# Patient Record
Sex: Male | Born: 1945 | Race: Black or African American | Hispanic: No | Marital: Married | Smoking: Former smoker
Health system: Southern US, Community
[De-identification: ages and names within clinical notes are randomized; demographics above are authoritative.]

## PROBLEM LIST (undated history)

## (undated) DIAGNOSIS — G20A1 Parkinson's disease without dyskinesia, without mention of fluctuations: Secondary | ICD-10-CM

## (undated) DIAGNOSIS — J3489 Other specified disorders of nose and nasal sinuses: Secondary | ICD-10-CM

## (undated) DIAGNOSIS — I10 Essential (primary) hypertension: Secondary | ICD-10-CM

## (undated) DIAGNOSIS — IMO0002 Reserved for concepts with insufficient information to code with codable children: Secondary | ICD-10-CM

## (undated) DIAGNOSIS — E119 Type 2 diabetes mellitus without complications: Secondary | ICD-10-CM

## (undated) DIAGNOSIS — M1990 Unspecified osteoarthritis, unspecified site: Secondary | ICD-10-CM

## (undated) DIAGNOSIS — B353 Tinea pedis: Secondary | ICD-10-CM

## (undated) DIAGNOSIS — C801 Malignant (primary) neoplasm, unspecified: Secondary | ICD-10-CM

## (undated) DIAGNOSIS — K5790 Diverticulosis of intestine, part unspecified, without perforation or abscess without bleeding: Secondary | ICD-10-CM

## (undated) DIAGNOSIS — E7800 Pure hypercholesterolemia, unspecified: Secondary | ICD-10-CM

## (undated) HISTORY — DX: Pure hypercholesterolemia, unspecified: E78.00

## (undated) HISTORY — PX: BACK SURGERY: SHX140

## (undated) HISTORY — DX: Diverticulosis of intestine, part unspecified, without perforation or abscess without bleeding: K57.90

## (undated) HISTORY — DX: Unspecified osteoarthritis, unspecified site: M19.90

## (undated) HISTORY — DX: Reserved for concepts with insufficient information to code with codable children: IMO0002

## (undated) HISTORY — DX: Tinea pedis: B35.3

---

## 1997-05-20 HISTORY — PX: EYE SURGERY: SHX253

## 2000-05-20 HISTORY — PX: PROSTATECTOMY: SHX69

## 2001-05-20 HISTORY — PX: DG DILATION URETERS: HXRAD351

## 2011-12-12 ENCOUNTER — Other Ambulatory Visit: Payer: Self-pay | Admitting: Neurosurgery

## 2011-12-12 DIAGNOSIS — M79606 Pain in leg, unspecified: Secondary | ICD-10-CM

## 2011-12-17 ENCOUNTER — Ambulatory Visit
Admission: RE | Admit: 2011-12-17 | Discharge: 2011-12-17 | Disposition: A | Discharge status: Home / Self Care | Payer: 59 | Source: Ambulatory Visit | Attending: Neurosurgery | Admitting: Neurosurgery

## 2011-12-17 VITALS — BP 101/49 | HR 67 | Ht 71.0 in | Wt 203.0 lb

## 2011-12-17 DIAGNOSIS — M79606 Pain in leg, unspecified: Secondary | ICD-10-CM

## 2011-12-17 MED ORDER — ONDANSETRON HCL 4 MG/2ML IJ SOLN: 4.0000 mg | Freq: Four times a day (QID) | INTRAMUSCULAR | Status: DC | PRN

## 2011-12-17 MED ORDER — IOHEXOL 180 MG/ML  SOLN
20.0000 mL | Freq: Once | INTRAMUSCULAR | Status: AC | PRN
  Administered 2011-12-17: 20 mL via INTRATHECAL

## 2011-12-17 MED ORDER — DIAZEPAM 5 MG PO TABS
10.0000 mg | ORAL_TABLET | Freq: Once | ORAL | Status: AC
  Administered 2011-12-17: 10 mg via ORAL

## 2011-12-17 NOTE — Progress Notes (Signed)
Pt states he has been off tramadol for the past 2 days. 

## 2012-10-20 DIAGNOSIS — H40019 Open angle with borderline findings, low risk, unspecified eye: Secondary | ICD-10-CM | POA: Diagnosis not present

## 2012-10-20 DIAGNOSIS — H52209 Unspecified astigmatism, unspecified eye: Secondary | ICD-10-CM | POA: Diagnosis not present

## 2012-10-20 DIAGNOSIS — E119 Type 2 diabetes mellitus without complications: Secondary | ICD-10-CM | POA: Diagnosis not present

## 2012-10-20 DIAGNOSIS — H2510 Age-related nuclear cataract, unspecified eye: Secondary | ICD-10-CM | POA: Diagnosis not present

## 2012-12-15 DIAGNOSIS — E119 Type 2 diabetes mellitus without complications: Secondary | ICD-10-CM | POA: Diagnosis not present

## 2012-12-15 DIAGNOSIS — Z125 Encounter for screening for malignant neoplasm of prostate: Secondary | ICD-10-CM | POA: Diagnosis not present

## 2012-12-15 DIAGNOSIS — E7800 Pure hypercholesterolemia, unspecified: Secondary | ICD-10-CM | POA: Diagnosis not present

## 2012-12-15 DIAGNOSIS — I10 Essential (primary) hypertension: Secondary | ICD-10-CM | POA: Diagnosis not present

## 2012-12-16 DIAGNOSIS — I10 Essential (primary) hypertension: Secondary | ICD-10-CM | POA: Diagnosis not present

## 2012-12-16 DIAGNOSIS — IMO0002 Reserved for concepts with insufficient information to code with codable children: Secondary | ICD-10-CM | POA: Diagnosis not present

## 2012-12-16 DIAGNOSIS — Z006 Encounter for examination for normal comparison and control in clinical research program: Secondary | ICD-10-CM | POA: Diagnosis not present

## 2012-12-16 DIAGNOSIS — Z0000 Encounter for general adult medical examination without abnormal findings: Secondary | ICD-10-CM | POA: Diagnosis not present

## 2012-12-16 DIAGNOSIS — M1710 Unilateral primary osteoarthritis, unspecified knee: Secondary | ICD-10-CM | POA: Diagnosis not present

## 2012-12-16 DIAGNOSIS — N529 Male erectile dysfunction, unspecified: Secondary | ICD-10-CM | POA: Diagnosis not present

## 2012-12-21 DIAGNOSIS — E119 Type 2 diabetes mellitus without complications: Secondary | ICD-10-CM | POA: Diagnosis not present

## 2012-12-21 DIAGNOSIS — Z8546 Personal history of malignant neoplasm of prostate: Secondary | ICD-10-CM | POA: Diagnosis not present

## 2012-12-21 DIAGNOSIS — M545 Low back pain, unspecified: Secondary | ICD-10-CM | POA: Diagnosis not present

## 2012-12-21 DIAGNOSIS — G250 Essential tremor: Secondary | ICD-10-CM | POA: Diagnosis not present

## 2013-03-09 DIAGNOSIS — I10 Essential (primary) hypertension: Secondary | ICD-10-CM | POA: Diagnosis not present

## 2013-04-26 DIAGNOSIS — Z0000 Encounter for general adult medical examination without abnormal findings: Secondary | ICD-10-CM | POA: Diagnosis not present

## 2013-04-26 DIAGNOSIS — E119 Type 2 diabetes mellitus without complications: Secondary | ICD-10-CM | POA: Diagnosis not present

## 2013-04-26 DIAGNOSIS — E7800 Pure hypercholesterolemia, unspecified: Secondary | ICD-10-CM | POA: Diagnosis not present

## 2013-04-27 DIAGNOSIS — E7800 Pure hypercholesterolemia, unspecified: Secondary | ICD-10-CM | POA: Diagnosis not present

## 2013-04-27 DIAGNOSIS — J310 Chronic rhinitis: Secondary | ICD-10-CM | POA: Diagnosis not present

## 2013-04-27 DIAGNOSIS — E119 Type 2 diabetes mellitus without complications: Secondary | ICD-10-CM | POA: Diagnosis not present

## 2013-04-27 DIAGNOSIS — I10 Essential (primary) hypertension: Secondary | ICD-10-CM | POA: Diagnosis not present

## 2013-04-27 DIAGNOSIS — R040 Epistaxis: Secondary | ICD-10-CM | POA: Diagnosis not present

## 2013-06-08 DIAGNOSIS — R05 Cough: Secondary | ICD-10-CM | POA: Diagnosis not present

## 2013-06-08 DIAGNOSIS — J329 Chronic sinusitis, unspecified: Secondary | ICD-10-CM | POA: Diagnosis not present

## 2013-06-08 DIAGNOSIS — R059 Cough, unspecified: Secondary | ICD-10-CM | POA: Diagnosis not present

## 2013-07-05 DIAGNOSIS — E663 Overweight: Secondary | ICD-10-CM | POA: Diagnosis not present

## 2013-07-05 DIAGNOSIS — I10 Essential (primary) hypertension: Secondary | ICD-10-CM | POA: Diagnosis not present

## 2013-07-05 DIAGNOSIS — M545 Low back pain, unspecified: Secondary | ICD-10-CM | POA: Diagnosis not present

## 2013-08-16 DIAGNOSIS — E119 Type 2 diabetes mellitus without complications: Secondary | ICD-10-CM | POA: Diagnosis not present

## 2013-08-19 DIAGNOSIS — E119 Type 2 diabetes mellitus without complications: Secondary | ICD-10-CM | POA: Diagnosis not present

## 2013-12-01 DIAGNOSIS — R059 Cough, unspecified: Secondary | ICD-10-CM | POA: Diagnosis not present

## 2013-12-01 DIAGNOSIS — J0110 Acute frontal sinusitis, unspecified: Secondary | ICD-10-CM | POA: Diagnosis not present

## 2013-12-01 DIAGNOSIS — Z6827 Body mass index (BMI) 27.0-27.9, adult: Secondary | ICD-10-CM | POA: Diagnosis not present

## 2013-12-01 DIAGNOSIS — R05 Cough: Secondary | ICD-10-CM | POA: Diagnosis not present

## 2013-12-31 DIAGNOSIS — E119 Type 2 diabetes mellitus without complications: Secondary | ICD-10-CM | POA: Diagnosis not present

## 2013-12-31 DIAGNOSIS — H2510 Age-related nuclear cataract, unspecified eye: Secondary | ICD-10-CM | POA: Diagnosis not present

## 2013-12-31 DIAGNOSIS — H5200 Hypermetropia, unspecified eye: Secondary | ICD-10-CM | POA: Diagnosis not present

## 2014-01-06 DIAGNOSIS — M545 Low back pain, unspecified: Secondary | ICD-10-CM | POA: Diagnosis not present

## 2014-01-06 DIAGNOSIS — I10 Essential (primary) hypertension: Secondary | ICD-10-CM | POA: Diagnosis not present

## 2014-01-06 DIAGNOSIS — E119 Type 2 diabetes mellitus without complications: Secondary | ICD-10-CM | POA: Diagnosis not present

## 2014-01-06 DIAGNOSIS — Z125 Encounter for screening for malignant neoplasm of prostate: Secondary | ICD-10-CM | POA: Diagnosis not present

## 2014-01-06 DIAGNOSIS — M5137 Other intervertebral disc degeneration, lumbosacral region: Secondary | ICD-10-CM | POA: Diagnosis not present

## 2014-01-06 DIAGNOSIS — Z0000 Encounter for general adult medical examination without abnormal findings: Secondary | ICD-10-CM | POA: Diagnosis not present

## 2014-01-06 DIAGNOSIS — E7800 Pure hypercholesterolemia, unspecified: Secondary | ICD-10-CM | POA: Diagnosis not present

## 2014-01-06 DIAGNOSIS — Z6826 Body mass index (BMI) 26.0-26.9, adult: Secondary | ICD-10-CM | POA: Diagnosis not present

## 2014-01-11 DIAGNOSIS — M545 Low back pain, unspecified: Secondary | ICD-10-CM | POA: Diagnosis not present

## 2014-01-11 DIAGNOSIS — E119 Type 2 diabetes mellitus without complications: Secondary | ICD-10-CM | POA: Diagnosis not present

## 2014-01-11 DIAGNOSIS — E7800 Pure hypercholesterolemia, unspecified: Secondary | ICD-10-CM | POA: Diagnosis not present

## 2014-01-11 DIAGNOSIS — Z8546 Personal history of malignant neoplasm of prostate: Secondary | ICD-10-CM | POA: Diagnosis not present

## 2014-01-12 ENCOUNTER — Other Ambulatory Visit: Payer: Self-pay | Admitting: Neurosurgery

## 2014-01-12 DIAGNOSIS — M5137 Other intervertebral disc degeneration, lumbosacral region: Secondary | ICD-10-CM

## 2014-01-31 ENCOUNTER — Ambulatory Visit
Admission: RE | Admit: 2014-01-31 | Discharge: 2014-01-31 | Disposition: A | Discharge status: Home / Self Care | Payer: Medicare Other | Source: Ambulatory Visit | Attending: Neurosurgery | Admitting: Neurosurgery

## 2014-01-31 VITALS — BP 137/100 | HR 67

## 2014-01-31 DIAGNOSIS — M47817 Spondylosis without myelopathy or radiculopathy, lumbosacral region: Secondary | ICD-10-CM | POA: Diagnosis not present

## 2014-01-31 DIAGNOSIS — M5137 Other intervertebral disc degeneration, lumbosacral region: Secondary | ICD-10-CM

## 2014-01-31 DIAGNOSIS — M4120 Other idiopathic scoliosis, site unspecified: Secondary | ICD-10-CM | POA: Diagnosis not present

## 2014-01-31 MED ORDER — DIAZEPAM 5 MG PO TABS
5.0000 mg | ORAL_TABLET | Freq: Once | ORAL | Status: AC
Start: 2014-01-31 — End: 2014-01-31
  Administered 2014-01-31: 5 mg via ORAL

## 2014-01-31 MED ORDER — IOHEXOL 180 MG/ML  SOLN
15.0000 mL | Freq: Once | INTRAMUSCULAR | Status: AC | PRN
  Administered 2014-01-31: 15 mL via INTRATHECAL

## 2014-01-31 NOTE — Discharge Instructions (Signed)

## 2014-02-03 DIAGNOSIS — M5137 Other intervertebral disc degeneration, lumbosacral region: Secondary | ICD-10-CM | POA: Diagnosis not present

## 2014-02-03 DIAGNOSIS — Z6826 Body mass index (BMI) 26.0-26.9, adult: Secondary | ICD-10-CM | POA: Diagnosis not present

## 2014-02-09 ENCOUNTER — Other Ambulatory Visit: Payer: Self-pay | Admitting: Neurosurgery

## 2014-02-09 DIAGNOSIS — M5136 Other intervertebral disc degeneration, lumbar region: Secondary | ICD-10-CM

## 2014-02-15 ENCOUNTER — Ambulatory Visit
Admission: RE | Admit: 2014-02-15 | Discharge: 2014-02-15 | Disposition: A | Discharge status: Home / Self Care | Payer: Medicare Other | Source: Ambulatory Visit | Attending: Neurosurgery | Admitting: Neurosurgery

## 2014-02-15 VITALS — BP 116/66 | HR 68

## 2014-02-15 DIAGNOSIS — M5126 Other intervertebral disc displacement, lumbar region: Secondary | ICD-10-CM

## 2014-02-15 DIAGNOSIS — M5136 Other intervertebral disc degeneration, lumbar region: Secondary | ICD-10-CM

## 2014-02-15 DIAGNOSIS — M545 Low back pain, unspecified: Secondary | ICD-10-CM | POA: Diagnosis not present

## 2014-02-15 DIAGNOSIS — IMO0002 Reserved for concepts with insufficient information to code with codable children: Secondary | ICD-10-CM | POA: Diagnosis not present

## 2014-02-15 MED ORDER — IOHEXOL 180 MG/ML  SOLN
1.0000 mL | Freq: Once | INTRAMUSCULAR | Status: AC | PRN
  Administered 2014-02-15: 1 mL via EPIDURAL

## 2014-02-15 MED ORDER — METHYLPREDNISOLONE ACETATE 40 MG/ML INJ SUSP (RADIOLOG
120.0000 mg | Freq: Once | INTRAMUSCULAR | Status: AC
  Administered 2014-02-15: 120 mg via EPIDURAL

## 2014-02-15 NOTE — Discharge Instructions (Signed)

## 2014-03-08 ENCOUNTER — Other Ambulatory Visit: Payer: Self-pay | Admitting: Neurosurgery

## 2014-03-08 DIAGNOSIS — M5136 Other intervertebral disc degeneration, lumbar region: Secondary | ICD-10-CM

## 2014-03-10 ENCOUNTER — Ambulatory Visit
Admission: RE | Admit: 2014-03-10 | Discharge: 2014-03-10 | Disposition: A | Discharge status: Home / Self Care | Payer: Medicare Other | Source: Ambulatory Visit | Attending: Neurosurgery | Admitting: Neurosurgery

## 2014-03-10 DIAGNOSIS — M545 Low back pain: Secondary | ICD-10-CM | POA: Diagnosis not present

## 2014-03-10 DIAGNOSIS — M5136 Other intervertebral disc degeneration, lumbar region: Secondary | ICD-10-CM

## 2014-03-10 MED ORDER — METHYLPREDNISOLONE ACETATE 40 MG/ML INJ SUSP (RADIOLOG
120.0000 mg | Freq: Once | INTRAMUSCULAR | Status: AC
  Administered 2014-03-10: 120 mg via EPIDURAL

## 2014-03-10 MED ORDER — IOHEXOL 180 MG/ML  SOLN
1.0000 mL | Freq: Once | INTRAMUSCULAR | Status: AC | PRN
  Administered 2014-03-10: 1 mL via EPIDURAL

## 2014-03-10 NOTE — Discharge Instructions (Signed)

## 2014-03-11 DIAGNOSIS — Z23 Encounter for immunization: Secondary | ICD-10-CM | POA: Diagnosis not present

## 2014-04-21 DIAGNOSIS — M5136 Other intervertebral disc degeneration, lumbar region: Secondary | ICD-10-CM | POA: Diagnosis not present

## 2014-04-21 DIAGNOSIS — Z6827 Body mass index (BMI) 27.0-27.9, adult: Secondary | ICD-10-CM | POA: Diagnosis not present

## 2014-04-25 ENCOUNTER — Other Ambulatory Visit: Payer: Self-pay | Admitting: Neurosurgery

## 2014-05-02 DIAGNOSIS — E119 Type 2 diabetes mellitus without complications: Secondary | ICD-10-CM | POA: Diagnosis not present

## 2014-05-02 DIAGNOSIS — I10 Essential (primary) hypertension: Secondary | ICD-10-CM | POA: Diagnosis not present

## 2014-05-02 DIAGNOSIS — M545 Low back pain: Secondary | ICD-10-CM | POA: Diagnosis not present

## 2014-05-02 DIAGNOSIS — E118 Type 2 diabetes mellitus with unspecified complications: Secondary | ICD-10-CM | POA: Diagnosis not present

## 2014-05-02 DIAGNOSIS — E780 Pure hypercholesterolemia: Secondary | ICD-10-CM | POA: Diagnosis not present

## 2014-05-24 ENCOUNTER — Encounter (HOSPITAL_COMMUNITY): Payer: Self-pay

## 2014-05-24 ENCOUNTER — Encounter (HOSPITAL_COMMUNITY)
Admission: RE | Admit: 2014-05-24 | Discharge: 2014-05-24 | Disposition: A | Discharge status: Home / Self Care | Payer: Medicare Other | Source: Ambulatory Visit | Attending: Neurosurgery | Admitting: Neurosurgery

## 2014-05-24 DIAGNOSIS — Z01818 Encounter for other preprocedural examination: Secondary | ICD-10-CM | POA: Diagnosis not present

## 2014-05-24 HISTORY — DX: Type 2 diabetes mellitus without complications: E11.9

## 2014-05-24 HISTORY — DX: Other specified disorders of nose and nasal sinuses: J34.89

## 2014-05-24 HISTORY — DX: Essential (primary) hypertension: I10

## 2014-05-24 HISTORY — DX: Malignant (primary) neoplasm, unspecified: C80.1

## 2014-05-24 HISTORY — DX: Unspecified osteoarthritis, unspecified site: M19.90

## 2014-05-24 LAB — TYPE AND SCREEN
ABO/RH(D): B POS
ANTIBODY SCREEN: NEGATIVE

## 2014-05-24 LAB — BASIC METABOLIC PANEL
ANION GAP: 10 (ref 5–15)
BUN: 13 mg/dL (ref 6–23)
CO2: 28 mmol/L (ref 19–32)
Calcium: 10.1 mg/dL (ref 8.4–10.5)
Chloride: 101 mEq/L (ref 96–112)
Creatinine, Ser: 1.19 mg/dL (ref 0.50–1.35)
GFR calc non Af Amer: 62 mL/min — ABNORMAL LOW (ref >90)
GFR, EST AFRICAN AMERICAN: 72 mL/min — AB (ref >90)
Glucose, Bld: 103 mg/dL — ABNORMAL HIGH (ref 70–99)
Potassium: 4 mmol/L (ref 3.5–5.1)
SODIUM: 139 mmol/L (ref 135–145)

## 2014-05-24 LAB — CBC
HEMATOCRIT: 45.5 % (ref 39.0–52.0)
HEMOGLOBIN: 14.7 g/dL (ref 13.0–17.0)
MCH: 29.5 pg (ref 26.0–34.0)
MCHC: 32.3 g/dL (ref 30.0–36.0)
MCV: 91.2 fL (ref 78.0–100.0)
Platelets: 207 10*3/uL (ref 150–400)
RBC: 4.99 MIL/uL (ref 4.22–5.81)
RDW: 12.1 % (ref 11.5–15.5)
WBC: 6.9 10*3/uL (ref 4.0–10.5)

## 2014-05-24 LAB — SURGICAL PCR SCREEN
MRSA, PCR: NEGATIVE
Staphylococcus aureus: NEGATIVE

## 2014-05-24 LAB — ABO/RH: ABO/RH(D): B POS

## 2014-05-24 NOTE — Pre-Procedure Instructions (Signed)
Aleczander Fandino  05/24/2014   Your procedure is scheduled on:  Wednesday, June 01, 2014  Report to [facility] Admitting at 6:30 AM.  Call this number if you have problems the morning of surgery: [phone]   Remember:   Do not eat food or drink liquids after midnight Tuesday, May 31, 2014   Take these medicines the morning of surgery with A SIP OF WATER: if needed: traMADol (ULTRAM)   for pain, fluticasone (FLONASE)  nasal spray for allergies   DO NOT TAKE ANY DIABETIC MEDICATION THE MORNING OF PROCEDURE such as  (Canagliflozin-Metformin HCl (INVOKAMET)  Stop taking Aspirin, vitamins, and herbal medications. Do not take any NSAIDs ie: Ibuprofen, Advil, Naproxen or any medication containing Aspirin such as Meloxicam (MOBIC); stop 1 week prior to procedure ( Wednesday, May 25, 2014).   Do not wear jewelry, make-up or nail polish.  Do not wear lotions, powders, or perfumes. You may not wear deodorant.  Do not shave 48 hours prior to surgery. Men may shave face and neck.  Do not bring valuables to the hospital.  [facility] is not responsible for any belongings or valuables.               Contacts, dentures or bridgework may not be worn into surgery.  Leave suitcase in the car. After surgery it may be brought to your room.  For patients admitted to the hospital, discharge time is determined by your treatment team.               Patients discharged the day of surgery will not be allowed to drive home.  Name and phone number of your driver:   Special Instructions:  Special Instructions:Special Instructions: [facility] - Preparing for Surgery  Before surgery, you can play an important role.  Because skin is not sterile, your skin needs to be as free of germs as possible.  You can reduce the number of germs on you skin by washing with CHG (chlorahexidine gluconate) soap before surgery.  CHG is an antiseptic cleaner which kills germs and bonds with the skin to continue  killing germs even after washing.  Please DO NOT use if you have an allergy to CHG or antibacterial soaps.  If your skin becomes reddened/irritated stop using the CHG and inform your nurse when you arrive at Short Stay.  Do not shave (including legs and underarms) for at least 48 hours prior to the first CHG shower.  You may shave your face.  Please follow these instructions carefully:   1.  Shower with CHG Soap the night before surgery and the morning of Surgery.  2.  If you choose to wash your hair, wash your hair first as usual with your normal shampoo.  3.  After you shampoo, rinse your hair and body thoroughly to remove the Shampoo.  4.  Use CHG as you would any other liquid soap.  You can apply chg directly  to the skin and wash gently with scrungie or a clean washcloth.  5.  Apply the CHG Soap to your body ONLY FROM THE NECK DOWN.  Do not use on open wounds or open sores.  Avoid contact with your eyes, ears, mouth and genitals (private parts).  Wash genitals (private parts) with your normal soap.  6.  Wash thoroughly, paying special attention to the area where your surgery will be performed.  7.  Thoroughly rinse your body with warm water from the neck down.  8.  DO NOT shower/wash with your normal soap after using and rinsing off the CHG Soap.  9.  Pat yourself dry with a clean towel.            10.  Wear clean pajamas.            11.  Place clean sheets on your bed the night of your first shower and do not sleep with pets.  Day of Surgery  Do not apply any lotions/deodorants the morning of surgery.  Please wear clean clothes to the hospital/surgery center.   Please read over the following fact sheets that you were given: Pain Booklet, Coughing and Deep Breathing, Blood Transfusion Information, MRSA Information and Surgical Site Infection Prevention

## 2014-05-31 MED ORDER — CEFAZOLIN SODIUM-DEXTROSE 2-3 GM-% IV SOLR
2.0000 g | INTRAVENOUS | Status: AC
  Administered 2014-06-01: 2 g via INTRAVENOUS
  Filled 2014-05-31: qty 50

## 2014-05-31 NOTE — H&P (Signed)
Tyler Hayes is an 69 y.o. male.   Chief Complaint: chronic lumbar pain HPI: patient who in the past underwent lumbar laminectomy in [place]. Off and on he has been complaining of lumbar pain with radiation yo both legs.  Lately he is having burning sensation and now is up to the point than ambulating about 10 feet increases the  Pain . Epidural injections has been of no help Past Medical History  Diagnosis Date  . Hypertension   . Sinus drainage   . Diabetes mellitus without complication     on meds  . Arthritis     back  . Cancer     prostate    Past Surgical History  Procedure Laterality Date  . Prostatectomy  2002  . Eye surgery Bilateral 1999    Lasik  . Back surgery  1984, 2013    lumbar  . Dg dilation ureters  2003    No family history on file. Social History:  reports that he quit smoking about 23 years ago. His smoking use included Cigarettes. He has a 30 pack-year smoking history. He has never used smokeless tobacco. He reports that he drinks alcohol. He reports that he does not use illicit drugs.  Allergies: No Known Allergies  No prescriptions prior to admission    No results found for this or any previous visit (from the past 48 hour(s)). No results found.  Review of Systems  Constitutional: Negative.   HENT: Negative.   Eyes: Negative.   Respiratory: Negative.   Cardiovascular: Negative.   Gastrointestinal: Negative.   Genitourinary: Negative.   Musculoskeletal: Positive for back pain.  Skin: Negative.   Neurological: Positive for sensory change and focal weakness.  Endo/Heme/Allergies: Negative.   Psychiatric/Behavioral: Negative.     There were no vitals taken for this visit. Physical Exam  Hent, nl. Neck, nl. Cv, nl. Lungs, clear. Abdomen, soft. Extremities, nl. NEUR0 decrease of lumbar flexibility.femoral stretch maveuver is positive bilaterally.SLR positive at 60 degrees. 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 Multiple facet arthropathy Assessment/Plan Patient to go ahead with decompression tomorrow at l34,45 5s1. A decision is to be made about the need of fusion based on the surgical findings.althought he is schedule for fusion if i can avoid it it will be better for him  Adamariz Gillott M 05/31/2014, 5:53 PM

## 2014-06-01 ENCOUNTER — Encounter (HOSPITAL_COMMUNITY): Payer: Self-pay | Admitting: Certified Registered"

## 2014-06-01 ENCOUNTER — Inpatient Hospital Stay (HOSPITAL_COMMUNITY): Payer: Medicare Other | Admitting: Certified Registered"

## 2014-06-01 ENCOUNTER — Inpatient Hospital Stay (HOSPITAL_COMMUNITY)
Admission: RE | Admit: 2014-06-01 | Discharge: 2014-06-04 | DRG: 460 | Disposition: A | Discharge status: Home / Self Care | Payer: Medicare Other | Source: Ambulatory Visit | Attending: Neurosurgery | Admitting: Neurosurgery

## 2014-06-01 ENCOUNTER — Inpatient Hospital Stay (HOSPITAL_COMMUNITY): Payer: Medicare Other

## 2014-06-01 ENCOUNTER — Encounter (HOSPITAL_COMMUNITY)
Admission: RE | Disposition: A | Discharge status: Home / Self Care | Payer: Self-pay | Source: Ambulatory Visit | Attending: Neurosurgery

## 2014-06-01 DIAGNOSIS — M1990 Unspecified osteoarthritis, unspecified site: Secondary | ICD-10-CM | POA: Diagnosis present

## 2014-06-01 DIAGNOSIS — M4326 Fusion of spine, lumbar region: Secondary | ICD-10-CM | POA: Diagnosis not present

## 2014-06-01 DIAGNOSIS — I10 Essential (primary) hypertension: Secondary | ICD-10-CM | POA: Diagnosis not present

## 2014-06-01 DIAGNOSIS — M419 Scoliosis, unspecified: Secondary | ICD-10-CM | POA: Diagnosis present

## 2014-06-01 DIAGNOSIS — M5136 Other intervertebral disc degeneration, lumbar region: Secondary | ICD-10-CM | POA: Diagnosis present

## 2014-06-01 DIAGNOSIS — Z79899 Other long term (current) drug therapy: Secondary | ICD-10-CM | POA: Diagnosis not present

## 2014-06-01 DIAGNOSIS — M545 Low back pain: Secondary | ICD-10-CM | POA: Diagnosis not present

## 2014-06-01 DIAGNOSIS — Z8546 Personal history of malignant neoplasm of prostate: Secondary | ICD-10-CM | POA: Diagnosis not present

## 2014-06-01 DIAGNOSIS — M5126 Other intervertebral disc displacement, lumbar region: Secondary | ICD-10-CM | POA: Diagnosis not present

## 2014-06-01 DIAGNOSIS — Z87891 Personal history of nicotine dependence: Secondary | ICD-10-CM

## 2014-06-01 DIAGNOSIS — M5116 Intervertebral disc disorders with radiculopathy, lumbar region: Secondary | ICD-10-CM | POA: Diagnosis not present

## 2014-06-01 DIAGNOSIS — Z9889 Other specified postprocedural states: Secondary | ICD-10-CM | POA: Diagnosis not present

## 2014-06-01 DIAGNOSIS — M4156 Other secondary scoliosis, lumbar region: Secondary | ICD-10-CM | POA: Diagnosis not present

## 2014-06-01 DIAGNOSIS — M51369 Other intervertebral disc degeneration, lumbar region without mention of lumbar back pain or lower extremity pain: Secondary | ICD-10-CM | POA: Diagnosis present

## 2014-06-01 DIAGNOSIS — M4800 Spinal stenosis, site unspecified: Secondary | ICD-10-CM | POA: Diagnosis present

## 2014-06-01 DIAGNOSIS — E119 Type 2 diabetes mellitus without complications: Secondary | ICD-10-CM | POA: Diagnosis present

## 2014-06-01 DIAGNOSIS — M5117 Intervertebral disc disorders with radiculopathy, lumbosacral region: Secondary | ICD-10-CM | POA: Diagnosis not present

## 2014-06-01 HISTORY — PX: POSTERIOR LUMBAR FUSION 4 LEVEL: SHX6037

## 2014-06-01 LAB — GLUCOSE, CAPILLARY
GLUCOSE-CAPILLARY: 115 mg/dL — AB (ref 70–99)
GLUCOSE-CAPILLARY: 132 mg/dL — AB (ref 70–99)
GLUCOSE-CAPILLARY: 101 mg/dL — AB (ref 70–99)

## 2014-06-01 SURGERY — POSTERIOR LUMBAR FUSION 4 LEVEL
Anesthesia: General

## 2014-06-01 MED ORDER — PROPOFOL 10 MG/ML IV BOLUS
INTRAVENOUS | Status: DC | PRN
  Administered 2014-06-01: 160 mg via INTRAVENOUS

## 2014-06-01 MED ORDER — SUFENTANIL CITRATE 50 MCG/ML IV SOLN
INTRAVENOUS | Status: AC
  Filled 2014-06-01: qty 1

## 2014-06-01 MED ORDER — VITAMIN D 1000 UNITS PO TABS
1000.0000 [IU] | ORAL_TABLET | Freq: Every day | ORAL | Status: DC
  Administered 2014-06-02 – 2014-06-04 (×3): 1000 [IU] via ORAL
  Filled 2014-06-01 (×3): qty 1

## 2014-06-01 MED ORDER — ZOLPIDEM TARTRATE 5 MG PO TABS: 5.0000 mg | ORAL_TABLET | Freq: Every evening | ORAL | Status: DC | PRN

## 2014-06-01 MED ORDER — HYDROMORPHONE HCL 1 MG/ML IJ SOLN
0.2500 mg | INTRAMUSCULAR | Status: DC | PRN
  Administered 2014-06-01 (×4): 0.5 mg via INTRAVENOUS

## 2014-06-01 MED ORDER — LACTATED RINGERS IV SOLN
INTRAVENOUS | Status: DC | PRN
  Administered 2014-06-01 (×3): via INTRAVENOUS

## 2014-06-01 MED ORDER — ONDANSETRON HCL 4 MG/2ML IJ SOLN: 4.0000 mg | INTRAMUSCULAR | Status: DC | PRN

## 2014-06-01 MED ORDER — DEXAMETHASONE SODIUM PHOSPHATE 4 MG/ML IJ SOLN
INTRAMUSCULAR | Status: DC | PRN
  Administered 2014-06-01: 4 mg via INTRAVENOUS

## 2014-06-01 MED ORDER — SODIUM CHLORIDE 0.9 % IJ SOLN: 9.0000 mL | INTRAMUSCULAR | Status: DC | PRN

## 2014-06-01 MED ORDER — DEXTROSE 5 % IV SOLN
10.0000 mg | INTRAVENOUS | Status: DC | PRN
  Administered 2014-06-01: 20 ug/min via INTRAVENOUS

## 2014-06-01 MED ORDER — EPHEDRINE SULFATE 50 MG/ML IJ SOLN
INTRAMUSCULAR | Status: DC | PRN
  Administered 2014-06-01: 10 mg via INTRAVENOUS

## 2014-06-01 MED ORDER — ATORVASTATIN CALCIUM 10 MG PO TABS
10.0000 mg | ORAL_TABLET | Freq: Every day | ORAL | Status: DC
Start: 2014-06-01 — End: 2014-06-04
  Administered 2014-06-02 – 2014-06-04 (×3): 10 mg via ORAL
  Filled 2014-06-01 (×5): qty 1

## 2014-06-01 MED ORDER — DIAZEPAM 5 MG PO TABS
10.0000 mg | ORAL_TABLET | Freq: Four times a day (QID) | ORAL | Status: DC | PRN
  Administered 2014-06-01 – 2014-06-04 (×4): 10 mg via ORAL
  Filled 2014-06-01 (×6): qty 2

## 2014-06-01 MED ORDER — VECURONIUM BROMIDE 10 MG IV SOLR
INTRAVENOUS | Status: DC | PRN
  Administered 2014-06-01: 7 mg via INTRAVENOUS
  Administered 2014-06-01: 3 mg via INTRAVENOUS

## 2014-06-01 MED ORDER — VECURONIUM BROMIDE 10 MG IV SOLR
INTRAVENOUS | Status: AC
  Filled 2014-06-01: qty 10

## 2014-06-01 MED ORDER — VANCOMYCIN HCL 1000 MG IV SOLR
INTRAVENOUS | Status: AC
  Filled 2014-06-01: qty 2000

## 2014-06-01 MED ORDER — MIDAZOLAM HCL 5 MG/5ML IJ SOLN
INTRAMUSCULAR | Status: DC | PRN
  Administered 2014-06-01: 2 mg via INTRAVENOUS

## 2014-06-01 MED ORDER — LIDOCAINE HCL (CARDIAC) 20 MG/ML IV SOLN
INTRAVENOUS | Status: AC
  Filled 2014-06-01: qty 5

## 2014-06-01 MED ORDER — DIPHENHYDRAMINE HCL 12.5 MG/5ML PO ELIX: 12.5000 mg | ORAL_SOLUTION | Freq: Four times a day (QID) | ORAL | Status: DC | PRN

## 2014-06-01 MED ORDER — SODIUM CHLORIDE 0.9 % IJ SOLN
INTRAMUSCULAR | Status: AC
  Filled 2014-06-01: qty 10

## 2014-06-01 MED ORDER — LIDOCAINE HCL (CARDIAC) 20 MG/ML IV SOLN
INTRAVENOUS | Status: DC | PRN
  Administered 2014-06-01: 75 mg via INTRAVENOUS

## 2014-06-01 MED ORDER — ONDANSETRON HCL 4 MG/2ML IJ SOLN: 4.0000 mg | Freq: Four times a day (QID) | INTRAMUSCULAR | Status: DC | PRN

## 2014-06-01 MED ORDER — 0.9 % SODIUM CHLORIDE (POUR BTL) OPTIME
TOPICAL | Status: DC | PRN
  Administered 2014-06-01: 1000 mL

## 2014-06-01 MED ORDER — METFORMIN HCL 500 MG PO TABS
1000.0000 mg | ORAL_TABLET | Freq: Two times a day (BID) | ORAL | Status: DC
Start: 2014-06-01 — End: 2014-06-04
  Administered 2014-06-01 – 2014-06-04 (×6): 1000 mg via ORAL
  Filled 2014-06-01 (×6): qty 2

## 2014-06-01 MED ORDER — SODIUM CHLORIDE 0.9 % IV SOLN
INTRAVENOUS | Status: DC
  Administered 2014-06-01: 23:00:00 via INTRAVENOUS

## 2014-06-01 MED ORDER — SURGIFOAM 100 EX MISC
CUTANEOUS | Status: DC | PRN
  Administered 2014-06-01: 10:00:00 via TOPICAL

## 2014-06-01 MED ORDER — SUCCINYLCHOLINE CHLORIDE 20 MG/ML IJ SOLN
INTRAMUSCULAR | Status: AC
  Filled 2014-06-01: qty 1

## 2014-06-01 MED ORDER — ACETAMINOPHEN 650 MG RE SUPP: 650.0000 mg | RECTAL | Status: DC | PRN

## 2014-06-01 MED ORDER — HYDROCHLOROTHIAZIDE 25 MG PO TABS
25.0000 mg | ORAL_TABLET | Freq: Every day | ORAL | Status: DC
Start: 2014-06-01 — End: 2014-06-04
  Administered 2014-06-02 – 2014-06-04 (×3): 25 mg via ORAL
  Filled 2014-06-01 (×4): qty 1

## 2014-06-01 MED ORDER — ONDANSETRON HCL 4 MG/2ML IJ SOLN
INTRAMUSCULAR | Status: DC | PRN
  Administered 2014-06-01: 4 mg via INTRAVENOUS

## 2014-06-01 MED ORDER — ONDANSETRON HCL 4 MG/2ML IJ SOLN
INTRAMUSCULAR | Status: AC
  Filled 2014-06-01: qty 2

## 2014-06-01 MED ORDER — VITAMIN D-3 25 MCG (1000 UT) PO CAPS: 1000.0000 [IU] | ORAL_CAPSULE | Freq: Every day | ORAL | Status: DC

## 2014-06-01 MED ORDER — BUPIVACAINE LIPOSOME 1.3 % IJ SUSP
INTRAMUSCULAR | Status: DC | PRN
Start: 2014-06-01 — End: 2014-06-01
  Administered 2014-06-01: 20 mL

## 2014-06-01 MED ORDER — HYDROMORPHONE HCL 1 MG/ML IJ SOLN
INTRAMUSCULAR | Status: AC
  Filled 2014-06-01: qty 1

## 2014-06-01 MED ORDER — STERILE WATER FOR INJECTION IJ SOLN
INTRAMUSCULAR | Status: AC
  Filled 2014-06-01: qty 10

## 2014-06-01 MED ORDER — BUPIVACAINE LIPOSOME 1.3 % IJ SUSP
20.0000 mL | Freq: Once | INTRAMUSCULAR | Status: DC
  Filled 2014-06-01: qty 20

## 2014-06-01 MED ORDER — SODIUM CHLORIDE 0.9 % IJ SOLN: 3.0000 mL | INTRAMUSCULAR | Status: DC | PRN

## 2014-06-01 MED ORDER — THROMBIN 5000 UNITS EX SOLR
OROMUCOSAL | Status: DC | PRN
  Administered 2014-06-01: 09:00:00 via TOPICAL

## 2014-06-01 MED ORDER — OXYCODONE-ACETAMINOPHEN 5-325 MG PO TABS
2.0000 | ORAL_TABLET | ORAL | Status: DC | PRN
  Administered 2014-06-02 – 2014-06-04 (×8): 2 via ORAL
  Filled 2014-06-01 (×8): qty 2

## 2014-06-01 MED ORDER — DIPHENHYDRAMINE HCL 50 MG/ML IJ SOLN: 12.5000 mg | Freq: Four times a day (QID) | INTRAMUSCULAR | Status: DC | PRN

## 2014-06-01 MED ORDER — LOSARTAN POTASSIUM 50 MG PO TABS
100.0000 mg | ORAL_TABLET | Freq: Every day | ORAL | Status: DC
Start: 2014-06-01 — End: 2014-06-04
  Administered 2014-06-02 – 2014-06-04 (×3): 100 mg via ORAL
  Filled 2014-06-01 (×3): qty 2

## 2014-06-01 MED ORDER — CANAGLIFLOZIN 100 MG PO TABS
50.0000 mg | ORAL_TABLET | Freq: Two times a day (BID) | ORAL | Status: DC
Start: 2014-06-01 — End: 2014-06-04
  Administered 2014-06-01 – 2014-06-04 (×6): 50 mg via ORAL
  Filled 2014-06-01 (×8): qty 0.5

## 2014-06-01 MED ORDER — PHENYLEPHRINE HCL 10 MG/ML IJ SOLN
INTRAMUSCULAR | Status: AC
  Filled 2014-06-01: qty 1

## 2014-06-01 MED ORDER — ACETAMINOPHEN 325 MG PO TABS: 650.0000 mg | ORAL_TABLET | ORAL | Status: DC | PRN

## 2014-06-01 MED ORDER — LOSARTAN POTASSIUM-HCTZ 100-25 MG PO TABS: 1.0000 | ORAL_TABLET | Freq: Every day | ORAL | Status: DC

## 2014-06-01 MED ORDER — PHENYLEPHRINE HCL 10 MG/ML IJ SOLN
INTRAMUSCULAR | Status: DC | PRN
  Administered 2014-06-01: 80 ug via INTRAVENOUS
  Administered 2014-06-01 (×2): 40 ug via INTRAVENOUS
  Administered 2014-06-01: 80 ug via INTRAVENOUS

## 2014-06-01 MED ORDER — FENTANYL 10 MCG/ML IV SOLN
INTRAVENOUS | Status: DC
  Administered 2014-06-01 (×2): 90 ug via INTRAVENOUS
  Administered 2014-06-01: 14:00:00 via INTRAVENOUS
  Administered 2014-06-02: 90 ug via INTRAVENOUS
  Administered 2014-06-02: 135 ug via INTRAVENOUS
  Administered 2014-06-02: 180 ug via INTRAVENOUS
  Administered 2014-06-02: 150 ug via INTRAVENOUS
  Administered 2014-06-02: 0 ug via INTRAVENOUS
  Filled 2014-06-01 (×3): qty 50

## 2014-06-01 MED ORDER — SODIUM CHLORIDE 0.9 % IJ SOLN
3.0000 mL | Freq: Two times a day (BID) | INTRAMUSCULAR | Status: DC
  Administered 2014-06-03 – 2014-06-04 (×3): 3 mL via INTRAVENOUS

## 2014-06-01 MED ORDER — MENTHOL 3 MG MT LOZG
1.0000 | LOZENGE | OROMUCOSAL | Status: DC | PRN
  Filled 2014-06-01: qty 9

## 2014-06-01 MED ORDER — VANCOMYCIN HCL 1000 MG IV SOLR
INTRAVENOUS | Status: DC | PRN
  Administered 2014-06-01 (×2): 1000 mg

## 2014-06-01 MED ORDER — CEFAZOLIN SODIUM 1-5 GM-% IV SOLN
1.0000 g | Freq: Three times a day (TID) | INTRAVENOUS | Status: AC
  Administered 2014-06-01 (×2): 1 g via INTRAVENOUS
  Filled 2014-06-01 (×2): qty 50

## 2014-06-01 MED ORDER — PROPOFOL 10 MG/ML IV BOLUS
INTRAVENOUS | Status: AC
  Filled 2014-06-01: qty 20

## 2014-06-01 MED ORDER — CANAGLIFLOZIN-METFORMIN HCL 50-1000 MG PO TABS: 1.0000 | ORAL_TABLET | Freq: Two times a day (BID) | ORAL | Status: DC

## 2014-06-01 MED ORDER — MIDAZOLAM HCL 2 MG/2ML IJ SOLN
INTRAMUSCULAR | Status: AC
  Filled 2014-06-01: qty 2

## 2014-06-01 MED ORDER — PHENOL 1.4 % MT LIQD
1.0000 | OROMUCOSAL | Status: DC | PRN
  Filled 2014-06-01: qty 177

## 2014-06-01 MED ORDER — EPHEDRINE SULFATE 50 MG/ML IJ SOLN
INTRAMUSCULAR | Status: AC
  Filled 2014-06-01: qty 1

## 2014-06-01 MED ORDER — SUFENTANIL CITRATE 50 MCG/ML IV SOLN
INTRAVENOUS | Status: DC | PRN
  Administered 2014-06-01 (×2): 10 ug via INTRAVENOUS
  Administered 2014-06-01: 20 ug via INTRAVENOUS
  Administered 2014-06-01: 10 ug via INTRAVENOUS

## 2014-06-01 MED ORDER — NALOXONE HCL 0.4 MG/ML IJ SOLN: 0.4000 mg | INTRAMUSCULAR | Status: DC | PRN

## 2014-06-01 MED ORDER — FLUTICASONE PROPIONATE 50 MCG/ACT NA SUSP
1.0000 | Freq: Every day | NASAL | Status: DC | PRN
  Filled 2014-06-01: qty 16

## 2014-06-01 SURGICAL SUPPLY — 68 items
BENZOIN TINCTURE PRP APPL 2/3 (GAUZE/BANDAGES/DRESSINGS) ×2 IMPLANT
BLADE CLIPPER SURG (BLADE) ×2 IMPLANT
BUR ACORN 6.0 (BURR) ×2 IMPLANT
BUR MATCHSTICK NEURO 3.0 LAGG (BURR) ×2 IMPLANT
CANISTER SUCT 3000ML (MISCELLANEOUS) ×2 IMPLANT
CAP LOCKING THREADED (Cap) ×4 IMPLANT
CONT SPEC 4OZ CLIKSEAL STRL BL (MISCELLANEOUS) ×2 IMPLANT
COVER BACK TABLE 60X90IN (DRAPES) ×2 IMPLANT
DRAPE C-ARM 42X72 X-RAY (DRAPES) ×4 IMPLANT
DRAPE LAPAROTOMY 100X72X124 (DRAPES) ×2 IMPLANT
DRAPE MICROSCOPE LEICA (MISCELLANEOUS) ×2 IMPLANT
DRAPE POUCH INSTRU U-SHP 10X18 (DRAPES) ×2 IMPLANT
DRSG OPSITE POSTOP 4X8 (GAUZE/BANDAGES/DRESSINGS) ×2 IMPLANT
DRSG PAD ABDOMINAL 8X10 ST (GAUZE/BANDAGES/DRESSINGS) IMPLANT
DURAPREP 26ML APPLICATOR (WOUND CARE) ×2 IMPLANT
ELECT REM PT RETURN 9FT ADLT (ELECTROSURGICAL) ×2
ELECTRODE REM PT RTRN 9FT ADLT (ELECTROSURGICAL) ×1 IMPLANT
EVACUATOR 1/8 PVC DRAIN (DRAIN) IMPLANT
GAUZE SPONGE 4X4 12PLY STRL (GAUZE/BANDAGES/DRESSINGS) ×2 IMPLANT
GAUZE SPONGE 4X4 16PLY XRAY LF (GAUZE/BANDAGES/DRESSINGS) ×2 IMPLANT
GLOVE BIOGEL M 8.0 STRL (GLOVE) ×4 IMPLANT
GLOVE BIOGEL PI IND STRL 7.0 (GLOVE) ×2 IMPLANT
GLOVE BIOGEL PI IND STRL 8 (GLOVE) ×2 IMPLANT
GLOVE BIOGEL PI INDICATOR 7.0 (GLOVE) ×2
GLOVE BIOGEL PI INDICATOR 8 (GLOVE) ×2
GLOVE ECLIPSE 7.5 STRL STRAW (GLOVE) ×6 IMPLANT
GLOVE EXAM NITRILE LRG STRL (GLOVE) IMPLANT
GLOVE EXAM NITRILE MD LF STRL (GLOVE) IMPLANT
GLOVE EXAM NITRILE XL STR (GLOVE) IMPLANT
GLOVE EXAM NITRILE XS STR PU (GLOVE) IMPLANT
GOWN STRL REUS W/ TWL LRG LVL3 (GOWN DISPOSABLE) IMPLANT
GOWN STRL REUS W/ TWL XL LVL3 (GOWN DISPOSABLE) ×1 IMPLANT
GOWN STRL REUS W/TWL 2XL LVL3 (GOWN DISPOSABLE) ×2 IMPLANT
GOWN STRL REUS W/TWL LRG LVL3 (GOWN DISPOSABLE)
GOWN STRL REUS W/TWL XL LVL3 (GOWN DISPOSABLE) ×1
HEMOSTAT POWDER KIT SURGIFOAM (HEMOSTASIS) ×2 IMPLANT
KIT BASIN OR (CUSTOM PROCEDURE TRAY) ×2 IMPLANT
KIT INFUSE SMALL (Orthopedic Implant) ×2 IMPLANT
KIT ROOM TURNOVER OR (KITS) ×2 IMPLANT
MILL MEDIUM DISP (BLADE) ×2 IMPLANT
NEEDLE HYPO 18GX1.5 BLUNT FILL (NEEDLE) IMPLANT
NEEDLE HYPO 21X1.5 SAFETY (NEEDLE) ×2 IMPLANT
NEEDLE HYPO 25X1 1.5 SAFETY (NEEDLE) IMPLANT
NS IRRIG 1000ML POUR BTL (IV SOLUTION) ×2 IMPLANT
PACK LAMINECTOMY NEURO (CUSTOM PROCEDURE TRAY) ×2 IMPLANT
PAD ARMBOARD 7.5X6 YLW CONV (MISCELLANEOUS) ×6 IMPLANT
PATTIES SURGICAL .5 X1 (DISPOSABLE) ×2 IMPLANT
PATTIES SURGICAL .5 X3 (DISPOSABLE) IMPLANT
ROD SPINAL 35MM (Rod) ×2 IMPLANT
RUBBERBAND STERILE (MISCELLANEOUS) ×4 IMPLANT
SCREW CREO THREADED 5.5X45MM (Screw) ×4 IMPLANT
SPONGE LAP 4X18 X RAY DECT (DISPOSABLE) IMPLANT
SPONGE NEURO XRAY DETECT 1X3 (DISPOSABLE) IMPLANT
SPONGE SURGIFOAM ABS GEL 100 (HEMOSTASIS) ×2 IMPLANT
STRIP BIOACTIVE VITOSS 25X52X4 (Orthopedic Implant) ×2 IMPLANT
STRIP CLOSURE SKIN 1/2X4 (GAUZE/BANDAGES/DRESSINGS) ×2 IMPLANT
SUT VIC AB 1 CT1 18XBRD ANBCTR (SUTURE) ×2 IMPLANT
SUT VIC AB 1 CT1 8-18 (SUTURE) ×2
SUT VIC AB 2-0 CP2 18 (SUTURE) ×4 IMPLANT
SUT VIC AB 3-0 SH 8-18 (SUTURE) ×4 IMPLANT
SYR 20CC LL (SYRINGE) ×2 IMPLANT
SYR 20ML ECCENTRIC (SYRINGE) ×2 IMPLANT
SYR 5ML LL (SYRINGE) IMPLANT
TOWEL OR 17X24 6PK STRL BLUE (TOWEL DISPOSABLE) ×2 IMPLANT
TOWEL OR 17X26 10 PK STRL BLUE (TOWEL DISPOSABLE) ×2 IMPLANT
TRAY FOLEY CATH 14FRSI W/METER (CATHETERS) ×2 IMPLANT
TRAY FOLEY CATH 16FRSI W/METER (SET/KITS/TRAYS/PACK) IMPLANT
WATER STERILE IRR 1000ML POUR (IV SOLUTION) ×2 IMPLANT

## 2014-06-01 NOTE — Progress Notes (Signed)
Utilization review completed.  

## 2014-06-01 NOTE — Progress Notes (Signed)
Patient arrived to unit via stretcher from PACU.   Pt is alert and oriented with no distress noted. Patient oriented to room, call bell within reach, alarm set on bed pre protocol.Family is at bedside.  Vital signs are stable. Will continue to monitor. Tomma Rakers RN

## 2014-06-01 NOTE — Anesthesia Procedure Notes (Signed)
Procedure Name: Intubation Date/Time: 06/01/2014 8:40 AM Performed by: Melina Copa, Griselda Bramblett R Pre-anesthesia Checklist: Patient identified, Emergency Drugs available, Suction available, Patient being monitored and Timeout performed Patient Re-evaluated:Patient Re-evaluated prior to inductionOxygen Delivery Method: Circle system utilized Preoxygenation: Pre-oxygenation with 100% oxygen Intubation Type: IV induction Ventilation: Mask ventilation without difficulty Laryngoscope Size: Mac and 4 Grade View: Grade I Tube type: Oral Tube size: 8.0 mm Number of attempts: 1 Airway Equipment and Method: Stylet Placement Confirmation: ETT inserted through vocal cords under direct vision,  positive ETCO2 and breath sounds checked- equal and bilateral Secured at: 22 cm Tube secured with: Tape Dental Injury: Teeth and Oropharynx as per pre-operative assessment

## 2014-06-01 NOTE — Anesthesia Preprocedure Evaluation (Addendum)
Anesthesia Evaluation  Patient identified by MRN, date of birth, ID band Patient awake    Reviewed: Allergy & Precautions, NPO status , Patient's Chart, lab work & pertinent test results  Airway Mallampati: I  TM Distance: >3 FB Neck ROM: full    Dental  (+) Dental Advisory Given, Edentulous Lower, Edentulous Upper   Pulmonary former smoker,          Cardiovascular hypertension, Pt. on medications     Neuro/Psych    GI/Hepatic   Endo/Other  diabetes, Type 2  Renal/GU    H/o prostate CA    Musculoskeletal  (+) Arthritis -,   Abdominal   Peds  Hematology   Anesthesia Other Findings   Reproductive/Obstetrics                            Anesthesia Physical Anesthesia Plan  ASA: II  Anesthesia Plan: General   Post-op Pain Management:    Induction: Intravenous  Airway Management Planned: Oral ETT  Additional Equipment: Arterial line  Intra-op Plan:   Post-operative Plan: Extubation in OR  Informed Consent: I have reviewed the patients History and Physical, chart, labs and discussed the procedure including the risks, benefits and alternatives for the proposed anesthesia with the patient or authorized representative who has indicated his/her understanding and acceptance.   Dental advisory given  Plan Discussed with: CRNA, Anesthesiologist and Surgeon  Anesthesia Plan Comments:        Anesthesia Quick Evaluation

## 2014-06-01 NOTE — Transfer of Care (Signed)
Immediate Anesthesia Transfer of Care Note  Patient: Tyler Hayes  Procedure(s) Performed: Procedure(s) with comments: LUMBAR THREE TO FOUR, LUMBAR FOUR TO FIVE LAMINECTOMY,  RIGHT LUMBAR FUSION AT LUMBAR FOUR TO FIVE (N/A) - POSSIBLE L2-3 L3-4 L4-5 L5-S1 POSTERIOR LUMBAR INTERBODY FUSION  Patient Location: PACU  Anesthesia Type:General  Level of Consciousness: awake  Airway & Oxygen Therapy: Patient Spontanous Breathing and Patient connected to nasal cannula oxygen  Post-op Assessment: Report given to PACU RN, Post -op Vital signs reviewed and stable and Patient moving all extremities  Post vital signs: Reviewed and stable  Complications: No apparent anesthesia complications

## 2014-06-01 NOTE — Clinical Social Work Psychosocial (Signed)
CSW Consult Acknowledged:   CSW received a consult for SNF placement. CSW awaiting PT/OT evaluation to determine the appropriate level of care.      Tyler Hayes, MSW, LCSWA [phone]  

## 2014-06-01 NOTE — Anesthesia Postprocedure Evaluation (Signed)
Anesthesia Post Note  Patient: Tyler Hayes  Procedure(s) Performed: Procedure(s) (LRB): LUMBAR THREE TO FOUR, LUMBAR FOUR TO FIVE LAMINECTOMY,  RIGHT LUMBAR FUSION AT LUMBAR FOUR TO FIVE (N/A)  Anesthesia type: General  Patient location: PACU  Post pain: Pain level controlled and Adequate analgesia  Post assessment: Post-op Vital signs reviewed, Patient's Cardiovascular Status Stable, Respiratory Function Stable, Patent Airway and Pain level controlled  Last Vitals:  Filed Vitals:   06/01/14 1413  BP:   Pulse: 85  Temp:   Resp: 20    Post vital signs: Reviewed and stable  Level of consciousness: awake, alert  and oriented  Complications: No apparent anesthesia complications

## 2014-06-02 LAB — GLUCOSE, CAPILLARY
GLUCOSE-CAPILLARY: 100 mg/dL — AB (ref 70–99)
GLUCOSE-CAPILLARY: 118 mg/dL — AB (ref 70–99)
Glucose-Capillary: 144 mg/dL — ABNORMAL HIGH (ref 70–99)
Glucose-Capillary: 123 mg/dL — ABNORMAL HIGH (ref 70–99)
Glucose-Capillary: 119 mg/dL — ABNORMAL HIGH (ref 70–99)

## 2014-06-02 NOTE — Evaluation (Signed)
Physical Therapy Evaluation Patient Details Name: Tyler Hayes MRN: [id number] DOB: January 04, 1946 Today's Date: 06/02/2014   History of Present Illness  patient who in the past underwent lumbar laminectomy in [place]. Off and on he has been complaining of lumbar pain with radiation to both legs.  Now with decompression at L3/4,4/5 L5/S1.    Clinical Impression  Pt presents with above.  Demonstrates decreased mobility, generalized BLE weakness, and increased pain.  Tolerated ambulation in room and hallway with RW at S level.  Pt will continue to benefit from acute services to address deficits.  PT recommends HHPT for follow up at D/C to maximize pts safety and functional independence.     Follow Up Recommendations Home health PT    Equipment Recommendations  Rolling walker with 5" wheels;3in1 (PT)    Recommendations for Other Services OT consult     Precautions / Restrictions Precautions Precautions: Fall;Back Precaution Comments: educated on back precautions, provided handout Required Braces or Orthoses: Spinal Brace Spinal Brace: Lumbar corset;Applied in sitting position Restrictions Weight Bearing Restrictions: No      Mobility  Bed Mobility Overal bed mobility: Needs Assistance Bed Mobility: Sidelying to Sit;Sit to Sidelying   Sidelying to sit: Supervision     Sit to sidelying: Min assist General bed mobility comments: Pt able to get into sitting position with HOB flat and without handrail, however min cues for maintaining back precautions.  Requires min A for guiding of LEs into bed back to SL position.  Pt requesting to maintain SL position, therefore placed pillow between LEs.   Transfers Overall transfer level: Needs assistance Equipment used: Rolling walker (2 wheeled) Transfers: Sit to/from Stand Sit to Stand: Min guard;Supervision         General transfer comment: Provided min/guard initially for safety, however pt able to perform at S level from bedside  commode over toilet and again from bed.  Min cues for hand placement and maintaining back precautions throughout.   Ambulation/Gait Ambulation/Gait assistance: Supervision Ambulation Distance (Feet): 100 Feet (then another 20' in room to/from restroom.) Assistive device: Rolling walker (2 wheeled) Gait Pattern/deviations: Step-through pattern;Decreased stride length;Antalgic;Trunk flexed Gait velocity: decreased   General Gait Details: Provided pt with cues for more upright posture, maintaining position inside of RW, keeping RW and LEs moving together instead of in block motions.    Stairs            Wheelchair Mobility    Modified Rankin (Stroke Patients Only)       Balance Overall balance assessment: Needs assistance Sitting-balance support: Feet supported Sitting balance-Leahy Scale: Good     Standing balance support: No upper extremity supported;During functional activity Standing balance-Leahy Scale: Fair Standing balance comment: Pt able to stand at sink and wash/dry hands without UE support at close S level.                              Pertinent Vitals/Pain Pain Assessment: 0-10 Pain Score: 4  Pain Location: low back Pain Descriptors / Indicators: Aching;Sore Pain Intervention(s): Monitored during session;Repositioned;PCA encouraged    Home Living Family/patient expects to be discharged to:: Private residence Living Arrangements: Spouse/significant other (and sister) Available Help at Discharge: Family;Available 24 hours/day Type of Home: House Home Access: Stairs to enter Entrance Stairs-Rails: Left Entrance Stairs-Number of Steps: 4 Home Layout: Two level;Able to live on main level with bedroom/bathroom   Additional Comments: has grab bar in shower and removable shower head, handicapped  height toilet seat, has walking stick that he would use occassionally    Prior Function Level of Independence: Independent;Independent with assistive  device(s)         Comments: Pt only needing walking stick intermittently     Hand Dominance        Extremity/Trunk Assessment               Lower Extremity Assessment: Generalized weakness;RLE deficits/detail;LLE deficits/detail RLE Deficits / Details: Pt with pain and weakness in B quad muscles during sitting and standing, relying heavily on UEs during mobility.   LLE Deficits / Details: Pt with pain and weakness in B quad muscles during sitting and standing, relying heavily on UEs during mobility.    Cervical / Trunk Assessment: Normal  Communication   Communication: No difficulties  Cognition Arousal/Alertness: Awake/alert Behavior During Therapy: WFL for tasks assessed/performed Overall Cognitive Status: Within Functional Limits for tasks assessed                      General Comments General comments (skin integrity, edema, etc.): Provided education on functional tasks and how to modify based on back precautions such as sleeping positions and how to avoid twisting and arching.  Educated that OT would also assist in AE to avoid those motions.     Exercises        Assessment/Plan    PT Assessment Patient needs continued PT services  PT Diagnosis Difficulty walking;Generalized weakness;Acute pain   PT Problem List Decreased strength;Decreased activity tolerance;Decreased balance;Decreased mobility;Decreased knowledge of use of DME;Decreased knowledge of precautions;Pain  PT Treatment Interventions DME instruction;Gait training;Stair training;Functional mobility training;Therapeutic activities;Therapeutic exercise;Balance training;Patient/family education   PT Goals (Current goals can be found in the Care Plan section) Acute Rehab PT Goals Patient Stated Goal: to return home PT Goal Formulation: With patient Time For Goal Achievement: 06/09/14 Potential to Achieve Goals: Good    Frequency Min 5X/week   Barriers to discharge        Co-evaluation                End of Session Equipment Utilized During Treatment: Back brace Activity Tolerance: Patient tolerated treatment well Patient left: in bed;with call bell/phone within reach Nurse Communication: Mobility status         Time: 1308-6578 PT Time Calculation (min) (ACUTE ONLY): 30 min   Charges:   PT Evaluation $Initial PT Evaluation Tier I: 1 Procedure PT Treatments $Gait Training: 8-22 mins $Therapeutic Activity: 8-22 mins   PT G Codes:        Denice Bors 06/02/2014, 9:24 AM

## 2014-06-02 NOTE — Progress Notes (Signed)
Patient ID: Tyler Hayes, male   DOB: Oct 28, 1945, 69 y.o.   MRN: [id number] Feels better, ambulating with pt. No weakness.

## 2014-06-02 NOTE — Evaluation (Addendum)
Occupational Therapy Evaluation Patient Details Name: Tyler Hayes MRN: [id number] DOB: 1945-11-08 Today's Date: 06/02/2014    History of Present Illness patient who in the past underwent lumbar laminectomy in [place]. Off and on he has been complaining of lumbar pain with radiation to both legs.  Now with decompression at L3/4,4/5 L5/S1.     Clinical Impression   Patient admitted with above. Patient independent to occasional mod I PTA. Patient currently requiring up to max assist for LB ADLs and supervising for rest of ADLs and functional mobility using RW and lumbar corset. Please see OT problem list below. Feel patient will benefit from acute OT to increase overall independence in the areas of ADLs & functional mobility and in order to safely discharge to venue listed below.      Follow Up Recommendations  No OT follow up;Supervision/Assistance - 24 hour    Equipment Recommendations  3 in 1 bedside comode  Addendum - Reacher, sock aid, LH shoe horn, LH sponge   Recommendations for Other Services  None at this time     Precautions / Restrictions Precautions Precautions: Fall;Back Precaution Comments: Patient able to verbalize 3/3 back precautions with verbal cues  Required Braces or Orthoses: Spinal Brace Spinal Brace: Lumbar corset;Applied in sitting position Restrictions Weight Bearing Restrictions: No      Mobility - Per PT evaluation Bed Mobility Overal bed mobility: Needs Assistance Bed Mobility: Sidelying to Sit;Sit to Sidelying   Sidelying to sit: Supervision     Sit to sidelying: Min assist General bed mobility comments: Pt able to get into sitting position with HOB flat and without handrail, however min cues for maintaining back precautions.  Requires min A for guiding of LEs into bed back to SL position.  Pt requesting to maintain SL position, therefore placed pillow between LEs.   Transfers Overall transfer level: Needs assistance Equipment used: Rolling  walker (2 wheeled) Transfers: Sit to/from Stand Sit to Stand: Min guard;Supervision         General transfer comment: Provided min/guard initially for safety, however pt able to perform at S level from bedside commode over toilet and again from bed.  Min cues for hand placement and maintaining back precautions throughout.     Balance - Per PT evaluation Overall balance assessment: Needs assistance Sitting-balance support: Feet supported Sitting balance-Leahy Scale: Good     Standing balance support: No upper extremity supported;During functional activity Standing balance-Leahy Scale: Fair Standing balance comment: Pt able to stand at sink and wash/dry hands without UE support at close S level.      ADL Overall ADL's : Needs assistance/impaired Eating/Feeding: Independent   Grooming: Supervision/safety;Standing;Cueing for safety   Upper Body Bathing: Supervision/ safety;Cueing for safety;Standing   Lower Body Bathing: Moderate assistance;Cueing for safety;Sit to/from stand   Upper Body Dressing : Supervision/safety;Sitting;Cueing for safety   Lower Body Dressing: Maximal assistance;Cueing for safety;Sit to/from stand   Toilet Transfer: Supervision/safety;Cueing for safety;BSC;Grab bars;RW           Functional mobility during ADLs: Supervision/safety;Rolling walker General ADL Comments: Patient requires up to max assist for LB ADLs, all other components patient is at a supervision level. Patient will benefit from use of AE. Plan to educate patient on their uses during next OT session.      Vision  Patient wears glasses for reading, no apparent deficts   Perception Perception Perception Tested?: No   Praxis Praxis Praxis tested?: Within functional limits    Pertinent Vitals/Pain Pain Assessment: No/denies pain Pain  Score: 4  Pain Location: low back Pain Descriptors / Indicators: Aching;Sore Pain Intervention(s): Monitored during session;Repositioned;PCA  encouraged     Hand Dominance Right   Extremity/Trunk Assessment Upper Extremity Assessment Upper Extremity Assessment: Overall WFL for tasks assessed   Lower Extremity Assessment Lower Extremity Assessment: Defer to PT evaluation RLE Deficits / Details: Pt with pain and weakness in B quad muscles during sitting and standing, relying heavily on UEs during mobility.   LLE Deficits / Details: Pt with pain and weakness in B quad muscles during sitting and standing, relying heavily on UEs during mobility.     Cervical / Trunk Assessment Cervical / Trunk Assessment: Normal   Communication Communication Communication: No difficulties   Cognition Arousal/Alertness: Awake/alert Behavior During Therapy: WFL for tasks assessed/performed Overall Cognitive Status: Within Functional Limits for tasks assessed              Home Living Family/patient expects to be discharged to:: Private residence Living Arrangements: Spouse/significant other (lives with wife) Available Help at Discharge: Family;Available 24 hours/day Type of Home: House Home Access: Stairs to enter [employer] of Steps: 4 Entrance Stairs-Rails: Left Home Layout: Two level;Able to live on main level with bedroom/bathroom     Bathroom Shower/Tub: Walk-in shower;Door   Bathroom Toilet: Handicapped height         Additional Comments: has grab bar in shower and removable shower head, handicapped height toilet seat, has walking stick that he would use occassionally  Lives With: Spouse;Family    Prior Functioning/Environment Level of Independence: Independent;Independent with assistive device(s)        Comments: Pt only needing walking stick intermittently    OT Diagnosis: Generalized weakness;Acute pain   OT Problem List: Decreased strength;Decreased activity tolerance;Impaired balance (sitting and/or standing);Decreased safety awareness;Decreased knowledge of use of DME or AE;Pain   OT  Treatment/Interventions: Self-care/ADL training;Therapeutic exercise;Energy conservation;DME and/or AE instruction;Therapeutic activities;Patient/family education;Balance training    OT Goals(Current goals can be found in the care plan section) Acute Rehab OT Goals Patient Stated Goal: to return home OT Goal Formulation: With patient Time For Goal Achievement: 06/09/14 Potential to Achieve Goals: Good  OT Frequency: Min 2X/week   Barriers to D/C: None known at this time          End of Session Equipment Utilized During Treatment: Rolling walker;Back brace  Activity Tolerance: Patient tolerated treatment well Patient left: in bed;with call bell/phone within reach   Time: 0950-1015 OT Time Calculation (min): 25 min Charges:  OT General Charges $OT Visit: 1 Procedure OT Evaluation $Initial OT Evaluation Tier I: 1 Procedure OT Treatments $Self Care/Home Management : 8-22 mins  Izaias Krupka , MS, OTR/L, CLT Pager: [phone]  06/02/2014, 11:39 AM

## 2014-06-02 NOTE — Op Note (Signed)
Tyler Hayes, Tyler Hayes                ACCOUNT NO.:  [id number]  MEDICAL RECORD NO.:  [id number]  LOCATION:  [id number]                        FACILITY:  [facility]  PHYSICIAN:  Leeroy Cha, M.D.   DATE OF BIRTH:  01/14/1946  DATE OF PROCEDURE:  06/01/2014 DATE OF DISCHARGE:                              OPERATIVE REPORT   PREOPERATIVE DIAGNOSES: 1. 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 2. Scoliosis. 3. Right lower radiculopathy, left upper radiculopathy. 4. Status post decompression on the right side, 4-5, 5-1.  POSTOPERATIVE DIAGNOSES: 1. 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 2. Scoliosis. 3. Right lower radiculopathy, left upper radiculopathy. 4. Status post decompression on the right side, 4-5, 5-1.  PROCEDURES: 1. Left 2-3, 3-4, 4-5, 5-1 foraminotomy, laminotomy, decompression of     the thecal sac and the nerve root. 2. Right L4 facetectomy. 3. Decompression of the L4 and L5 nerve root. 4. Pedicle screws at L4-L5, posterolateral arthrodesis with BMP     autograft Vitoss. 5. C-arm.  SURGEON:  Leeroy Cha, MD.  ASSISTANT:  Dr. Donnita Falls.  CLINICAL HISTORY:  The patient had been complaining of pain in the left leg proximal to the left thigh and in the right foot on the right side all the way down to the foot.  X-rays showed scoliosis, degenerative disk disease.  At the level of 2-3 to the left, he has a small herniated disk affecting the lateral aspect of the L2 nerve root.  At the level of 4-5 and 5-1, he has a spondylosis with foraminal narrowing.  I talked to the patient last night after I saw him in my office.  The plan was to fuse him, but upon talking to him, the pain was more specific to the left thigh and to the right foot.  Because of that, we at this morning, we changed the procedure.  The patient knew the risk with the surgery as well as the benefits.  DESCRIPTION OF PROCEDURE:  The  patient was taken to the OR.  After intubation, he was positioned in a prone manner.  The back was cleaned with Betadine and DuraPrep.  Then drapes were applied.  Midline incision following the previous one was made through the skin, subcutaneous tissue, muscle with retraction laterally and bilaterally.  With the x- ray, we were able to identify the area between 2-3 and 3-4.  From then on, at the level of 2-3, we did a laminotomy at 2-3 with removal of the yellow ligament and decompression of the L2-L3 nerve root.  The L2 was really tight laterally and with the Kerrison punch, we were able to open the foramen.  The same procedure was done at the level of 3-4 with laminotomy and foraminotomy at L4-5.  At L5-1, we found quite a bit of narrowing and decompression was achieved for the thecal sac and L5-S1 nerve root.  From then on, we knew that in the left side, we had decompression all the way from L2 down to S1.  In the right side, we investigated the area between 4-5, 5-1.  What we found that although the  facet was present, it was quite loose.  The x-rays showed that indeed there was no any evidence of spondylolisthesis, but the joint was loose. Because of that, I called Dr. Donnita Falls, and we agreed then that facet need to be replaced.  The facet was removed easily, and we were able to decompress the thecal sac as well as decompress the L4-L5 nerve root. Then, using the C-arm first in AP view, then in a lateral view, we made a hole in the pedicle of L4 and L5, and we were able to introduce 2 screws of 5.5 x 45.  Prior to introduction, we were sure that we were surrounded by bone.  The screws were kept in place with a rod and caps. We went laterally.  We removed the periosteum of the lateral aspect of the L4-5 and a mix of BMP autograft and Vitoss was used for arthrodesis. From then on, the area was irrigated.  Hemostasis was accomplished. Vancomycin powder was left in the operative site, and  the wound was closed with different layer of Vicryl and Steri-Strips.          ______________________________ Leeroy Cha, M.D.     EB/MEDQ  D:  06/01/2014  T:  06/01/2014  Job:  [id number]

## 2014-06-03 LAB — GLUCOSE, CAPILLARY
Glucose-Capillary: 100 mg/dL — ABNORMAL HIGH (ref 70–99)
Glucose-Capillary: 141 mg/dL — ABNORMAL HIGH (ref 70–99)
Glucose-Capillary: 115 mg/dL — ABNORMAL HIGH (ref 70–99)
Glucose-Capillary: 121 mg/dL — ABNORMAL HIGH (ref 70–99)

## 2014-06-03 MED ORDER — BISACODYL 10 MG RE SUPP
10.0000 mg | Freq: Once | RECTAL | Status: AC
  Administered 2014-06-03: 10 mg via RECTAL
  Filled 2014-06-03: qty 1

## 2014-06-03 NOTE — Progress Notes (Signed)
Occupational Therapy Treatment Patient Details Name: Tyler Hayes MRN: [id number] DOB: 10-18-1945 Today's Date: 06/03/2014    History of present illness patient who in the past underwent lumbar laminectomy in [place]. Off and on he has been complaining of lumbar pain with radiation to both legs.  Now with decompression at L3/4,4/5 L5/S1.     OT comments  Patient progressing towards goals, continue plan of care.   Follow Up Recommendations  No OT follow up;Supervision/Assistance - 24 hour    Equipment Recommendations  3 in 1 bedside comode;Other (comment) (reacher, sock aid, LH shoe horn, LH sponge)    Recommendations for Other Services  None at this time    Precautions / Restrictions Precautions Precautions: Fall;Back Precaution Comments: Patient able to verbalize and adhere to  3/3 back precautions with min verbal cues Required Braces or Orthoses: Spinal Brace Spinal Brace: Lumbar corset;Applied in sitting position Restrictions Weight Bearing Restrictions: No       Mobility Bed Mobility General bed mobility comments: Pt in recliner when OT arrived.   Transfers Overall transfer level: Needs assistance Equipment used: Rolling walker (2 wheeled) Transfers: Sit to/from Stand Sit to Stand: Supervision         General transfer comment: min cues for maintaining back precautions        ADL Overall ADL's : Needs assistance/impaired Eating/Feeding: Independent   Grooming: Supervision/safety;Standing   Upper Body Bathing: Cueing for safety;Supervision/ safety;Sitting   Lower Body Bathing: Minimal assistance;Sit to/from stand;Cueing for safety   Upper Body Dressing : Supervision/safety;Sitting;Cueing for safety   Lower Body Dressing: Minimal assistance;Sit to/from stand;Adhering to back precautions;Cueing for safety   Toilet Transfer: Supervision/safety;Cueing for safety;BSC;Grab bars;RW   Toileting- Clothing Manipulation and Hygiene: Supervision/safety;Sit  to/from stand;Cueing for safety       Functional mobility during ADLs: Supervision/safety;Rolling walker General ADL Comments: Patient with increased independence using AE. Patient receptive to [place] and eager to use at home. No family present, but patient stated his wife plans to purchase AE for use. Patient required min verbal cues to adhere to back precautions during session, but overall did better than yesterday.        Cognition   Behavior During Therapy: WFL for tasks assessed/performed Overall Cognitive Status: Within Functional Limits for tasks assessed                 Pertinent Vitals/ Pain       Pain Assessment: 0-10 Pain Score: 3  Pain Location: back Pain Descriptors / Indicators: Aching Pain Intervention(s): Monitored during session         Frequency Min 2X/week     Progress Toward Goals  OT Goals(current goals can now be found in the care plan section)  Progress towards OT goals: Progressing toward goals  Acute Rehab OT Goals Patient Stated Goal: to return home  Plan Discharge plan remains appropriate       End of Session Equipment Utilized During Treatment: Rolling walker;Back brace, reacher and sock aid   Activity Tolerance Patient tolerated treatment well   Patient Left in chair;with call bell/phone within reach     Time: 4193-7902 OT Time Calculation (min): 32 min  Charges: OT General Charges $OT Visit: 1 Procedure OT Treatments $Self Care/Home Management : 23-37 mins  Natayla Cadenhead , MS, OTR/L, CLT Pager: [phone]  06/03/2014, 11:34 AM

## 2014-06-03 NOTE — Progress Notes (Signed)
Attending MD does not want any [place] for his patient only DME - rolling walker and 3:1; [facility] called to have equipment delivered to the room prior to discharge home todayAneta Mins [phone]

## 2014-06-03 NOTE — Plan of Care (Signed)
Problem: Consults Goal: Diagnosis - Spinal Surgery Outcome: Completed/Met Date Met:  06/03/14 SPINAL SURGERY

## 2014-06-03 NOTE — Progress Notes (Signed)
Physical Therapy Treatment Patient Details Name: Tyler Hayes MRN: [id number] DOB: 03-May-1946 Today's Date: 06/03/2014    History of Present Illness patient who in the past underwent lumbar laminectomy in [place]. Off and on he has been complaining of lumbar pain with radiation to both legs.  Now with decompression at L3/4,4/5 L5/S1.      PT Comments    Pt making good progress with all mobility and performed stairs to simulate home entry today at min A level.  Provided handouts and education to wife on back precautions and how to assist with stairs, as well as where to find AE.    Follow Up Recommendations  Home health PT     Equipment Recommendations  Rolling walker with 5" wheels;3in1 (PT)    Recommendations for Other Services       Precautions / Restrictions Precautions Precautions: Fall;Back Precaution Comments: Patient able to verbalize 3/3 back precautions with verbal cues  Required Braces or Orthoses: Spinal Brace Spinal Brace: Lumbar corset;Applied in sitting position Restrictions Weight Bearing Restrictions: No    Mobility  Bed Mobility               General bed mobility comments: Pt in recliner when PT arrived.   Transfers Overall transfer level: Needs assistance Equipment used: Rolling walker (2 wheeled) Transfers: Sit to/from Stand Sit to Stand: Supervision         General transfer comment: min cues for maintaining back precautions  Ambulation/Gait Ambulation/Gait assistance: Supervision Ambulation Distance (Feet): 200 Feet Assistive device: Rolling walker (2 wheeled) Gait Pattern/deviations: Step-through pattern;Decreased stride length;Trunk flexed Gait velocity: decreased   General Gait Details: Min cues for upright and relaxed posture, as he tends to use accessory neck mm when ambulating.    Stairs Stairs: Yes Stairs assistance: Min assist Stair Management: One rail Left;Step to pattern;Forwards Number of Stairs: 4 General stair  comments: Provided cues for safe stepping technique and how to have family assist on R side with HHA.   Wheelchair Mobility    Modified Rankin (Stroke Patients Only)       Balance                                    Cognition Arousal/Alertness: Awake/alert Behavior During Therapy: WFL for tasks assessed/performed Overall Cognitive Status: Within Functional Limits for tasks assessed                      Exercises      General Comments General comments (skin integrity, edema, etc.): Provided new handout on back precautions per wife's request and also provided handout on AE and where to find in gift shop or medical supply store.       Pertinent Vitals/Pain Pain Assessment: 0-10 Pain Score: 7  Pain Location: low back Pain Descriptors / Indicators: Aching Pain Intervention(s): RN gave pain meds during session;Repositioned    Home Living                      Prior Function            PT Goals (current goals can now be found in the care plan section) Acute Rehab PT Goals Patient Stated Goal: to return home PT Goal Formulation: With patient Time For Goal Achievement: 06/09/14 Potential to Achieve Goals: Good Progress towards PT goals: Progressing toward goals    Frequency  Min 5X/week    PT  Plan Current plan remains appropriate    Co-evaluation             End of Session Equipment Utilized During Treatment: Back brace Activity Tolerance: Patient tolerated treatment well Patient left: in chair;with call bell/phone within reach;with family/visitor present     Time: 7793-9030 PT Time Calculation (min) (ACUTE ONLY): 25 min  Charges:  $Gait Training: 23-37 mins                    G Codes:      Denice Bors 06/03/2014, 9:31 AM

## 2014-06-03 NOTE — Discharge Summary (Signed)
Physician Discharge Summary  Patient ID: Tyler Hayes MRN: [id number] DOB/AGE: 1946-02-15 69 y.o.  Admit date: 06/01/2014 Discharge date: 06/03/2014  Admission Diagnoses:lumbar radiculopathy, foraminal stenosis  Discharge Diagnoses:  Active Problems:   Lumbar degenerative disc disease   Discharged Condition: no pain  Hospital Course: surgery  Consults: none  Significant Diagnostic Studies: myelogram  Treatments: decompression- fusion  Discharge Exam: Blood pressure 109/70, pulse 106, temperature 98.7 F (37.1 C), temperature source Oral, resp. rate 20, height 5' 10.5" (1.791 m), weight 87.136 kg (192 lb 1.6 oz), SpO2 96 %. ambulating  Disposition: home     Medication List    ASK your doctor about these medications        atorvastatin 10 MG tablet  Commonly known as:  LIPITOR  Take 10 mg by mouth daily.     fluticasone 50 MCG/ACT nasal spray  Commonly known as:  FLONASE  Place 1 spray into both nostrils daily as needed for allergies or rhinitis.     INVOKAMET 50-1000 MG Tabs  Generic drug:  Canagliflozin-Metformin HCl  Take 1 tablet by mouth 2 (two) times daily.     losartan-hydrochlorothiazide 100-25 MG per tablet  Commonly known as:  HYZAAR  Take 1 tablet by mouth daily.     meloxicam 15 MG tablet  Commonly known as:  MOBIC  Take 15 mg by mouth daily as needed for pain.     montelukast 10 MG tablet  Commonly known as:  SINGULAIR  Take 10 mg by mouth every evening.     traMADol 50 MG tablet  Commonly known as:  ULTRAM  Take 50 mg by mouth every 6 (six) hours as needed (for pain).     Vitamin D-3 1000 UNITS Caps  Take 1,000 Units by mouth daily.         Signed: Floyce Stakes 06/03/2014, 10:09 AM

## 2014-06-03 NOTE — Progress Notes (Signed)
Offered pt bisacodyl pr. Administered at bedside, no acute distress. Pt states that he wishes to dc home after treatment. Asked pt to remain in bed for result until after lunch.

## 2014-06-04 LAB — GLUCOSE, CAPILLARY: Glucose-Capillary: 105 mg/dL — ABNORMAL HIGH (ref 70–99)

## 2014-06-04 NOTE — Progress Notes (Signed)
Patient ID: Tyler Hayes, male   DOB: Feb 08, 1946, 69 y.o.   MRN: [id number] Doing well. No pain in legs. Home today

## 2014-06-04 NOTE — Progress Notes (Signed)
Patient [name] home via car with wife.  DC instructions and prescriptions were given to patient and wife.  Vital signs and assessments were stable.

## 2014-06-04 NOTE — Progress Notes (Signed)
Physical Therapy Treatment Patient Details Name: Tyler Hayes MRN: [id number] DOB: 09/23/1945 Today's Date: 06/04/2014    History of Present Illness patient who in the past underwent lumbar laminectomy in [place]. Off and on he has been complaining of lumbar pain with radiation to both legs.  Now with decompression at L3/4,4/5 L5/S1.      PT Comments    Patient progressing well. Reviewed precautions and handout. Patient safe to D/C from a mobility standpoint based on progression towards goals set on PT eval.    Follow Up Recommendations  Home health PT     Equipment Recommendations  Rolling walker with 5" wheels;3in1 (PT)    Recommendations for Other Services       Precautions / Restrictions Precautions Precautions: Fall;Back Precaution Comments: Patient able to verbalize and adhere to  3/3 back precautions and reviewed back handout Required Braces or Orthoses: Spinal Brace Spinal Brace: Lumbar corset;Applied in sitting position Restrictions Weight Bearing Restrictions: No    Mobility  Bed Mobility       Sidelying to sit: Supervision     Sit to sidelying: Supervision    Transfers Overall transfer level: Modified independent                  Ambulation/Gait Ambulation/Gait assistance: Modified independent (Device/Increase time) Ambulation Distance (Feet): 300 Feet Assistive device: Rolling walker (2 wheeled) Gait Pattern/deviations: Step-through pattern         Stairs Stairs: Yes Stairs assistance: Min guard Stair Management: Step to pattern;One rail Left Number of Stairs: 5 General stair comments: Patient with safe technique  Wheelchair Mobility    Modified Rankin (Stroke Patients Only)       Balance                                    Cognition Arousal/Alertness: Awake/alert Behavior During Therapy: WFL for tasks assessed/performed Overall Cognitive Status: Within Functional Limits for tasks assessed                      Exercises      General Comments        Pertinent Vitals/Pain Pain Assessment: No/denies pain    Home Living                      Prior Function            PT Goals (current goals can now be found in the care plan section) Progress towards PT goals: Progressing toward goals    Frequency  Min 5X/week    PT Plan Current plan remains appropriate    Co-evaluation             End of Session Equipment Utilized During Treatment: Back brace Activity Tolerance: Patient tolerated treatment well Patient left: in chair;with call bell/phone within reach     Time: 0820-0845 PT Time Calculation (min) (ACUTE ONLY): 25 min  Charges:  $Gait Training: 8-22 mins $Therapeutic Activity: 8-22 mins                    G Codes:      Jacqualyn Posey 06/04/2014, 8:48 AM  06/04/2014 Jacqualyn Posey PTA [phone] pager [phone] office

## 2014-06-06 ENCOUNTER — Encounter (HOSPITAL_COMMUNITY): Payer: Self-pay | Admitting: Neurosurgery

## 2014-06-27 DIAGNOSIS — M5136 Other intervertebral disc degeneration, lumbar region: Secondary | ICD-10-CM | POA: Diagnosis not present

## 2014-07-18 DIAGNOSIS — Z6826 Body mass index (BMI) 26.0-26.9, adult: Secondary | ICD-10-CM | POA: Diagnosis not present

## 2014-07-18 DIAGNOSIS — M5136 Other intervertebral disc degeneration, lumbar region: Secondary | ICD-10-CM | POA: Diagnosis not present

## 2014-08-11 DIAGNOSIS — E119 Type 2 diabetes mellitus without complications: Secondary | ICD-10-CM | POA: Diagnosis not present

## 2014-08-24 DIAGNOSIS — I10 Essential (primary) hypertension: Secondary | ICD-10-CM | POA: Diagnosis not present

## 2014-08-24 DIAGNOSIS — E119 Type 2 diabetes mellitus without complications: Secondary | ICD-10-CM | POA: Diagnosis not present

## 2014-09-01 DIAGNOSIS — J019 Acute sinusitis, unspecified: Secondary | ICD-10-CM | POA: Diagnosis not present

## 2014-11-29 DIAGNOSIS — R109 Unspecified abdominal pain: Secondary | ICD-10-CM | POA: Diagnosis not present

## 2014-11-29 DIAGNOSIS — I10 Essential (primary) hypertension: Secondary | ICD-10-CM | POA: Diagnosis not present

## 2014-11-29 DIAGNOSIS — J309 Allergic rhinitis, unspecified: Secondary | ICD-10-CM | POA: Diagnosis not present

## 2014-11-29 DIAGNOSIS — E119 Type 2 diabetes mellitus without complications: Secondary | ICD-10-CM | POA: Diagnosis not present

## 2015-01-06 DIAGNOSIS — E119 Type 2 diabetes mellitus without complications: Secondary | ICD-10-CM | POA: Diagnosis not present

## 2015-01-06 DIAGNOSIS — H5203 Hypermetropia, bilateral: Secondary | ICD-10-CM | POA: Diagnosis not present

## 2015-01-06 DIAGNOSIS — H25013 Cortical age-related cataract, bilateral: Secondary | ICD-10-CM | POA: Diagnosis not present

## 2015-02-20 DIAGNOSIS — I10 Essential (primary) hypertension: Secondary | ICD-10-CM | POA: Diagnosis not present

## 2015-02-20 DIAGNOSIS — E119 Type 2 diabetes mellitus without complications: Secondary | ICD-10-CM | POA: Diagnosis not present

## 2015-02-20 DIAGNOSIS — Z125 Encounter for screening for malignant neoplasm of prostate: Secondary | ICD-10-CM | POA: Diagnosis not present

## 2015-02-27 DIAGNOSIS — Z1212 Encounter for screening for malignant neoplasm of rectum: Secondary | ICD-10-CM | POA: Diagnosis not present

## 2015-02-27 DIAGNOSIS — E663 Overweight: Secondary | ICD-10-CM | POA: Diagnosis not present

## 2015-02-27 DIAGNOSIS — G250 Essential tremor: Secondary | ICD-10-CM | POA: Diagnosis not present

## 2015-02-27 DIAGNOSIS — Z7982 Long term (current) use of aspirin: Secondary | ICD-10-CM | POA: Diagnosis not present

## 2015-02-27 DIAGNOSIS — Z789 Other specified health status: Secondary | ICD-10-CM | POA: Diagnosis not present

## 2015-06-29 DIAGNOSIS — E118 Type 2 diabetes mellitus with unspecified complications: Secondary | ICD-10-CM | POA: Diagnosis not present

## 2015-07-03 DIAGNOSIS — I781 Nevus, non-neoplastic: Secondary | ICD-10-CM | POA: Diagnosis not present

## 2015-07-03 DIAGNOSIS — L821 Other seborrheic keratosis: Secondary | ICD-10-CM | POA: Diagnosis not present

## 2015-08-23 DIAGNOSIS — J069 Acute upper respiratory infection, unspecified: Secondary | ICD-10-CM | POA: Diagnosis not present

## 2015-08-23 DIAGNOSIS — J309 Allergic rhinitis, unspecified: Secondary | ICD-10-CM | POA: Diagnosis not present

## 2015-08-23 DIAGNOSIS — R05 Cough: Secondary | ICD-10-CM | POA: Diagnosis not present

## 2015-09-04 DIAGNOSIS — J309 Allergic rhinitis, unspecified: Secondary | ICD-10-CM | POA: Diagnosis not present

## 2015-09-04 DIAGNOSIS — J069 Acute upper respiratory infection, unspecified: Secondary | ICD-10-CM | POA: Diagnosis not present

## 2015-10-05 IMAGING — CR DG LUMBAR SPINE 2-3V
2 series · 2 of 2 positions shown · non-contrast
Comparison: CT lumbar spine 01/31/2014

CLINICAL DATA: L3 and L4 laminectomy.  L4-L5 fusion.

EXAM:
LUMBAR SPINE - 2-3 VIEW

[lat (1 of 2)]
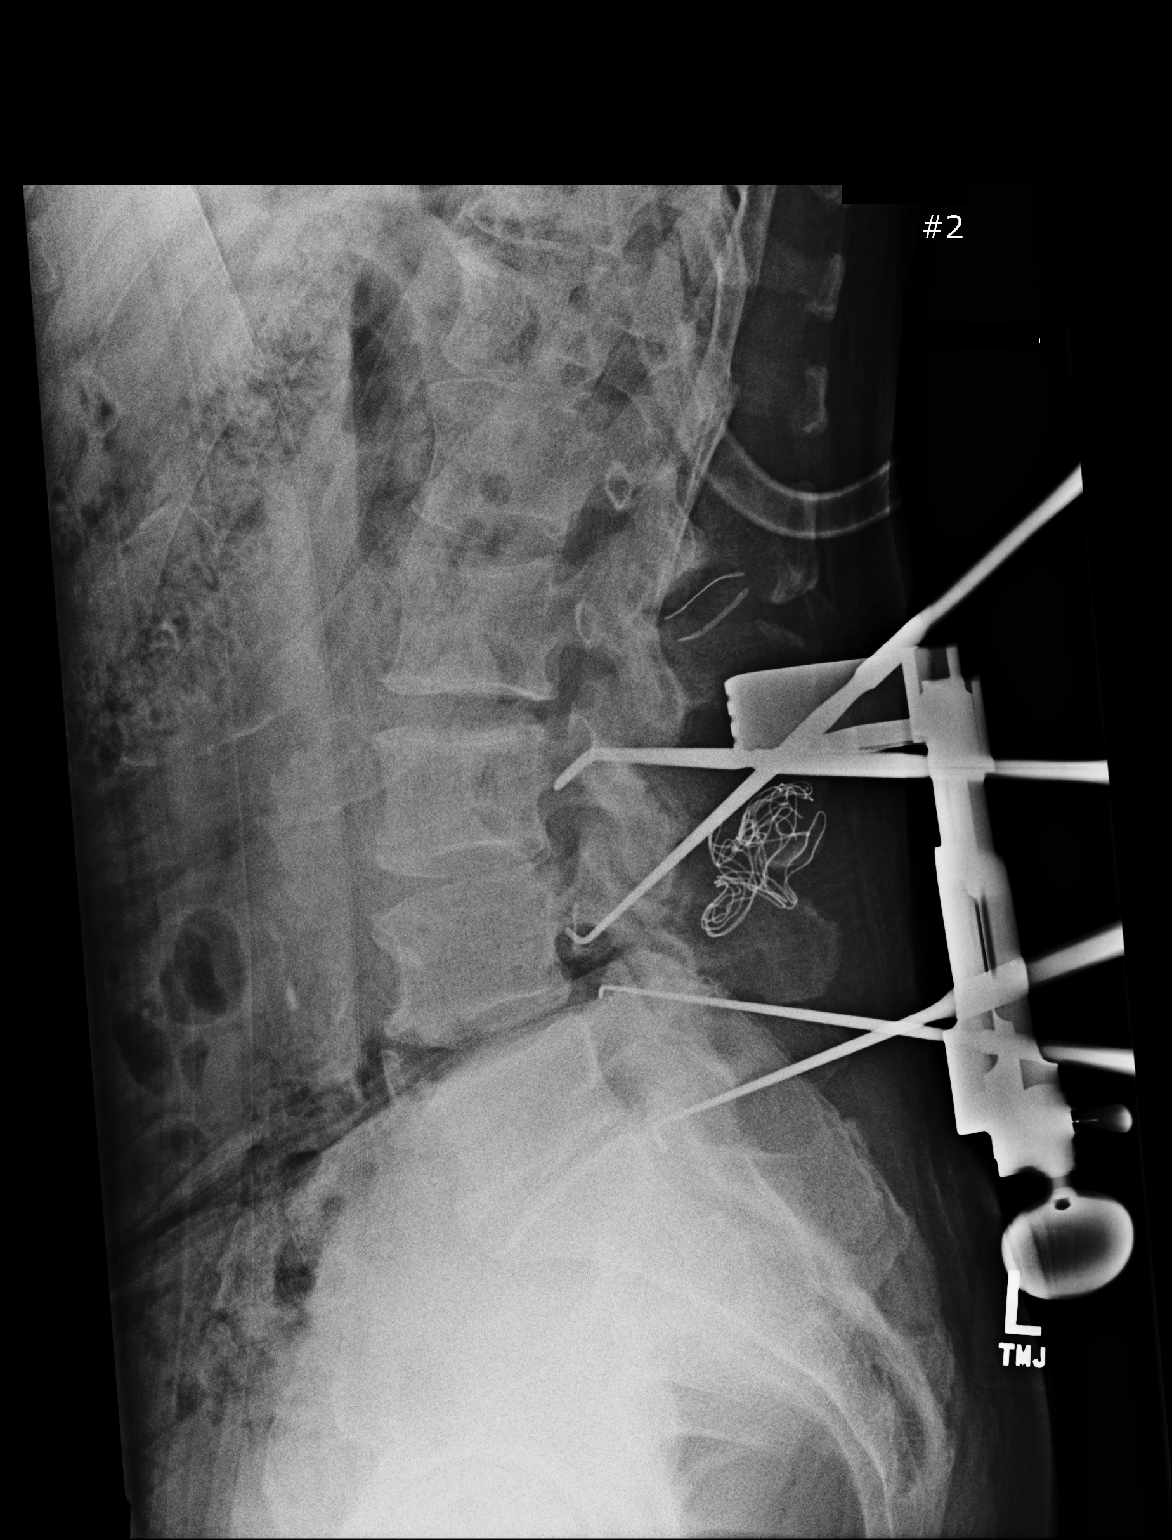

[lat (2 of 2)]
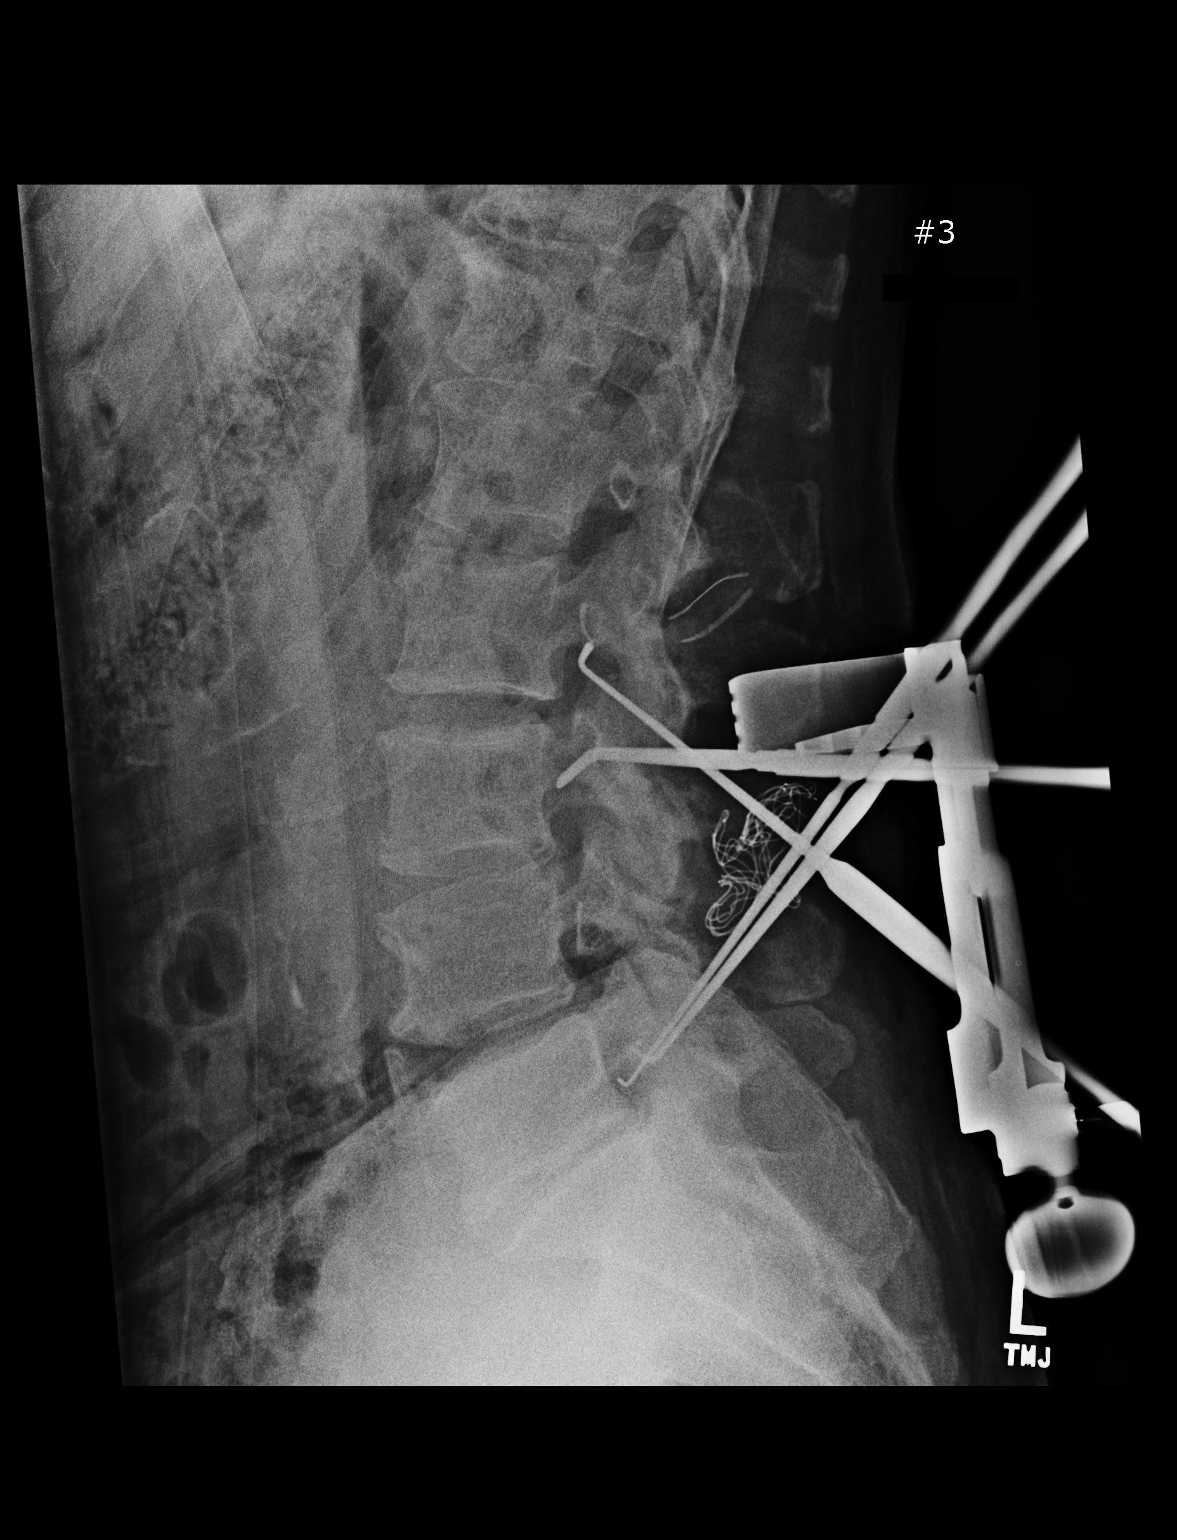

[2 of 2 positions shown; findings below may reference images not displayed]

FINDINGS: Three lateral views of the lumbar spine submitted. These are
intraoperative localization films. Please see labeling for last film
#3.

There is significant disc space flattening with mild anterior
spurring at L5-S1 level. Two metallic instruments just posterior to
L5 vertebral body at the level of neural foramina. A second
posterior metallic instrument noted just posterior to mid aspect of
L3 vertebral body at the level of takeoff of spinous process. A
third metallic instrument is noted just posterior to L2 vertebral
body upper level of neural foramina.
IMPRESSION: Posterior metallic localization instruments as described above.
Please see vertebral bodies labeling and comments for film#3

## 2016-01-16 DIAGNOSIS — H52203 Unspecified astigmatism, bilateral: Secondary | ICD-10-CM | POA: Diagnosis not present

## 2016-01-16 DIAGNOSIS — H2513 Age-related nuclear cataract, bilateral: Secondary | ICD-10-CM | POA: Diagnosis not present

## 2016-01-16 DIAGNOSIS — E119 Type 2 diabetes mellitus without complications: Secondary | ICD-10-CM | POA: Diagnosis not present

## 2016-02-08 DIAGNOSIS — H25811 Combined forms of age-related cataract, right eye: Secondary | ICD-10-CM | POA: Diagnosis not present

## 2016-02-08 DIAGNOSIS — H2511 Age-related nuclear cataract, right eye: Secondary | ICD-10-CM | POA: Diagnosis not present

## 2016-02-28 DIAGNOSIS — Z23 Encounter for immunization: Secondary | ICD-10-CM | POA: Diagnosis not present

## 2016-02-28 DIAGNOSIS — Z125 Encounter for screening for malignant neoplasm of prostate: Secondary | ICD-10-CM | POA: Diagnosis not present

## 2016-02-28 DIAGNOSIS — E119 Type 2 diabetes mellitus without complications: Secondary | ICD-10-CM | POA: Diagnosis not present

## 2016-02-28 DIAGNOSIS — E7800 Pure hypercholesterolemia, unspecified: Secondary | ICD-10-CM | POA: Diagnosis not present

## 2016-02-28 DIAGNOSIS — I10 Essential (primary) hypertension: Secondary | ICD-10-CM | POA: Diagnosis not present

## 2016-02-28 DIAGNOSIS — Z0000 Encounter for general adult medical examination without abnormal findings: Secondary | ICD-10-CM | POA: Diagnosis not present

## 2016-03-05 DIAGNOSIS — K5730 Diverticulosis of large intestine without perforation or abscess without bleeding: Secondary | ICD-10-CM | POA: Diagnosis not present

## 2016-03-05 DIAGNOSIS — M2550 Pain in unspecified joint: Secondary | ICD-10-CM | POA: Diagnosis not present

## 2016-03-05 DIAGNOSIS — Z8546 Personal history of malignant neoplasm of prostate: Secondary | ICD-10-CM | POA: Diagnosis not present

## 2016-03-05 DIAGNOSIS — E7800 Pure hypercholesterolemia, unspecified: Secondary | ICD-10-CM | POA: Diagnosis not present

## 2016-03-05 DIAGNOSIS — G4752 REM sleep behavior disorder: Secondary | ICD-10-CM | POA: Diagnosis not present

## 2016-03-05 DIAGNOSIS — Z23 Encounter for immunization: Secondary | ICD-10-CM | POA: Diagnosis not present

## 2016-03-05 DIAGNOSIS — H6120 Impacted cerumen, unspecified ear: Secondary | ICD-10-CM | POA: Diagnosis not present

## 2016-03-11 ENCOUNTER — Encounter: Payer: Self-pay | Admitting: Neurology

## 2016-03-11 ENCOUNTER — Ambulatory Visit (INDEPENDENT_AMBULATORY_CARE_PROVIDER_SITE_OTHER): Payer: Medicare Other | Admitting: Neurology

## 2016-03-11 VITALS — BP 110/78 | HR 72 | Resp 20 | Ht 70.0 in | Wt 190.0 lb

## 2016-03-11 DIAGNOSIS — F515 Nightmare disorder: Secondary | ICD-10-CM | POA: Diagnosis not present

## 2016-03-11 DIAGNOSIS — G4752 REM sleep behavior disorder: Secondary | ICD-10-CM

## 2016-03-11 NOTE — Addendum Note (Signed)
Addended by: Larey Seat on: 03/11/2016 01:45 PM   Modules accepted: Orders

## 2016-03-11 NOTE — Patient Instructions (Signed)

## 2016-03-11 NOTE — Progress Notes (Signed)
SLEEP MEDICINE CLINIC   Provider:  Larey Seat, M D  Referring Provider: Deland Pretty, MD Primary Care Physician:  Horatio Pel, MD  Chief Complaint  Patient presents with  . New Patient (Initial Visit)    acts out dreams, never had a sleep study    HPI:  Tyler Hayes is a 70 y.o. 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   Tyler Hayes reports that he acted out dreams sometimes with kicking more swaying even in his 36s. He is now in his second marriage but he remembers that his first wife was complaining about his abrupt movements and sometimes kicking her or bruising her. She died of leukemia in 09/07/00, 15 years ago. He also reports that he used to be physically so much more active before he retired that he felt he got deeper and more restorative sleep simply because he was physically in need of sleep. He believes that acting out dreams has increased since his retirement, which began in 08-Sep-2011. He used to be a Holiday representative.  Sleep habits are as follows: He likes to retreat to the bedroom around 10 PM, relaxes there , reads and plays solitaire. Around 11:30 PM is when he usually initiate sleep. He has discovered that he doesn't sleep well if he watches TV close to bedtime and therefore eliminated this. He shares a bedroom with his wife, the bedroom is cool ,quiet and dark, he sleeps on multiple pillows. He also reports that he most nights he sleeps well through the night but sometimes he has very vivid dreams and he tends to sleep on the right side, and his acting out of dreams is also correlated to sleeping on the right side. He avoids the supine sleep position as it causes his choking on sinus drainage. Acting out his dreams seems to start later than an hour into  sleep and may last well into the morning hours usually he has only one episode of dream enactment and not multiple. Usually his dreams include being threatened, followed, protecting himself or his wife. Sometimes he feels that sometimes he tries to bite his legs fand he will flex and inadvertently kicked her. His wife will try to wake him up. He has left the bed . He is trembling and she will call out and wake him. If he goes back to sleep right away sometimes the dream will continue with the same incident. He wakes up always with a dry mouth but doesn't deny any headaches, palpitations or diaphoresis. He has tickling in his throat.    Sleep related medical history and family history:  Father had a tremor, died of a brain tumor while patient was in high school.    Social history:  Retired  Re -married, adult 2 sons and one step son , adult children, he quit smoking in September 08, 1990 , after  7 yeas, and 20 pack years. ETOH;  Seldom,  caffeine :2-3 cups in AM, I one glass of iced tea in PM,and 3 cokes a day.    Review of Systems: Out of a complete 14 system review, the patient complains of only the following symptoms, and all other reviewed systems are negative. No bruxism, intermittent snoring reported, no witnessed apnea. One nocturia if any, dream acting  out.   Epworth score  9 , Fatigue severity score 15  , depression score 3/15    Social History   Social History  . Marital status: Married    Spouse name: N/A  . Number of children: N/A  . Years of education: N/A   Occupational History  . Not on file.   Social History Main Topics  . Smoking status: Former Smoker    Packs/day: 1.50    Years: 20.00    Types: Cigarettes    Quit date: 02/01/1991  . Smokeless tobacco: Never Used  . Alcohol use Yes     Comment: occasionally  . Drug use: No  . Sexual activity: Not on file   Other Topics Concern  . Not on file   Social History Narrative  . No narrative on file    No family history on  file.  Past Medical History:  Diagnosis Date  . Arthritis    back  . Cancer [facility])    prostate  . Diabetes mellitus without complication ([place])    on meds  . Diverticulosis   . Hypercholesteremia   . Hypertension   . Meatal stenosis   . Osteoarthritis   . Sinus drainage   . Tinea pedis     Past Surgical History:  Procedure Laterality Date  . BACK SURGERY  1984, 2013   lumbar  . DG DILATION URETERS  2003  . EYE SURGERY Bilateral 1999   Lasik  . POSTERIOR LUMBAR FUSION 4 LEVEL N/A 06/01/2014   Procedure: LUMBAR THREE TO FOUR, LUMBAR FOUR TO FIVE LAMINECTOMY,  RIGHT LUMBAR FUSION AT LUMBAR FOUR TO FIVE;  Surgeon: Floyce Stakes, MD;  Location: [facility];  Service: Neurosurgery;  Laterality: N/A;  POSSIBLE L2-3 L3-4 L4-5 L5-S1 POSTERIOR LUMBAR INTERBODY FUSION  . PROSTATECTOMY  2002    Current Outpatient Prescriptions  Medication Sig Dispense Refill  . aspirin EC 81 MG tablet Take 81 mg by mouth daily.    Marland Kitchen atorvastatin (LIPITOR) 10 MG tablet Take 10 mg by mouth daily.    . Canagliflozin-Metformin HCl (INVOKAMET) 50-1000 MG TABS Take 1 tablet by mouth 2 (two) times daily.    . Cholecalciferol (VITAMIN D-3) 1000 UNITS CAPS Take 1,000 Units by mouth daily.    . fluticasone (FLONASE) 50 MCG/ACT nasal spray Place 1 spray into both nostrils daily as needed for allergies or rhinitis.    Marland Kitchen losartan-hydrochlorothiazide (HYZAAR) 100-25 MG per tablet Take 1 tablet by mouth daily.    . meloxicam (MOBIC) 15 MG tablet Take 15 mg by mouth daily as needed for pain.    . montelukast (SINGULAIR) 10 MG tablet Take 10 mg by mouth every evening.    . traMADol (ULTRAM) 50 MG tablet Take 50 mg by mouth every 6 (six) hours as needed (for pain).     No current facility-administered medications for this visit.     Allergies as of 03/11/2016 - Review Complete 03/11/2016  Allergen Reaction Noted  . Ace inhibitors  03/07/2016    Vitals: BP 110/78   Pulse 72   Resp 20   Ht 5\' 10"  (1.778 m)    Wt 190 lb (86.2 kg)   BMI 27.26 kg/m  Last Weight:  Wt Readings from Last 1 Encounters:  03/11/16 190 lb (86.2 kg)   [id number] mass index is 27.26 kg/m.     Last Height:   Ht Readings from Last 1 Encounters:  03/11/16 5\' 10"  (1.778 m)    Physical exam:  General: The patient is awake, alert and appears not in acute distress. The patient is well groomed. Head: Normocephalic, atraumatic. Neck is supple. Mallampati 2 neck circumference:16.25, . Nasal airflow patent , TMJ not  evident . Retrognathia is not seen.  Full dentures.  Cardiovascular:  Regular rate and rhythm, without  murmurs or carotid bruit, and without distended neck veins. Respiratory: Lungs are clear to auscultation. Skin:  Without evidence of edema, or rash Trunk: BMI is low . The patient's posture is erect.   Neurologic exam : The patient is awake and alert, oriented to place and time.   Memory subjective described as intact.  Attention span & concentration ability appears normal.  Speech is fluent,  without dysarthria, dysphonia or aphasia.  Mood and affect are appropriate.  Cranial nerves: Pupils are equal and briskly reactive to light. Right eye status post cataract surgery.  Extraocular movements  in vertical and horizontal planes intact and without nystagmus. Visual fields by finger perimetry are intact.Hearing to finger rub intact.  Facial sensation intact to fine touch. Facial motor strength is symmetric and tongue and uvula move midline. Shoulder shrug was symmetrical.   Motor exam:  Tyler Hayes presents with cogwheeling rigidity over the right biceps only and a slight increase in tone at the right wrist. There is a resting tremor and  action tremor noted. None of these findings in the left hand. He noticed a change in his handwriting and that he has to concentrate on bringing food to his mouth.  Sensory:  Fine touch, pinprick and vibration were tested in all extremities. Proprioception tested in the upper  extremities was normal.  Coordination: Rapid alternating movements in the fingers/hands was normal. Finger-to-nose maneuver with right hand tremor.  Gait and station: Patient walks without assistive device and is able unassisted to climb up to the exam table. Strength within normal limits.  Stance is stable and normal.  Deep tendon reflexes: in the  upper and lower extremities are symmetric and intact. Babinski maneuver response is downgoing.  The patient was advised of the nature of the diagnosed sleep disorder , the treatment options and risks for general a health and wellness arising from not treating the condition.  I spent more than 45  minutes of face to face time with the patient. Greater than 50% of time was spent in counseling and coordination of care. We have discussed the diagnosis and differential and I answered the patient's questions.     Assessment:  After physical and neurologic examination, review of laboratory studies,  Personal review of imaging studies, reports of other /same  Imaging studies ,  Results of polysomnography/ neurophysiology testing and pre-existing records as far as provided in visit., my assessment is   1) there is actually very little resting tremor but more of an action tremor noted but it is associated with biceps rigidity. This is a mixed finding but in light of the described parasomnia activity I would keep in mind that he may develop Parkinson later on at this time I would not diagnose him. I like for the patient to undergo MRI brain with brainstem evaluation to look at the substantia nigra. I will order a sleep study, attended for this patient with REM behavior monitor his which means that there will be a video recording and audio recording. It would help me to see if his movements truly last into REM sleep or if there are slow-wave sleep related parasomnias.   Plan:  Treatment plan and additional workup :  I will invite Tyler Hayes for an attended sleep  study, I do not suspect that he has apnea based on what he and his wife have witnessed. We will do an expanded montage with video and audio. Medication to suppress REM sleep behavior are including SSRIs, melatonin and over-the-counter supplement can be used, and Klonopin can be used I would like for the patient to try 3-6 mg of melatonin to be taken half hour before intended sleep time this may help to control the dream activity and the enactment. OTC available.   Rv after sleep study    Larey Seat MD  03/11/2016   CC: Deland Pretty, [address] [place], [place] [address]

## 2016-03-19 DIAGNOSIS — M79641 Pain in right hand: Secondary | ICD-10-CM | POA: Diagnosis not present

## 2016-03-19 DIAGNOSIS — M25569 Pain in unspecified knee: Secondary | ICD-10-CM | POA: Diagnosis not present

## 2016-03-19 DIAGNOSIS — M79642 Pain in left hand: Secondary | ICD-10-CM | POA: Diagnosis not present

## 2016-03-19 DIAGNOSIS — M170 Bilateral primary osteoarthritis of knee: Secondary | ICD-10-CM | POA: Diagnosis not present

## 2016-03-19 DIAGNOSIS — M150 Primary generalized (osteo)arthritis: Secondary | ICD-10-CM | POA: Diagnosis not present

## 2016-03-19 DIAGNOSIS — M79643 Pain in unspecified hand: Secondary | ICD-10-CM | POA: Diagnosis not present

## 2016-04-21 ENCOUNTER — Ambulatory Visit (INDEPENDENT_AMBULATORY_CARE_PROVIDER_SITE_OTHER): Payer: Medicare Other | Admitting: Neurology

## 2016-04-21 DIAGNOSIS — G4752 REM sleep behavior disorder: Secondary | ICD-10-CM | POA: Diagnosis not present

## 2016-04-21 DIAGNOSIS — F515 Nightmare disorder: Secondary | ICD-10-CM

## 2016-04-22 DIAGNOSIS — M25569 Pain in unspecified knee: Secondary | ICD-10-CM | POA: Diagnosis not present

## 2016-04-22 DIAGNOSIS — M79643 Pain in unspecified hand: Secondary | ICD-10-CM | POA: Diagnosis not present

## 2016-04-22 DIAGNOSIS — M150 Primary generalized (osteo)arthritis: Secondary | ICD-10-CM | POA: Diagnosis not present

## 2016-04-22 DIAGNOSIS — M170 Bilateral primary osteoarthritis of knee: Secondary | ICD-10-CM | POA: Diagnosis not present

## 2016-04-22 NOTE — Procedures (Signed)
PATIENT'S NAME:  Tyler Hayes, Tyler Hayes DOB:      [birth year]      MR#:    [id number]     DATE OF RECORDING: 04/21/2016 REFERRING M.D.:  Deland Pretty MD Study Performed:   Baseline Polysomnogram HISTORY:  Tyler Hayes reports that Tyler Hayes acts out dreams, sometimes with kicking, present for all his life- even in his 61s. Tyler Hayes is now in his second marriage but Tyler Hayes remembers that his first wife was complaining about his abrupt movements and sometimes kicking her or bruising her. Since Tyler Hayes retired Tyler Hayes is physically less active. Tyler Hayes believes that acting out dreams has increased since his retirement, which began in 2013.   Arthritis, prostate cancer, diabetes, diverticulosis, hypocholesteremia, hypertension, meatal stenosis, osteoarthritis, sinus drainage and tinea pedis The patient endorsed the Epworth Sleepiness Scale at 9/24 points.  The patient's weight 190 pounds with a height of 70 (inches), resulting in a BMI of 27.1 kg/m2.The patient's neck circumference measured 16 inches.  CURRENT MEDICATIONS: Aspirin, Atorvastatin, Canagliflozin, Cholecalciferol, Fluticasone, Losartan-hydrochlorothiazide, Meloxicam, Montelukast and Tramadol   PROCEDURE:  This is a multichannel digital polysomnogram utilizing the Somnostar 11.2 system.  Electrodes and sensors were applied and monitored per AASM Specifications.   EEG, EOG, Chin and Limb EMG, were sampled at 200 Hz.  ECG, Snore and Nasal Pressure, Thermal Airflow, Respiratory Effort, CPAP Flow and Pressure, Oximetry was sampled at 50 Hz. Digital video and audio were recorded.      BASELINE STUDY  Lights Out was at 22:37 and Lights On at 05:11.  Total recording time (TRT) was 394.5 minutes, with a total sleep time (TST) of  315.5 minutes.   The patient's sleep latency was 52 minutes.  REM latency was 110 minutes.  The sleep efficiency was 80.0 %.     SLEEP ARCHITECTURE: WASO (Wake after sleep onset) was 43 minutes.  There were 17 minutes in Stage N1, 256.5 minutes Stage N2, 0 minutes  Stage N3 and 42 minutes in Stage REM.  The percentage of Stage N1 was 5.4%, Stage N2 was 81.3%, Stage N3 was 0% and Stage R (REM sleep) was 13.3%.   RESPIRATORY ANALYSIS:  There were a total of 12 respiratory events:  0 obstructive apneas, 0 central apneas and 0 mixed apneas with a total of 0 apneas and an apnea index (AI) of 0 /hour. There were 12 hypopneas with a hypopnea index of 2.3 /hour. The patient also had 0 respiratory event related arousals (RERAs).     The total APNEA/HYPOPNEA INDEX (AHI) was 2.3/hour and the total RESPIRATORY DISTURBANCE INDEX was 2.3 /hour.  4 events occurred in REM sleep and 16 events in NREM. The REM AHI was 5.7 /hour, versus a non-REM AHI of 1.8. The patient spent 192 minutes of total sleep time in the supine position and 124 minutes in non-supine.. The supine AHI was 3.4 versus a non-supine AHI of 0.5.  OXYGEN SATURATION & C02:  The Wake baseline 02 saturation was 94%, with the lowest being 87%. Time spent below 89% saturation equaled 0.7 minutes.   PERIODIC LIMB MOVEMENTS:   The patient had a total of 16 Periodic Limb Movements.  The Periodic Limb Movement (PLM) index was 3.0 and the PLM Arousal index was 0.2/hour.  Audio and video analysis did show movements and vocalizations in REM sleep.   Loud Snoring was noted in supine and in lateral sleep position.  EKG was in keeping with normal sinus rhythm (NSR).  IMPRESSION: REM sleep behavior confirmed. Vocalization ( yelling ) during  REM sleep recorded.  No significant apnea, PLM arousals or cardiac abnormalities, no hypoxemia.  The patient was recorded snoring loudly, without having sleep apnea.   RECOMMENDATIONS:  1. Loud snoring in all sleep positions can be treated with a dental device. CPAP would not be indicated. 2. REM BD is treated with melatonin and /or Klonopin. These medication suppress REM sleep and reduce the associated dream enactment actions.  3. Reduce any narcotic pain medication, avoid using any  opiates. 4. Avoid caffeine-containing beverages and chocolate. 5. A follow up appointment will be scheduled in the Sleep Clinic at [facility]. The referring provider will be notified of the results.      I certify that I have reviewed the entire raw data recording prior to the issuance of this report in accordance with the Standards of Accreditation of the American Academy of Sleep Medicine (AASM)      Larey Seat, MD   04-22-2016  Diplomat, American Board of Psychiatry and Neurology  Diplomat, American Board of [place] Director, [facility] [employer]

## 2016-04-24 ENCOUNTER — Telehealth: Payer: Self-pay

## 2016-04-24 NOTE — Telephone Encounter (Deleted)
-----   Message from Larey Seat, MD sent at 04/22/2016  4:59 PM EST ----- IMPRESSION:  HST revealed mild Obstructive Sleep Apnea , AHI was 8.5 /hr. and mild respiratory disturbance index at 11.8/hr. RDI indicates loud snoring. No central apneas.  Only 7 minutes of clinically significant oxygen desaturation at or below 88%.  Tachy-Brady heart rate variability between 41 and 112 BPM.   RECOMMENDATION: This patient could consider CPAP treatment , but may do equally well or better with a dental device.  The mild apnea form has many treatment options, and snoring treatment will be a side effect.  I will be making a referral to dentistry or a CPAP referral, depending on this patients preference. Please call and inquire, CD

## 2016-04-24 NOTE — Telephone Encounter (Signed)
-----   Message from Larey Seat, MD sent at 04/22/2016  4:47 PM EST ----- IMPRESSION: REM sleep behavior confirmed. Vocalization ( yelling ) during REM sleep recorded.  No significant apnea, PLM arousals or cardiac abnormalities, no hypoxemia.  The patient was recorded snoring loudly, without having sleep apnea.   RECOMMENDATIONS:  1. Loud snoring in all sleep positions can be treated with a dental device. CPAP would not be indicated. 2. REM BD is treated with melatonin and /or Klonopin. These medication suppress REM sleep and reduce the associated dream enactment actions.  3. Reduce any narcotic pain medication, avoid using any opiates. 4. Avoid caffeine-containing beverages and chocolate. 5. A follow up appointment will be scheduled in the Sleep Clinic at [facility]. The referring provider will be notified of the results.

## 2016-04-24 NOTE — Telephone Encounter (Signed)
I spoke to patient and he is aware of results and recommendations. He is willing to start treatment. I will send orders to [facility]. I have sent report to PCP. Patient will get a letter reminding him to make f/u appt and stress the importance of compliance.

## 2016-04-29 DIAGNOSIS — M150 Primary generalized (osteo)arthritis: Secondary | ICD-10-CM | POA: Diagnosis not present

## 2016-04-29 DIAGNOSIS — M170 Bilateral primary osteoarthritis of knee: Secondary | ICD-10-CM | POA: Diagnosis not present

## 2016-04-29 DIAGNOSIS — M25569 Pain in unspecified knee: Secondary | ICD-10-CM | POA: Diagnosis not present

## 2016-04-29 DIAGNOSIS — M79643 Pain in unspecified hand: Secondary | ICD-10-CM | POA: Diagnosis not present

## 2016-05-01 ENCOUNTER — Telehealth: Payer: Self-pay

## 2016-05-01 NOTE — Telephone Encounter (Signed)
I spoke to patient and he is aware of results and recommendations. We were able to make f/u appt to discuss results and treatment options further. I sent copy of report to PCP.

## 2016-05-01 NOTE — Telephone Encounter (Signed)
-----   Message from Larey Seat, MD sent at 04/22/2016  4:47 PM EST ----- IMPRESSION: REM sleep behavior confirmed. Vocalization ( yelling ) during REM sleep recorded.  No significant apnea, PLM arousals or cardiac abnormalities, no hypoxemia.  The patient was recorded snoring loudly, without having sleep apnea.   RECOMMENDATIONS:  1. Loud snoring in all sleep positions can be treated with a dental device. CPAP would not be indicated. 2. REM BD is treated with melatonin and /or Klonopin. These medication suppress REM sleep and reduce the associated dream enactment actions.  3. Reduce any narcotic pain medication, avoid using any opiates. 4. Avoid caffeine-containing beverages and chocolate. 5. A follow up appointment will be scheduled in the Sleep Clinic at [facility]. The referring provider will be notified of the results.

## 2016-05-29 DIAGNOSIS — M170 Bilateral primary osteoarthritis of knee: Secondary | ICD-10-CM | POA: Diagnosis not present

## 2016-05-29 DIAGNOSIS — M25569 Pain in unspecified knee: Secondary | ICD-10-CM | POA: Diagnosis not present

## 2016-05-29 DIAGNOSIS — M150 Primary generalized (osteo)arthritis: Secondary | ICD-10-CM | POA: Diagnosis not present

## 2016-05-29 DIAGNOSIS — M79642 Pain in left hand: Secondary | ICD-10-CM | POA: Diagnosis not present

## 2016-05-30 DIAGNOSIS — H25812 Combined forms of age-related cataract, left eye: Secondary | ICD-10-CM | POA: Diagnosis not present

## 2016-05-30 DIAGNOSIS — H2512 Age-related nuclear cataract, left eye: Secondary | ICD-10-CM | POA: Diagnosis not present

## 2016-06-04 ENCOUNTER — Telehealth: Payer: Self-pay

## 2016-06-04 NOTE — Telephone Encounter (Signed)
I called pt. Our office is closed until noon tomorrow due to inclement weather, and therefore, pt's appt will need to be rescheduled. Pt is agreeable to rescheduling until 06/26/16 at 11:30am. Pt verbalized understanding of new appt date and time.

## 2016-06-05 ENCOUNTER — Ambulatory Visit: Payer: Self-pay | Admitting: Neurology

## 2016-06-26 ENCOUNTER — Ambulatory Visit (INDEPENDENT_AMBULATORY_CARE_PROVIDER_SITE_OTHER): Payer: Medicare Other | Admitting: Neurology

## 2016-06-26 ENCOUNTER — Encounter: Payer: Self-pay | Admitting: Neurology

## 2016-06-26 VITALS — BP 124/56 | HR 102 | Resp 16 | Ht 70.0 in | Wt 194.0 lb

## 2016-06-26 DIAGNOSIS — G4752 REM sleep behavior disorder: Secondary | ICD-10-CM

## 2016-06-26 MED ORDER — MELATONIN 3 MG PO CAPS: 3.0000 mg | ORAL_CAPSULE | Freq: Every day | ORAL | 0 refills | Status: DC

## 2016-06-26 NOTE — Patient Instructions (Signed)
Melatonin oral solid dosage forms What is this medicine? MELATONIN (mel uh TOH nin) is a dietary supplement. It is mostly promoted to help maintain normal sleep patterns. The FDA has not approved this supplement for any medical use. This supplement may be used for other purposes; ask your health care provider or pharmacist if you have questions. This medicine may be used for other purposes; ask your health care provider or pharmacist if you have questions. COMMON BRAND NAME(S): Melatonex What should I tell my health care provider before I take this medicine? They need to know if you have any of these conditions: -cancer -depression or mental illness -diabetes -hormone problems -if you often drink alcohol -immune system problems -liver disease -lung or breathing disease, like asthma -organ transplant -seizure disorder -an unusual or allergic reaction to melatonin, other medicines, foods, dyes, or preservatives -pregnant or trying to get pregnant -breast-feeding How should I use this medicine? Take this supplement by mouth with a glass of water. Do not take with food. This supplement is usually taken 1 or 2 hours before bedtime. After taking this supplement, limit your activities to those needed to prepare for bed. Some products may be chewed or dissolved in the mouth before swallowing. Some tablets or capsules must be swallowed whole; do not cut, crush or chew. Follow the directions on the package labeling, or take as directed by your health care professional. Do not take this supplement more often than directed. Talk to your pediatrician regarding the use of this supplement in children. Special care may be needed. This supplement is not recommended for use in children without a prescription. Overdosage: If you think you have taken too much of this medicine contact a poison control center or emergency room at once. NOTE: This medicine is only for you. Do not share this medicine with  others. What if I miss a dose? If you miss taking your dose at the usual time, skip that dose. If it is almost time for your next dose, take only that dose. Do not take double or extra doses. What may interact with this medicine? Do not take this medicine with any of the following medications: -fluvoxamine -ramelteon -tasimelteon This medicine may also interact with the following medications: -alcohol -caffeine -carbamazepine -certain antibiotics like ciprofloxacin -certain medicines for depression, anxiety, or psychotic disturbances -cimetidine -male hormones, like estrogens and birth control pills, patches, rings, or injections -methoxsalen -nifedipine -other medications for sleep -other herbal or dietary supplements -phenobarbital -rifampin -smoking tobacco -tamoxifen -warfarin This list may not describe all possible interactions. Give your health care provider a list of all the medicines, herbs, non-prescription drugs, or dietary supplements you use. Also tell them if you smoke, drink alcohol, or use illegal drugs. Some items may interact with your medicine. What should I watch for while using this medicine? See your doctor if your symptoms do not get better or if they get worse. Do not take this supplement for more than 2 weeks unless your doctor tells you to. You may get drowsy or dizzy. Do not drive, use machinery, or do anything that needs mental alertness until you know how this medicine affects you. Do not stand or sit up quickly, especially if you are an older patient. This reduces the risk of dizzy or fainting spells. Alcohol may interfere with the effect of this medicine. Avoid alcoholic drinks. Talk to your doctor before you use this supplement if you are currently being treated for an emotional, mental, or sleep problem. This medicine  may interfere with your treatment. Herbal or dietary supplements are not regulated like medicines. Rigid quality control standards are  not required for dietary supplements. The purity and strength of these products can vary. The safety and effect of this dietary supplement for a certain disease or illness is not well known. This product is not intended to diagnose, treat, cure or prevent any disease. The Food and Drug Administration suggests the following to help consumers protect themselves: -Always read product labels and follow directions. -Natural does not mean a product is safe for humans to take. -Look for products that include USP after the ingredient name. This means that the manufacturer followed the standards of the [place] Pharmacopoeia. -Supplements made or sold by a nationally known food or drug company are more likely to be made under tight controls. You can write to the company for more information about how the product was made. What side effects may I notice from receiving this medicine? Side effects that you should report to your doctor or health care professional as soon as possible: -allergic reactions like skin rash, itching or hives, swelling of the face, lips, or tongue -breathing problems -confusion -depressed mood, irritable, or other changes in moods or behaviors -feeling faint or lightheaded, falls -increased blood pressure -irregular or missed menstrual periods -signs and symptoms of liver injury like dark yellow or brown urine; general ill feeling or flu-like symptoms; light-colored stools; loss of appetite; nausea; right upper belly pain; unusually weak or tired; yellowing of the eyes or skin -trouble staying awake or alert during the day -unusual activities while you are still asleep like driving, eating, making phone calls -unusual bleeding or bruising Side effects that usually do not require medical attention (report to your doctor or health care professional if they continue or are bothersome): -dizziness -drowsiness -headache -hot flashes -nausea -tiredness -unusual dreams or  nightmares -upset stomach This list may not describe all possible side effects. Call your doctor for medical advice about side effects. You may report side effects to FDA at 1-800-FDA-1088. Where should I keep my medicine? Keep out of the reach of children. Store at room temperature or as directed on the package label. Protect from moisture. Throw away any unused supplement after the expiration date. NOTE: This sheet is a summary. It may not cover all possible information. If you have questions about this medicine, talk to your doctor, pharmacist, or health care provider.  2017 Elsevier/Gold Standard (2016-01-29 14:38:22)

## 2016-06-26 NOTE — Progress Notes (Signed)
[facility]   Provider:  Larey Seat, M D  Referring Provider: Deland Pretty, MD Primary Care Physician:  Horatio Pel, MD  Chief Complaint  Patient presents with  . Follow-up    Rm 11. Patient is here with his wife. He is here to discuss results and recommendations from sleep study. No new concerns.     HPI:  Tyler Hayes is a 71 y.o. 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 stated " swinging, kicking and jumping in his sleep", also he mentioned that he developed a " right hand tremor".   [name] reports that he acted out dreams sometimes with kicking more swaying even in his 90s. He is now in his second marriage but he remembers that his first wife was complaining about his abrupt movements and sometimes kicking her or bruising her. She died of leukemia in 2000/08/18, 15 years ago. He also reports that he used to be physically so much more active before he retired that he felt he got deeper and more restorative sleep simply because he was physically in need of sleep. He believes that acting out dreams has increased since his retirement, which began in 08/19/11. He used to be a Holiday representative.  Sleep habits are as follows: He likes to retreat to the bedroom around 10 PM, relaxes there , reads and plays solitaire. Around 11:30 PM is when he usually initiate sleep. He has discovered that he doesn't sleep well if he watches TV close to bedtime and therefore eliminated this. He shares a bedroom with his wife, the bedroom is cool ,quiet and dark, he sleeps on multiple pillows. He also reports that he most nights he sleeps well through the night but sometimes he has very vivid dreams and he tends to sleep on the right side, and his acting out of dreams is also correlated to sleeping on the right side. He avoids the supine sleep position as it causes his choking on sinus  drainage. Acting out his dreams seems to start later than an hour into sleep and may last well into the morning hours usually he has only one episode of dream enactment and not multiple. Usually his dreams include being threatened, followed, protecting himself or his wife. Sometimes he feels that sometimes he tries to bite his legs fand he will flex and inadvertently kicked her. His wife will try to wake him up. He has left the bed . He is trembling and she will call out and wake him. If he goes back to sleep right away sometimes the dream will continue with the same incident. He wakes up always with a dry mouth but doesn't deny any headaches, palpitations or diaphoresis. He has tickling in his throat.  Sleep related medical history and family history:  Father had a tremor, died of a brain tumor while patient was in high school.  Social history:  Retired  Re -married, adult 2 sons and one step son , adult children, he quit smoking in August 19, 1990 , after  67 yeas, and 20 pack years. ETOH;  Seldom,  caffeine :2-3 cups in AM, I one glass of iced tea in PM,and 3 cokes a day.    Interval history from 06/26/2016, I have pleasure of seeing [name] today following a sleep study from 04/21/2016 the patient had no significant apnea, his AHI was 2.3 his  REM AHI was 5.7 which is still considered low. He did not have any measurable apnea if he didn't sleep on his back. 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    Review of Systems: Out of a complete 14 system review, the patient complains of only the following symptoms, and all other reviewed systems are negative.   Social History   Social History  . Marital status: Married    Spouse name: N/A  . Number of children: N/A  . Years of education: N/A   Occupational History  .  Not on file.   Social History Main Topics  . Smoking status: Former Smoker    Packs/day: 1.50    Years: 20.00    Types: Cigarettes    Quit date: 02/01/1991  . Smokeless tobacco: Never Used  . Alcohol use Yes     Comment: occasionally  . Drug use: No  . Sexual activity: Not on file   Other Topics Concern  . Not on file   Social History Narrative  . No narrative on file    No family history on file.  Past Medical History:  Diagnosis Date  . Arthritis    back  . Cancer [facility])    prostate  . Diabetes mellitus without complication ([place])    on meds  . Diverticulosis   . Hypercholesteremia   . Hypertension   . Meatal stenosis   . Osteoarthritis   . Sinus drainage   . Tinea pedis     Past Surgical History:  Procedure Laterality Date  . BACK SURGERY  1984, 2013   lumbar  . DG DILATION URETERS  2003  . EYE SURGERY Bilateral 1999   Lasik  . POSTERIOR LUMBAR FUSION 4 LEVEL N/A 06/01/2014   Procedure: LUMBAR THREE TO FOUR, LUMBAR FOUR TO FIVE LAMINECTOMY,  RIGHT LUMBAR FUSION AT LUMBAR FOUR TO FIVE;  Surgeon: Floyce Stakes, MD;  Location: [facility];  Service: Neurosurgery;  Laterality: N/A;  POSSIBLE L2-3 L3-4 L4-5 L5-S1 POSTERIOR LUMBAR INTERBODY FUSION  . PROSTATECTOMY  2002    Current Outpatient Prescriptions  Medication Sig Dispense Refill  . aspirin EC 81 MG tablet Take 81 mg by mouth daily.    Marland Kitchen atorvastatin (LIPITOR) 10 MG tablet Take 10 mg by mouth daily.    . Canagliflozin-Metformin HCl (INVOKAMET) 50-1000 MG TABS Take 1 tablet by mouth 2 (two) times daily.    . Cholecalciferol (VITAMIN D-3) 1000 UNITS CAPS Take 1,000 Units by mouth daily.    . fluticasone (FLONASE) 50 MCG/ACT nasal spray Place 1 spray into both nostrils daily as needed for allergies or rhinitis.    Marland Kitchen losartan-hydrochlorothiazide (HYZAAR) 100-25 MG per tablet Take 1 tablet by mouth daily.    . meloxicam (MOBIC) 15 MG tablet Take 15 mg by mouth daily as needed for pain.    . montelukast  (SINGULAIR) 10 MG tablet Take 10 mg by mouth every evening.    . traMADol (ULTRAM) 50 MG tablet Take 50 mg by mouth every 6 (six) hours as needed (for pain).     No current facility-administered medications for this visit.     Allergies as of 06/26/2016 - Review Complete 06/26/2016  Allergen Reaction Noted  . Ace inhibitors  03/07/2016    Vitals: BP (!) 124/56   Pulse (!) 102  Resp 16   Ht 5\' 10"  (1.778 m)   Wt 194 lb (88 kg)   BMI 27.84 kg/m  Last Weight:  Wt Readings from Last 1 Encounters:  06/26/16 194 lb (88 kg)   [id number] mass index is 27.84 kg/m.     Last Height:   Ht Readings from Last 1 Encounters:  06/26/16 5\' 10"  (1.778 m)    Physical exam:  General: The patient is awake, alert and  well groomed. Head: Normocephalic, atraumatic. Neck is supple. Mallampati 2 neck circumference:16.25, . Nasal airflow patent , TMJ not  evident . Retrognathia is not seen.  Full dentures.  Cardiovascular:  Regular rate and rhythm, without  murmurs or carotid bruit, and without distended neck veins. Respiratory: Lungs are clear to auscultation. Skin:  Without evidence of edema, or rash Trunk: BMI is low. The patient's posture is erect.   Neurologic exam : The patient is awake and alert, oriented to place and time.   Memory subjective described as intact.  Attention span & concentration ability appears normal.  His wife also does not feel that he has memory loss. Speech is fluent,  without dysarthria, dysphonia or aphasia.  Mood and affect are appropriate.  Cranial nerves: Preserved taste and smell. Pupils are equal and briskly reactive to light. Right eye status post cataract surgery.  Extraocular movements in vertical and horizontal planes intact and without nystagmus. Visual fields by finger perimetry are intact. Hearing to finger rub intact. Facial sensation intact to fine touch. Facial motor strength is symmetric and tongue and uvula move midline. Shoulder shrug was  symmetrical.   Motor exam:  [name] presents with cogwheeling rigidity over the right biceps only and a slight increase in tone at the right wrist.  There is a resting tremor and  action tremor noted. None of these findings in the left hand. He noticed a change in his handwriting and that he has to concentrate on bringing food to his mouth.  Sensory:  Fine touch, pinprick and vibration were normal. Coordination: Rapid alternating movements in the fingers/hands was normal. Finger-to-nose maneuver with right hand tremor. He feels clumsy- with handwriting changes.   Gait and station: Patient walks without assistive device and is able unassisted to climb up to the exam table. Strength within normal limits. Stance is stable and normal.  Deep tendon reflexes: in the  upper and lower extremities are symmetric and intact. Babinski maneuver response is downgoing. The patient was advised of the nature of the diagnosed sleep disorder , the treatment options and risks for general a health and wellness arising from not treating the condition.  I spent more than 25  minutes of face to face time with the patient.  I truly appreciated that Mrs. Purdue came to this visit with her husband and her input has helped greatly. 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 I would like for [name] to follow [place] every 6 months to see if there is a development in terms of a less essential and more resting tremor, rigidity or memory loss.  Greater than 50% of time was spent in counseling and coordination of care. We have discussed the diagnosis and differential and I answered the patient's questions.     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 there is actually very little resting tremor but more of an action tremor noted but it is associated with biceps rigidity. This is a mixed finding but in light of the described parasomnia activity I would keep in mind that he may develop Parkinson later on  - at this time I would not diagnose him.  His sleep study has confirmed that he has REM behavior related enactment of visit dreams.  He cannot recall these spells, but his wife has grown more concerned about him suffering injuries and her being hit. Sometimes this can happen more than once at night as he saw during the sleep study that may be nights when he has none. REM behavior disorder can be an early sign of loose body disease or of Parkinson's disease His wife has reported that he falls out of bed during  vivid dream enactment. He is also especially concerned because he underwent back surgery 20 months ago, he underwent lumbar fusion surgery.  PLAN:    Snoring , not apnea, sinusitis. Mouth breathing. Avoid sleeping on the back, Tennisball.    REM BD -I have suggested that [name] starts melatonin supplements the should be taken 30 minutes prior to intended bedtime. The bedroom should be quiet cool and dark. It is especially unhelpful to have a TV running in the background been suffering from vivid dream enactment, these noises can be incorporated in dreams. I do not have a TV in the bedroom, it should be switched off 40-60 minutes before going to bed. There should be no other screen light  (no blue light,  no cell phone, nor laptop).  Eliminate music from the bedroom. Melatonin at 6 mg or less, taken 30 minutes before intended bedtime.  If melatonin doesn't work , will use Klonopin. Will follow up with NP or me in 4-6 month;     Larey Seat MD  06/26/2016   CC: Deland Pretty, [address], [place]  [id number]

## 2016-08-06 DIAGNOSIS — R05 Cough: Secondary | ICD-10-CM | POA: Diagnosis not present

## 2016-08-06 DIAGNOSIS — I10 Essential (primary) hypertension: Secondary | ICD-10-CM | POA: Diagnosis not present

## 2016-08-06 DIAGNOSIS — Z789 Other specified health status: Secondary | ICD-10-CM | POA: Diagnosis not present

## 2016-08-30 DIAGNOSIS — E118 Type 2 diabetes mellitus with unspecified complications: Secondary | ICD-10-CM | POA: Diagnosis not present

## 2016-08-30 DIAGNOSIS — E7800 Pure hypercholesterolemia, unspecified: Secondary | ICD-10-CM | POA: Diagnosis not present

## 2016-09-03 DIAGNOSIS — E7800 Pure hypercholesterolemia, unspecified: Secondary | ICD-10-CM | POA: Diagnosis not present

## 2016-09-03 DIAGNOSIS — G4752 REM sleep behavior disorder: Secondary | ICD-10-CM | POA: Diagnosis not present

## 2016-09-03 DIAGNOSIS — E118 Type 2 diabetes mellitus with unspecified complications: Secondary | ICD-10-CM | POA: Diagnosis not present

## 2016-09-26 DIAGNOSIS — M79645 Pain in left finger(s): Secondary | ICD-10-CM | POA: Diagnosis not present

## 2016-09-26 DIAGNOSIS — M79642 Pain in left hand: Secondary | ICD-10-CM | POA: Diagnosis not present

## 2016-09-26 DIAGNOSIS — M150 Primary generalized (osteo)arthritis: Secondary | ICD-10-CM | POA: Diagnosis not present

## 2016-09-26 DIAGNOSIS — M170 Bilateral primary osteoarthritis of knee: Secondary | ICD-10-CM | POA: Diagnosis not present

## 2016-09-26 DIAGNOSIS — M25569 Pain in unspecified knee: Secondary | ICD-10-CM | POA: Diagnosis not present

## 2016-11-05 ENCOUNTER — Ambulatory Visit: Payer: Medicare Other | Admitting: Adult Health

## 2016-12-23 ENCOUNTER — Ambulatory Visit (INDEPENDENT_AMBULATORY_CARE_PROVIDER_SITE_OTHER): Payer: Medicare Other | Admitting: Adult Health

## 2016-12-23 ENCOUNTER — Encounter: Payer: Self-pay | Admitting: Adult Health

## 2016-12-23 VITALS — BP 119/75 | HR 81 | Wt 191.6 lb

## 2016-12-23 DIAGNOSIS — R251 Tremor, unspecified: Secondary | ICD-10-CM | POA: Diagnosis not present

## 2016-12-23 DIAGNOSIS — G4752 REM sleep behavior disorder: Secondary | ICD-10-CM

## 2016-12-23 NOTE — Patient Instructions (Signed)
Try taking melatonin 3 mg 30 minutes- 1 hour before bedtime If your symptoms worsen or you develop new symptoms please let us know.

## 2016-12-23 NOTE — Progress Notes (Signed)
PATIENT: Tyler Hayes DOB: 23-Aug-1945  REASON FOR VISIT: follow up HISTORY FROM: patient  HISTORY OF PRESENT ILLNESS: Today 12/23/16 Tyler Hayes is a 71 year old male with a history of REM sleep behavior disorder. He returns today for follow-up. At the last visit he was instructed to try melatonin 30 minutes before bedtime. He was also given tips for improving his sleep hygiene. He states he has not been consistent with taking melatonin. His wife states that he continues to kick and thrash about in bed. The patient states that he was advised that he should not uses tablet when trying to initiate sleep. He reports that he is not stopped doing this. He reports that he continues to have tremor in both hands. He states it typically occurs when he is trying to eat or drink. He states his handwriting is terrible. He denies any significant changes with gait or balance. He returns today for an evaluation.  Interval history from 06/26/2016: Copied from Tyler Hayes notes: I have pleasure of seeing Mr. and Tyler Hayes today following a sleep study from 04/21/2016 the patient had no significant apnea, his AHI was 2.3 his REM AHI was 5.7 which is still considered low. He did not have any measurable apnea if he didn't sleep on his back. 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  HISTORY Initial visit: Copied from Tyler Hayes notes: Tyler Hayes is a 71 y.o. male , seen here as a referral/ revisit  from Tyler Hayes for  REM Behavior disorder,   Chief complaint according to patient : Tyler Hayes stated " swinging, kicking and jumping in his sleep", also he mentioned that he developed a " right hand tremor".   Tyler Hayes reports that he acted out dreams sometimes with kicking more swaying even in his 17s. He is now in his second  marriage but he remembers that his first wife was complaining about his abrupt movements and sometimes kicking her or bruising her. She died of leukemia in Sep 12, 2000, 15 years ago. He also reports that he used to be physically so much more active before he retired that he felt he got deeper and more restorative sleep simply because he was physically in need of sleep. He believes that acting out dreams has increased since his retirement, which began in September 13, 2011. He used to be a Holiday representative.  Sleep habits are as follows: He likes to retreat to the bedroom around 10 PM, relaxes there , reads and plays solitaire. Around 11:30 PM is when he usually initiate sleep. He has discovered that he doesn't sleep well if he watches TV close to bedtime and therefore eliminated this. He shares a bedroom with his wife, the bedroom is cool ,quiet and dark, he sleeps on multiple pillows. He also reports that he most nights he sleeps well through the night but sometimes he has very vivid dreams and he tends to sleep on the right side, and his acting out of dreams is also correlated to sleeping on the right side. He avoids the supine sleep position as it causes his choking on sinus drainage. Acting out his dreams seems to start later than an hour into sleep and may last well into the morning hours usually he has only one episode of dream enactment and not multiple. Usually his dreams include  being threatened, followed, protecting himself or his wife. Sometimes he feels that sometimes he tries to bite his legs fand he will flex and inadvertently kicked her. His wife will try to wake him up. He has left the bed . He is trembling and she will call out and wake him. If he goes back to sleep right away sometimes the dream will continue with the same incident. He wakes up always with a dry mouth but doesn't deny any headaches, palpitations or diaphoresis. He has tickling in his throat.  Sleep related medical history and family  history:  Father had a tremor, died of a brain tumor while patient was in high school.  Social history:  Retired  Re -married, adult 2 sons and one step son , adult children, he quit smoking in 1992 , after  26 yeas, and 20 pack years. ETOH;  Seldom,  caffeine :2-3 cups in AM, I one glass of iced tea in PM,and 3 cokes a day.        REVIEW OF SYSTEMS: Out of a complete 14 system review of symptoms, the patient complains only of the following symptoms, and all other reviewed systems are negative.  See history of present illness  ALLERGIES: Allergies  Allergen Reactions  . Ace Inhibitors     HOME MEDICATIONS: Outpatient Medications Prior to Visit  Medication Sig Dispense Refill  . aspirin EC 81 MG tablet Take 81 mg by mouth daily.    Marland Kitchen atorvastatin (LIPITOR) 10 MG tablet Take 10 mg by mouth daily.    . Canagliflozin-Metformin HCl (INVOKAMET) 50-1000 MG TABS Take 1 tablet by mouth 2 (two) times daily.    . Cholecalciferol (VITAMIN D-3) 1000 UNITS CAPS Take 1,000 Units by mouth daily.    . fluticasone (FLONASE) 50 MCG/ACT nasal spray Place 1 spray into both nostrils daily as needed for allergies or rhinitis.    . Melatonin 3 MG CAPS Take 1 capsule (3 mg total) by mouth at bedtime. 30 capsule 0  . montelukast (SINGULAIR) 10 MG tablet Take 10 mg by mouth every evening.    Marland Kitchen losartan-hydrochlorothiazide (HYZAAR) 100-25 MG per tablet Take 1 tablet by mouth daily.     No facility-administered medications prior to visit.     PAST MEDICAL HISTORY: Past Medical History:  Diagnosis Date  . Arthritis    back  . Cancer [facility])    prostate  . Diabetes mellitus without complication ([place])    on meds  . Diverticulosis   . Hypercholesteremia   . Hypertension   . Meatal stenosis   . Osteoarthritis   . Sinus drainage   . Tinea pedis     PAST SURGICAL HISTORY: Past Surgical History:  Procedure Laterality Date  . BACK SURGERY  1984, 2013   lumbar  . DG DILATION URETERS  2003  . EYE  SURGERY Bilateral 1999   Lasik  . POSTERIOR LUMBAR FUSION 4 LEVEL N/A 06/01/2014   Procedure: LUMBAR THREE TO FOUR, LUMBAR FOUR TO FIVE LAMINECTOMY,  RIGHT LUMBAR FUSION AT LUMBAR FOUR TO FIVE;  Surgeon: Floyce Stakes, MD;  Location: [facility];  Service: Neurosurgery;  Laterality: N/A;  POSSIBLE L2-3 L3-4 L4-5 L5-S1 POSTERIOR LUMBAR INTERBODY FUSION  . PROSTATECTOMY  2002    FAMILY HISTORY: History reviewed. No pertinent family history.  SOCIAL HISTORY: Social History   Social History  . Marital status: Married    Spouse name: N/A  . Number of children: N/A  . Years of education: N/A  Occupational History  . Not on file.   Social History Main Topics  . Smoking status: Former Smoker    Packs/day: 1.50    Years: 20.00    Types: Cigarettes    Quit date: 02/01/1991  . Smokeless tobacco: Never Used  . Alcohol use Yes     Comment: occasionally  . Drug use: No  . Sexual activity: Not on file   Other Topics Concern  . Not on file   Social History Narrative  . No narrative on file      PHYSICAL EXAM  Vitals:   12/23/16 0917  BP: 119/75  Pulse: 81  Weight: 191 lb 9.6 oz (86.9 kg)   Body mass index is 27.49 kg/m.  Generalized: Well developed, in no acute distress   Neurological examination  Mentation: Alert oriented to time, place, history taking. Follows all commands speech and language fluent Cranial nerve II-XII: Pupils were equal round reactive to light. Extraocular movements were full, visual field were full on confrontational test. Facial sensation and strength were normal. Uvula tongue midline. Head turning and shoulder shrug  were normal and symmetric. Motor: The motor testing reveals 5 over 5 strength of all 4 extremities. Very mild rigidity noted in right upper extremity. Sensory: Sensory testing is intact to soft touch on all 4 extremities. No evidence of extinction is noted.  Coordination: Cerebellar testing reveals good finger-nose-finger and  heel-to-shin bilaterally. Intention tremor noted in both hands.  Gait and station: Gait is normal. Tandem gait is unsteady. Romberg is negative. No drift is seen.  Reflexes: Deep tendon reflexes are symmetric and normal bilaterally.   DIAGNOSTIC DATA (LABS, IMAGING, TESTING) - I reviewed patient records, labs, notes, testing and imaging myself where available.  Lab Results  Component Value Date   WBC 6.9 05/24/2014   HGB 14.7 05/24/2014   HCT 45.5 05/24/2014   MCV 91.2 05/24/2014   PLT 207 05/24/2014      Component Value Date/Time   NA 139 05/24/2014 1221   K 4.0 05/24/2014 1221   CL 101 05/24/2014 1221   CO2 28 05/24/2014 1221   GLUCOSE 103 (H) 05/24/2014 1221   BUN 13 05/24/2014 1221   CREATININE 1.19 05/24/2014 1221   CALCIUM 10.1 05/24/2014 1221   GFRNONAA 62 (L) 05/24/2014 1221   GFRAA 72 (L) 05/24/2014 1221      ASSESSMENT AND PLAN 71 y.o. year old male  has a past medical history of Arthritis; Cancer ([place]); Diabetes mellitus without complication ([place]); Diverticulosis; Hypercholesteremia; Hypertension; Meatal stenosis; Osteoarthritis; Sinus drainage; and Tinea pedis. here with :  1. REM sleep behavior disorder 2. Tremor  The patient is encouraged to try melatonin 3-6 mg 30 minutes to 1 hour before bedtime. On exam it appears that the patient may have an essential tremor however we will continue to monitor for symptoms related to Parkinson's. Patient is amenable to this plan. He is symptoms worsen or he develops new symptoms he should let us know. He'll follow-up in 6 months or sooner if needed.    Ward Givens, MSN, NP-C 12/23/2016, 9:35 AM [facility] [address] [place], [place] [address] [phone]

## 2016-12-24 NOTE — Progress Notes (Signed)
I agree with the assessment and plan as directed by NP .The patient is known to me .   Ruqaya Strauss, MD  

## 2017-01-30 DIAGNOSIS — M25569 Pain in unspecified knee: Secondary | ICD-10-CM | POA: Diagnosis not present

## 2017-01-30 DIAGNOSIS — M79642 Pain in left hand: Secondary | ICD-10-CM | POA: Diagnosis not present

## 2017-01-30 DIAGNOSIS — M170 Bilateral primary osteoarthritis of knee: Secondary | ICD-10-CM | POA: Diagnosis not present

## 2017-01-30 DIAGNOSIS — M150 Primary generalized (osteo)arthritis: Secondary | ICD-10-CM | POA: Diagnosis not present

## 2017-02-10 DIAGNOSIS — H43813 Vitreous degeneration, bilateral: Secondary | ICD-10-CM | POA: Diagnosis not present

## 2017-03-05 DIAGNOSIS — Z0000 Encounter for general adult medical examination without abnormal findings: Secondary | ICD-10-CM | POA: Diagnosis not present

## 2017-03-05 DIAGNOSIS — E7800 Pure hypercholesterolemia, unspecified: Secondary | ICD-10-CM | POA: Diagnosis not present

## 2017-03-05 DIAGNOSIS — I10 Essential (primary) hypertension: Secondary | ICD-10-CM | POA: Diagnosis not present

## 2017-03-05 DIAGNOSIS — Z23 Encounter for immunization: Secondary | ICD-10-CM | POA: Diagnosis not present

## 2017-03-05 DIAGNOSIS — E118 Type 2 diabetes mellitus with unspecified complications: Secondary | ICD-10-CM | POA: Diagnosis not present

## 2017-03-05 DIAGNOSIS — Z125 Encounter for screening for malignant neoplasm of prostate: Secondary | ICD-10-CM | POA: Diagnosis not present

## 2017-03-10 DIAGNOSIS — E7800 Pure hypercholesterolemia, unspecified: Secondary | ICD-10-CM | POA: Diagnosis not present

## 2017-03-10 DIAGNOSIS — R05 Cough: Secondary | ICD-10-CM | POA: Diagnosis not present

## 2017-03-10 DIAGNOSIS — N183 Chronic kidney disease, stage 3 (moderate): Secondary | ICD-10-CM | POA: Diagnosis not present

## 2017-03-10 DIAGNOSIS — E118 Type 2 diabetes mellitus with unspecified complications: Secondary | ICD-10-CM | POA: Diagnosis not present

## 2017-03-10 DIAGNOSIS — Z789 Other specified health status: Secondary | ICD-10-CM | POA: Diagnosis not present

## 2017-03-10 DIAGNOSIS — R292 Abnormal reflex: Secondary | ICD-10-CM | POA: Diagnosis not present

## 2017-03-10 DIAGNOSIS — Z6827 Body mass index (BMI) 27.0-27.9, adult: Secondary | ICD-10-CM | POA: Diagnosis not present

## 2017-03-10 DIAGNOSIS — G4752 REM sleep behavior disorder: Secondary | ICD-10-CM | POA: Diagnosis not present

## 2017-03-10 DIAGNOSIS — I10 Essential (primary) hypertension: Secondary | ICD-10-CM | POA: Diagnosis not present

## 2017-03-10 DIAGNOSIS — G250 Essential tremor: Secondary | ICD-10-CM | POA: Diagnosis not present

## 2017-03-10 DIAGNOSIS — K5730 Diverticulosis of large intestine without perforation or abscess without bleeding: Secondary | ICD-10-CM | POA: Diagnosis not present

## 2017-03-17 DIAGNOSIS — H43813 Vitreous degeneration, bilateral: Secondary | ICD-10-CM | POA: Diagnosis not present

## 2017-05-19 DIAGNOSIS — E118 Type 2 diabetes mellitus with unspecified complications: Secondary | ICD-10-CM | POA: Diagnosis not present

## 2017-05-19 DIAGNOSIS — I10 Essential (primary) hypertension: Secondary | ICD-10-CM | POA: Diagnosis not present

## 2017-06-06 DIAGNOSIS — C61 Malignant neoplasm of prostate: Secondary | ICD-10-CM | POA: Diagnosis not present

## 2017-06-25 ENCOUNTER — Ambulatory Visit (INDEPENDENT_AMBULATORY_CARE_PROVIDER_SITE_OTHER): Payer: Medicare Other | Admitting: Neurology

## 2017-06-25 ENCOUNTER — Encounter: Payer: Self-pay | Admitting: Neurology

## 2017-06-25 VITALS — BP 119/75 | HR 71 | Ht 70.0 in | Wt 191.0 lb

## 2017-06-25 DIAGNOSIS — R251 Tremor, unspecified: Secondary | ICD-10-CM | POA: Diagnosis not present

## 2017-06-25 DIAGNOSIS — G4752 REM sleep behavior disorder: Secondary | ICD-10-CM

## 2017-06-25 MED ORDER — CLONAZEPAM 0.5 MG PO TABS: ORAL_TABLET | ORAL | 1 refills | Status: DC

## 2017-06-25 NOTE — Progress Notes (Signed)
SLEEP MEDICINE CLINIC   Provider:  Larey Seat, M D  Referring Provider: Deland Pretty, MD Primary Care Physician:  Deland Pretty, MD  Chief Complaint  Patient presents with  . Follow-up    pt with wife, rm 56, pt states still struggles with sleep. taking the melatonin but not every night.     HPI:  Tyler Hayes is a 72 y.o. male , seen here as a  revisit  from Dr. Shelia Media for  REM Behavior disorder,   Chief complaint according to patient : Tyler Hayes stated " swinging, kicking and jumping in his sleep", also he mentioned that he developed a " right hand tremor". Tyler Hayes reports that he acted out dreams sometimes with kicking more swaying even in his 81s. He is now in his second marriage but he remembers that his first wife was complaining about his abrupt movements and sometimes kicking her or bruising her. She died of leukemia in 11-Sep-2000, 15 years ago. He also reports that he used to be physically so much more active before he retired that he felt he got deeper and more restorative sleep simply because he was physically in need of sleep. He believes that acting out dreams has increased since his retirement, which began in 2011-09-12. He used to be a Holiday representative.  Sleep habits are as follows:He likes to retreat to the bedroom around 10 PM, relaxes there , reads and plays solitaire. Around 11:30 PM is when he usually initiate sleep. He has discovered that he doesn't sleep well if he watches TV close to bedtime and therefore eliminated this. He shares a bedroom with his wife, the bedroom is cool ,quiet and dark, he sleeps on multiple pillows.He also reports that he most nights he sleeps well through the night but sometimes he has very vivid dreams and he tends to sleep on the right side, and his acting out of dreams is also correlated to sleeping on the right side. He avoids the supine sleep position as it causes his choking on sinus drainage. Acting out his dreams seems to start later  than an hour into sleep and may last well into the morning hours usually he has only one episode of dream enactment and not multiple. Usually his dreams include being threatened, followed, protecting himself or his wife. Sometimes he feels that sometimes he tries to bite his legs fand he will flex and inadvertently kicked her. His wife will try to wake him up. He has left the bed . He is trembling and she will call out and wake him. If he goes back to sleep right away sometimes the dream will continue with the same incident. He wakes up always with a dry mouth but doesn't deny any headaches, palpitations or diaphoresis. He has tickling in his throat.  Sleep related medical history and family history:  Father had a tremor, died of a brain tumor while patient was in high school.  Social history:  Retired  Re -married, adult 2 sons and one step son , adult children, he quit smoking in 09/12/1990 , after  35 yeas, and 20 pack years. ETOH;  Seldom,  caffeine :2-3 cups in AM, I one glass of iced tea in PM,and 3 cokes a day.    Interval history from 06/26/2016, I have pleasure of seeing Mr. and Mrs. Hayes today following a sleep study from 04/21/2016 the patient had no significant apnea, his AHI was 2.3 his REM AHI was 5.7 which is still considered  low. He did not have any measurable apnea if he didn't sleep on his back. 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  Tyler Hayes was last seen on 23 December 2016 by our nurse practitioner Tyler Hayes, and he is here today for a 6 months interval revisit on 25 June 2017.  Tyler Hayes reports that he still takes daytime naps but his residual sleepiness during the day has been manageable.  He endorsed the Epworth Sleepiness Scale at 11 points which is just a little above average.  Fatigue was only  endorsed at 16 points.  In his sleep study but took place exactly a year ago there was no significant sleep apnea noted.  He did have REM sleep behavior during the study- and his wife is reporting still active sleep behavior. He feels is less severe, but it is still a treat to his wife.  She has noticed that the dream behavior can occur several times in the same night, every night of the week. He sits up, screams and kicks. He seems to fight.  Lasts 2 minutes or less. Last night he was crying, and moving. He has fallen out of bed. He uses a bed rail, and still fell out. Melatonin has helped but not eliminated the REM BD.    Review of Systems: Out of a complete 14 system review, the patient complains of only the following symptoms, and all other reviewed systems are negative.  REM BD. Some kicking, thrashing, yelling.  EDS with 11 points Epworth-  but  with naps in daytime.  FSS at 16 points.   Social History   Socioeconomic History  . Marital status: Married    Spouse name: Not on file  . Number of children: Not on file  . Years of education: Not on file  . Highest education level: Not on file  Social Needs  . Financial resource strain: Not on file  . Food insecurity - worry: Not on file  . Food insecurity - inability: Not on file  . Transportation needs - medical: Not on file  . Transportation needs - non-medical: Not on file  Occupational History  . Not on file  Tobacco Use  . Smoking status: Former Smoker    Packs/day: 1.50    Years: 20.00    Pack years: 30.00    Types: Cigarettes    Last attempt to quit: 02/01/1991    Years since quitting: 26.4  . Smokeless tobacco: Never Used  Substance and Sexual Activity  . Alcohol use: Yes    Comment: occasionally  . Drug use: No  . Sexual activity: Not on file  Other Topics Concern  . Not on file  Social History Narrative  . Not on file    No family history on file.  Past Medical History:  Diagnosis Date  . Arthritis     back  . Cancer [facility])    prostate  . Diabetes mellitus without complication ([place])    on meds  . Diverticulosis   . Hypercholesteremia   . Hypertension   . Meatal stenosis   . Osteoarthritis   . Sinus drainage   . Tinea pedis     Past Surgical History:  Procedure Laterality Date  . BACK SURGERY  1984, 2013   lumbar  . DG DILATION URETERS  2003  .  EYE SURGERY Bilateral 1999   Lasik  . POSTERIOR LUMBAR FUSION 4 LEVEL N/A 06/01/2014   Procedure: LUMBAR THREE TO FOUR, LUMBAR FOUR TO FIVE LAMINECTOMY,  RIGHT LUMBAR FUSION AT LUMBAR FOUR TO FIVE;  Surgeon: Tyler Stakes, MD;  Location: [facility];  Service: Neurosurgery;  Laterality: N/A;  POSSIBLE L2-3 L3-4 L4-5 L5-S1 POSTERIOR LUMBAR INTERBODY FUSION  . PROSTATECTOMY  2002    Current Outpatient Medications  Medication Sig Dispense Refill  . aspirin EC 81 MG tablet Take 81 mg by mouth daily.    Marland Kitchen atorvastatin (LIPITOR) 10 MG tablet Take 10 mg by mouth daily.    . Canagliflozin-Metformin HCl (INVOKAMET) 50-1000 MG TABS Take 1 tablet by mouth 2 (two) times daily.    . Cholecalciferol (VITAMIN D-3) 1000 UNITS CAPS Take 1,000 Units by mouth daily.    . fluticasone (FLONASE) 50 MCG/ACT nasal spray Place 1 spray into both nostrils daily as needed for allergies or rhinitis.    . Melatonin 3 MG CAPS Take 1 capsule (3 mg total) by mouth at bedtime. 30 capsule 0  . montelukast (SINGULAIR) 10 MG tablet Take 10 mg by mouth every evening.    . triamterene-hydrochlorothiazide (MAXZIDE-25) 37.5-25 MG tablet Take 1 tablet by mouth daily.      No current facility-administered medications for this visit.     Allergies as of 06/25/2017 - Review Complete 06/25/2017  Allergen Reaction Noted  . Ace inhibitors  03/07/2016    Vitals: BP 119/75   Pulse 71   Ht 5\' 10"  (1.778 m)   Wt 191 lb (86.6 kg)   BMI 27.41 kg/m  Last Weight:  Wt Readings from Last 1 Encounters:  06/25/17 191 lb (86.6 kg)   EHM:CNOB mass index is 27.41 kg/m.     Last  Height:   Ht Readings from Last 1 Encounters:  06/25/17 5\' 10"  (1.778 m)    Physical exam:  General: The patient is awake, alert and  well groomed. Head: Normocephalic, atraumatic.  Neck is supple. Mallampati 2 neck circumference:16.25,  Nasal airflow patent.  No Retrognathia - Full dentures.  Cardiovascular:  Regular rate and rhythm, without murmurs or carotid bruit, and without distended neck veins. Respiratory: Lungs are clear to auscultation. Skin:  Without evidence of edema, or rash Trunk: BMI is low. The patient's posture is erect.   Neurologic exam : The patient is awake and alert, oriented to place and time.   Memory subjective described as intact.  Attention span & concentration ability appears normal.  His wife also does not feel that he has memory loss. Speech is fluent,  without dysarthria, dysphonia or aphasia.  Mood and affect are appropriate.  Cranial nerves: Preserved sense of  taste and smell. Pupils are equally reactive to light. Right eye status post cataract surgery.  Extraocular movements in vertical and horizontal planes intact and without nystagmus.  Visual fields by finger perimetry are intact. Hearing to finger rub intact. Facial sensation intact to fine touch. Facial motor strength is symmetric and tongue and uvula move midline. Shoulder shrug was symmetrical.   Motor exam:  Tyler Hayes presents with cog-wheeling rigidity over the right biceps only, slight increase in tone at the right wrist.  There is a right hand resting tremor and action tremor noted. None of these findings in the left hand.  He noticed a change in his handwriting and that he has to concentrate on bringing food to his mouth.   Sensory:  Fine touch, pinprick and vibration  were normal. Coordination: Rapid alternating movements in the fingers/hands was normal.  Finger-to-nose maneuver with right hand tremor. He feels clumsy- with handwriting changes.  Gait and station: Patient walks without  assistive device and is able unassisted to climb up to the exam table. Strength within normal limits. Stance is stable and normal.   Deep tendon reflexes: in the  upper and lower extremities are symmetric and intact. Babinski maneuver response is downgoing. The patient was advised of the nature of the diagnosed sleep disorder , the treatment options and risks for general a health and wellness arising from not treating the condition.   I spent more than 25  minutes of face to face time with the patient.  I truly appreciated that Mrs. Hayes came to this visit with her husband and her input has helped greatly. 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 I would like for Tyler Hayes to follow [place] every 6 months to see if there is a development in terms of a less essential and more resting tremor, rigidity or memory loss.  Greater than 50% of time was spent in counseling and coordination of care. We have discussed the diagnosis and differential and I answered the patient's questions.     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 there is actually very little resting tremor but more of an action tremor noted but it is associated with biceps rigidity. This is a mixed finding but in light of the described parasomnia activity I would keep in mind that he may develop Parkinson later on  - at this time I would not diagnose him. His sleep study has confirmed that he has REM behavior related enactment of visit dreams.  He is at a higher risk of converting to PD.   Melatonin at 3 mg taken 30 minutes before  intended bedtime each night.  If melatonin doesn't work will use 0.25 mg Klonopin. Will follow up with NP in 6 month; MOCA and MMSE.    Tyler Hayes Esa Raden MD  06/25/2017   CC: Deland Pretty, [address] [place], [place] [address]

## 2017-06-25 NOTE — Patient Instructions (Signed)
Clonazepam tablets What is this medicine? CLONAZEPAM (kloe NA ze pam) is a benzodiazepine. It is used to treat certain types of seizures. It is also used to treat panic disorder. This medicine may be used for other purposes; ask your health care provider or pharmacist if you have questions. COMMON BRAND NAME(S): Ceberclon, Klonopin What should I tell my health care provider before I take this medicine? They need to know if you have any of these conditions: -an alcohol or drug abuse problem -bipolar disorder, depression, psychosis or other mental health condition -glaucoma -kidney or liver disease -lung or breathing disease -myasthenia gravis -Parkinson's disease -porphyria -seizures or a history of seizures -suicidal thoughts -an unusual or allergic reaction to clonazepam, other benzodiazepines, foods, dyes, or preservatives -pregnant or trying to get pregnant -breast-feeding How should I use this medicine? Take this medicine by mouth with a glass of water. Follow the directions on the prescription label. If it upsets your stomach, take it with food or milk. Take your medicine at regular intervals. Do not take it more often than directed. Do not stop taking or change the dose except on the advice of your doctor or health care professional. A special MedGuide will be given to you by the pharmacist with each prescription and refill. Be sure to read this information carefully each time. Talk to your pediatrician regarding the use of this medicine in children. Special care may be needed. Overdosage: If you think you have taken too much of this medicine contact a poison control center or emergency room at once. NOTE: This medicine is only for you. Do not share this medicine with others. What if I miss a dose? If you miss a dose, take it as soon as you can. If it is almost time for your next dose, take only that dose. Do not take double or extra doses. What may interact with this medicine? Do  not take this medication with any of the following medicines: -narcotic medicines for cough -sodium oxybate This medicine may also interact with the following medications: -alcohol -antihistamines for allergy, cough and cold -antiviral medicines for HIV or AIDS -certain medicines for anxiety or sleep -certain medicines for depression, like amitriptyline, fluoxetine, sertraline -certain medicines for fungal infections like ketoconazole and itraconazole -certain medicines for seizures like carbamazepine, phenobarbital, phenytoin, primidone -general anesthetics like halothane, isoflurane, methoxyflurane, propofol -local anesthetics like lidocaine, pramoxine, tetracaine -medicines that relax muscles for surgery -narcotic medicines for pain -phenothiazines like chlorpromazine, mesoridazine, prochlorperazine, thioridazine This list may not describe all possible interactions. Give your health care provider a list of all the medicines, herbs, non-prescription drugs, or dietary supplements you use. Also tell them if you smoke, drink alcohol, or use illegal drugs. Some items may interact with your medicine. What should I watch for while using this medicine? Tell your doctor or health care professional if your symptoms do not start to get better or if they get worse. Do not stop taking except on your doctor's advice. You may develop a severe reaction. Your doctor will tell you how much medicine to take. You may get drowsy or dizzy. Do not drive, use machinery, or do anything that needs mental alertness until you know how this medicine affects you. To reduce the risk of dizzy and fainting spells, do not stand or sit up quickly, especially if you are an older patient. Alcohol may increase dizziness and drowsiness. Avoid alcoholic drinks. If you are taking another medicine that also causes drowsiness, you may have more side   effects. Give your health care provider a list of all medicines you use. Your doctor  will tell you how much medicine to take. Do not take more medicine than directed. Call emergency for help if you have problems breathing or unusual sleepiness. The use of this medicine may increase the chance of suicidal thoughts or actions. Pay special attention to how you are responding while on this medicine. Any worsening of mood, or thoughts of suicide or dying should be reported to your health care professional right away. What side effects may I notice from receiving this medicine? Side effects that you should report to your doctor or health care professional as soon as possible: -allergic reactions like skin rash, itching or hives, swelling of the face, lips, or tongue -breathing problems -confusion -loss of balance or coordination -signs and symptoms of low blood pressure like dizziness; feeling faint or lightheaded, falls; unusually weak or tired -suicidal thoughts or mood changes Side effects that usually do not require medical attention (report to your doctor or health care professional if they continue or are bothersome): -dizziness -headache -tiredness -upset stomach This list may not describe all possible side effects. Call your doctor for medical advice about side effects. You may report side effects to FDA at 1-800-FDA-1088. Where should I keep my medicine? Keep out of the reach of children. This medicine can be abused. Keep your medicine in a safe place to protect it from theft. Do not share this medicine with anyone. Selling or giving away this medicine is dangerous and against the law. This medicine may cause accidental overdose and death if taken by other adults, children, or pets. Mix any unused medicine with a substance like cat litter or coffee grounds. Then throw the medicine away in a sealed container like a sealed bag or a coffee can with a lid. Do not use the medicine after the expiration date. Store at room temperature between 15 and 30 degrees C (59 and 86 degrees F).  Protect from light. Keep container tightly closed. NOTE: This sheet is a summary. It may not cover all possible information. If you have questions about this medicine, talk to your doctor, pharmacist, or health care provider.  2018 Elsevier/Gold Standard (2015-10-13 18:46:32)  

## 2017-07-02 DIAGNOSIS — H26493 Other secondary cataract, bilateral: Secondary | ICD-10-CM | POA: Diagnosis not present

## 2017-07-02 DIAGNOSIS — H52203 Unspecified astigmatism, bilateral: Secondary | ICD-10-CM | POA: Diagnosis not present

## 2017-07-02 DIAGNOSIS — E119 Type 2 diabetes mellitus without complications: Secondary | ICD-10-CM | POA: Diagnosis not present

## 2017-07-15 DIAGNOSIS — E118 Type 2 diabetes mellitus with unspecified complications: Secondary | ICD-10-CM | POA: Diagnosis not present

## 2017-07-15 DIAGNOSIS — I10 Essential (primary) hypertension: Secondary | ICD-10-CM | POA: Diagnosis not present

## 2017-07-30 DIAGNOSIS — M79642 Pain in left hand: Secondary | ICD-10-CM | POA: Diagnosis not present

## 2017-07-30 DIAGNOSIS — M19041 Primary osteoarthritis, right hand: Secondary | ICD-10-CM | POA: Diagnosis not present

## 2017-07-30 DIAGNOSIS — D8989 Other specified disorders involving the immune mechanism, not elsewhere classified: Secondary | ICD-10-CM | POA: Diagnosis not present

## 2017-07-30 DIAGNOSIS — M79641 Pain in right hand: Secondary | ICD-10-CM | POA: Diagnosis not present

## 2017-07-30 DIAGNOSIS — M19042 Primary osteoarthritis, left hand: Secondary | ICD-10-CM | POA: Diagnosis not present

## 2017-07-30 DIAGNOSIS — M150 Primary generalized (osteo)arthritis: Secondary | ICD-10-CM | POA: Diagnosis not present

## 2017-07-30 DIAGNOSIS — M79643 Pain in unspecified hand: Secondary | ICD-10-CM | POA: Diagnosis not present

## 2017-07-30 DIAGNOSIS — M1990 Unspecified osteoarthritis, unspecified site: Secondary | ICD-10-CM | POA: Diagnosis not present

## 2017-07-30 DIAGNOSIS — M170 Bilateral primary osteoarthritis of knee: Secondary | ICD-10-CM | POA: Diagnosis not present

## 2017-07-30 DIAGNOSIS — M25569 Pain in unspecified knee: Secondary | ICD-10-CM | POA: Diagnosis not present

## 2017-07-30 DIAGNOSIS — M79645 Pain in left finger(s): Secondary | ICD-10-CM | POA: Diagnosis not present

## 2017-08-07 DIAGNOSIS — N183 Chronic kidney disease, stage 3 (moderate): Secondary | ICD-10-CM | POA: Diagnosis not present

## 2017-08-28 DIAGNOSIS — H26492 Other secondary cataract, left eye: Secondary | ICD-10-CM | POA: Diagnosis not present

## 2017-09-11 DIAGNOSIS — H26491 Other secondary cataract, right eye: Secondary | ICD-10-CM | POA: Diagnosis not present

## 2017-10-01 DIAGNOSIS — M79643 Pain in unspecified hand: Secondary | ICD-10-CM | POA: Diagnosis not present

## 2017-10-01 DIAGNOSIS — I10 Essential (primary) hypertension: Secondary | ICD-10-CM | POA: Diagnosis not present

## 2017-10-01 DIAGNOSIS — R768 Other specified abnormal immunological findings in serum: Secondary | ICD-10-CM | POA: Diagnosis not present

## 2017-10-01 DIAGNOSIS — M1990 Unspecified osteoarthritis, unspecified site: Secondary | ICD-10-CM | POA: Diagnosis not present

## 2017-10-22 DIAGNOSIS — Z79899 Other long term (current) drug therapy: Secondary | ICD-10-CM | POA: Diagnosis not present

## 2017-10-31 DIAGNOSIS — S31815A Open bite of right buttock, initial encounter: Secondary | ICD-10-CM | POA: Diagnosis not present

## 2017-11-07 DIAGNOSIS — E118 Type 2 diabetes mellitus with unspecified complications: Secondary | ICD-10-CM | POA: Diagnosis not present

## 2017-11-11 DIAGNOSIS — E118 Type 2 diabetes mellitus with unspecified complications: Secondary | ICD-10-CM | POA: Diagnosis not present

## 2017-11-11 DIAGNOSIS — I10 Essential (primary) hypertension: Secondary | ICD-10-CM | POA: Diagnosis not present

## 2017-11-12 DIAGNOSIS — M1990 Unspecified osteoarthritis, unspecified site: Secondary | ICD-10-CM | POA: Diagnosis not present

## 2017-11-12 DIAGNOSIS — R768 Other specified abnormal immunological findings in serum: Secondary | ICD-10-CM | POA: Diagnosis not present

## 2017-11-12 DIAGNOSIS — M79643 Pain in unspecified hand: Secondary | ICD-10-CM | POA: Diagnosis not present

## 2017-12-24 ENCOUNTER — Ambulatory Visit (INDEPENDENT_AMBULATORY_CARE_PROVIDER_SITE_OTHER): Payer: Medicare Other | Admitting: Adult Health

## 2017-12-24 ENCOUNTER — Encounter: Payer: Self-pay | Admitting: Adult Health

## 2017-12-24 VITALS — BP 118/74 | HR 74 | Ht 70.0 in | Wt 189.8 lb

## 2017-12-24 DIAGNOSIS — G4752 REM sleep behavior disorder: Secondary | ICD-10-CM

## 2017-12-24 DIAGNOSIS — G250 Essential tremor: Secondary | ICD-10-CM | POA: Diagnosis not present

## 2017-12-24 NOTE — Progress Notes (Signed)
PATIENT: Tyler Hayes DOB: 06/25/1945  REASON FOR VISIT: follow up HISTORY FROM: patient  HISTORY OF PRESENT ILLNESS: Today 12/24/17 Tyler Hayes is a 72 year old male with a history of REM sleep behavior disorder and essential tremor.  He returns today for follow-up.  He reports that he took Klonopin one time.  He states that it did make him sleep well but it did make him feel sluggish the next day.  He states that he typically does not use it when he is at home.  He reports that if he is traveling alone he will consider it.  He reports that he continues to thrash about in bed during the night.  However his wife typically can wake him up and it stops.  He reports that his tremor is  relatively stable but is worse at times.  He states that he notices it with his handwriting and when using a keyboard and when feeding himself.  He denies any other symptoms.  He returns today for evaluation.  HISTORY [name] stated " swinging, kicking and jumping in his sleep", also he mentioned that he developed a " right hand tremor". [name] reports that he acted out dreams sometimes with kicking more swaying even in his 46s. He is now in his second marriage but he remembers that his first wife was complaining about his abrupt movements and sometimes kicking her or bruising her. She died of leukemia in 08-15-00, 15 years ago. He also reports that he used to be physically so much more active before he retired that he felt he got deeper and more restorative sleep simply because he was physically in need of sleep. He believes that acting out dreams has increased since his retirement, which began in 08/16/11. He used to be a Holiday representative.  Sleep habits are as follows:He likes to retreat to the bedroom around 10 PM, relaxes there , reads and plays solitaire. Around 11:30 PM is when he usually initiate sleep. He has discovered that he doesn't sleep well if he watches TV close to bedtime and therefore eliminated  this. He shares a bedroom with his wife, the bedroom is cool ,quiet and dark, he sleeps on multiple pillows.He also reports that he most nights he sleeps well through the night but sometimes he has very vivid dreams and he tends to sleep on the right side, and his acting out of dreams is also correlated to sleeping on the right side. He avoids the supine sleep position as it causes his choking on sinus drainage. Acting out his dreams seems to start later than an hour into sleep and may last well into the morning hours usually he has only one episode of dream enactment and not multiple. Usually his dreams include being threatened, followed, protecting himself or his wife. Sometimes he feels that sometimes he tries to bite his legs fand he will flex and inadvertently kicked her. His wife will try to wake him up. He has left the bed . He is trembling and she will call out and wake him. If he goes back to sleep right away sometimes the dream will continue with the same incident. He wakes up always with a dry mouth but doesn't deny any headaches, palpitations or diaphoresis. He has tickling in his throat.  Sleep related medical history and family history:  Father had a tremor, died of a brain tumor while patient was in high school.    REVIEW OF SYSTEMS: Out of a complete 14  system review of symptoms, the patient complains only of the following symptoms, and all other reviewed systems are negative.  See HPI  ALLERGIES: Allergies  Allergen Reactions  . Ace Inhibitors     HOME MEDICATIONS: Outpatient Medications Prior to Visit  Medication Sig Dispense Refill  . aspirin EC 81 MG tablet Take 81 mg by mouth daily.    Marland Kitchen atorvastatin (LIPITOR) 10 MG tablet Take 10 mg by mouth daily.    . Canagliflozin-Metformin HCl (INVOKAMET) 50-1000 MG TABS Take 1 tablet by mouth 2 (two) times daily.    . Cholecalciferol (VITAMIN D-3) 1000 UNITS CAPS Take 1,000 Units by mouth daily.    . clonazePAM (KLONOPIN) 0.5 MG  tablet At bedtime prn. 30 tablet 1  . fluticasone (FLONASE) 50 MCG/ACT nasal spray Place 1 spray into both nostrils daily as needed for allergies or rhinitis.    . Melatonin 3 MG CAPS Take 1 capsule (3 mg total) by mouth at bedtime. 30 capsule 0  . montelukast (SINGULAIR) 10 MG tablet Take 10 mg by mouth every evening.    . triamterene-hydrochlorothiazide (MAXZIDE-25) 37.5-25 MG tablet Take 1 tablet by mouth daily.      No facility-administered medications prior to visit.     PAST MEDICAL HISTORY: Past Medical History:  Diagnosis Date  . Arthritis    back  . Cancer [facility])    prostate  . Diabetes mellitus without complication ([place])    on meds  . Diverticulosis   . Hypercholesteremia   . Hypertension   . Meatal stenosis   . Osteoarthritis   . Sinus drainage   . Tinea pedis     PAST SURGICAL HISTORY: Past Surgical History:  Procedure Laterality Date  . BACK SURGERY  1984, 2013   lumbar  . DG DILATION URETERS  2003  . EYE SURGERY Bilateral 1999   Lasik  . POSTERIOR LUMBAR FUSION 4 LEVEL N/A 06/01/2014   Procedure: LUMBAR THREE TO FOUR, LUMBAR FOUR TO FIVE LAMINECTOMY,  RIGHT LUMBAR FUSION AT LUMBAR FOUR TO FIVE;  Surgeon: Floyce Stakes, MD;  Location: [facility];  Service: Neurosurgery;  Laterality: N/A;  POSSIBLE L2-3 L3-4 L4-5 L5-S1 POSTERIOR LUMBAR INTERBODY FUSION  . PROSTATECTOMY  2002    FAMILY HISTORY: No family history on file.  SOCIAL HISTORY: Social History   Socioeconomic History  . Marital status: Married    Spouse name: Not on file  . Number of children: Not on file  . Years of education: Not on file  . Highest education level: Not on file  Occupational History  . Not on file  Social Needs  . Financial resource strain: Not on file  . Food insecurity:    Worry: Not on file    Inability: Not on file  . Transportation needs:    Medical: Not on file    Non-medical: Not on file  Tobacco Use  . Smoking status: Former Smoker    Packs/day: 1.50     Years: 20.00    Pack years: 30.00    Types: Cigarettes    Last attempt to quit: 02/01/1991    Years since quitting: 26.9  . Smokeless tobacco: Never Used  Substance and Sexual Activity  . Alcohol use: Yes    Comment: occasionally  . Drug use: No  . Sexual activity: Not on file  Lifestyle  . Physical activity:    Days per week: Not on file    Minutes per session: Not on file  . Stress: Not on  file  Relationships  . Social connections:    Talks on phone: Not on file    Gets together: Not on file    Attends religious service: Not on file    Active member of club or organization: Not on file    Attends meetings of clubs or organizations: Not on file    Relationship status: Not on file  . Intimate partner violence:    Fear of current or ex partner: Not on file    Emotionally abused: Not on file    Physically abused: Not on file    Forced sexual activity: Not on file  Other Topics Concern  . Not on file  Social History Narrative  . Not on file      PHYSICAL EXAM  Vitals:   12/24/17 1018  BP: 118/74  Pulse: 74  Weight: 189 lb 12.8 oz (86.1 kg)  Height: 5\' 10"  (1.778 m)   Body mass index is 27.23 kg/m.   MMSE - Mini Mental State Exam 12/24/2017  Orientation to time 5  Orientation to Place 5  Registration 3  Attention/ Calculation 5  Recall 1  Language- name 2 objects 2  Language- repeat 1  Language- follow 3 step command 2  Language- read & follow direction 1  Write a sentence 1  Copy design 1  Total score 27    Generalized: Well developed, in no acute distress   Neurological examination  Mentation: Alert oriented to time, place, history taking. Follows all commands speech and language fluent Cranial nerve II-XII: Pupils were equal round reactive to light. Extraocular movements were full, visual field were full on confrontational test. Facial sensation and strength were normal. Uvula tongue midline. Head turning and shoulder shrug  were normal and  symmetric. Motor: The motor testing reveals 5 over 5 strength of all 4 extremities.  Sensory: Sensory testing is intact to soft touch on all 4 extremities. No evidence of extinction is noted.  Coordination: Cerebellar testing reveals good finger-nose-finger and heel-to-shin bilaterally.  Intention tremor noted in the hands bilaterally Gait and station: Gait is normal.  Good armswing.  Tandem gait is normal. Reflexes: Deep tendon reflexes are symmetric and normal bilaterally.   DIAGNOSTIC DATA (LABS, IMAGING, TESTING) - I reviewed patient records, labs, notes, testing and imaging myself where available.  Lab Results  Component Value Date   WBC 6.9 05/24/2014   HGB 14.7 05/24/2014   HCT 45.5 05/24/2014   MCV 91.2 05/24/2014   PLT 207 05/24/2014      Component Value Date/Time   NA 139 05/24/2014 1221   K 4.0 05/24/2014 1221   CL 101 05/24/2014 1221   CO2 28 05/24/2014 1221   GLUCOSE 103 (H) 05/24/2014 1221   BUN 13 05/24/2014 1221   CREATININE 1.19 05/24/2014 1221   CALCIUM 10.1 05/24/2014 1221   GFRNONAA 62 (L) 05/24/2014 1221   GFRAA 72 (L) 05/24/2014 1221   No results found for: CHOL, HDL, LDLCALC, LDLDIRECT, TRIG, CHOLHDL No results found for: HGBA1C No results found for: VITAMINB12 No results found for: TSH    ASSESSMENT AND PLAN 72 y.o. year old male  has a past medical history of Arthritis, Cancer ([place]), Diabetes mellitus without complication ([place]), Diverticulosis, Hypercholesteremia, Hypertension, Meatal stenosis, Osteoarthritis, Sinus drainage, and Tinea pedis. here with 1.:  1.  Essential tremor 2.  REM sleep behavior disorder  The patient will continue monitoring his tremor.  If it worsens we can consider medication in the future.  He is advised  that he can try taking a half a tablet of Klonopin to see if this makes a side effects more tolerable.  At the last visit Dr. Brett Fairy requested that an MMSE be completed at this visit.   his score was 27 out of 30.  We  will continue to monitor.  The patient had no complaints regarding his memory.  Atient voiced understanding.  If his symptoms worsen or he develops new symptoms he should let us know.  He will follow-up in 6 months or sooner if needed.    Ward Givens, MSN, NP-C 12/24/2017, 9:56 AM [facility] [address] [place], [place] [address] [phone]

## 2017-12-24 NOTE — Patient Instructions (Addendum)
Your Plan:  Continue Klonopin- can't take 1/2 tablet to see if side effects tolerate Continue to monitor Tremor If your symptoms worsen or you develop new symptoms please let us know.   Thank you for coming to see [place] at [facility]. I hope we have been able to provide you high quality care today.  You may receive a patient satisfaction survey over the next few weeks. We would appreciate your feedback and comments so that we may continue to improve ourselves and the health of our patients.

## 2018-01-21 NOTE — Progress Notes (Signed)
I agree with the assessment and plan as directed by NP .The patient is known to me .   Shiann Kam, MD  

## 2018-02-12 DIAGNOSIS — M1990 Unspecified osteoarthritis, unspecified site: Secondary | ICD-10-CM | POA: Diagnosis not present

## 2018-02-12 DIAGNOSIS — R768 Other specified abnormal immunological findings in serum: Secondary | ICD-10-CM | POA: Diagnosis not present

## 2018-02-12 DIAGNOSIS — M79643 Pain in unspecified hand: Secondary | ICD-10-CM | POA: Diagnosis not present

## 2018-02-12 DIAGNOSIS — Z23 Encounter for immunization: Secondary | ICD-10-CM | POA: Diagnosis not present

## 2018-03-11 DIAGNOSIS — E119 Type 2 diabetes mellitus without complications: Secondary | ICD-10-CM | POA: Diagnosis not present

## 2018-03-11 DIAGNOSIS — I10 Essential (primary) hypertension: Secondary | ICD-10-CM | POA: Diagnosis not present

## 2018-03-11 DIAGNOSIS — Z125 Encounter for screening for malignant neoplasm of prostate: Secondary | ICD-10-CM | POA: Diagnosis not present

## 2018-03-18 DIAGNOSIS — G250 Essential tremor: Secondary | ICD-10-CM | POA: Diagnosis not present

## 2018-03-18 DIAGNOSIS — E7800 Pure hypercholesterolemia, unspecified: Secondary | ICD-10-CM | POA: Diagnosis not present

## 2018-03-18 DIAGNOSIS — E039 Hypothyroidism, unspecified: Secondary | ICD-10-CM | POA: Diagnosis not present

## 2018-03-18 DIAGNOSIS — Z0000 Encounter for general adult medical examination without abnormal findings: Secondary | ICD-10-CM | POA: Diagnosis not present

## 2018-03-18 DIAGNOSIS — R9431 Abnormal electrocardiogram [ECG] [EKG]: Secondary | ICD-10-CM | POA: Diagnosis not present

## 2018-03-18 DIAGNOSIS — K5730 Diverticulosis of large intestine without perforation or abscess without bleeding: Secondary | ICD-10-CM | POA: Diagnosis not present

## 2018-03-18 DIAGNOSIS — E118 Type 2 diabetes mellitus with unspecified complications: Secondary | ICD-10-CM | POA: Diagnosis not present

## 2018-03-18 DIAGNOSIS — N183 Chronic kidney disease, stage 3 (moderate): Secondary | ICD-10-CM | POA: Diagnosis not present

## 2018-03-18 DIAGNOSIS — Z6827 Body mass index (BMI) 27.0-27.9, adult: Secondary | ICD-10-CM | POA: Diagnosis not present

## 2018-03-18 DIAGNOSIS — Z7982 Long term (current) use of aspirin: Secondary | ICD-10-CM | POA: Diagnosis not present

## 2018-03-18 DIAGNOSIS — M179 Osteoarthritis of knee, unspecified: Secondary | ICD-10-CM | POA: Diagnosis not present

## 2018-03-18 DIAGNOSIS — I10 Essential (primary) hypertension: Secondary | ICD-10-CM | POA: Diagnosis not present

## 2018-04-08 DIAGNOSIS — R9431 Abnormal electrocardiogram [ECG] [EKG]: Secondary | ICD-10-CM | POA: Diagnosis not present

## 2018-04-08 DIAGNOSIS — E119 Type 2 diabetes mellitus without complications: Secondary | ICD-10-CM | POA: Diagnosis not present

## 2018-04-08 DIAGNOSIS — R0609 Other forms of dyspnea: Secondary | ICD-10-CM | POA: Diagnosis not present

## 2018-04-08 DIAGNOSIS — Z136 Encounter for screening for cardiovascular disorders: Secondary | ICD-10-CM | POA: Diagnosis not present

## 2018-04-21 DIAGNOSIS — E118 Type 2 diabetes mellitus with unspecified complications: Secondary | ICD-10-CM | POA: Diagnosis not present

## 2018-04-23 DIAGNOSIS — E118 Type 2 diabetes mellitus with unspecified complications: Secondary | ICD-10-CM | POA: Diagnosis not present

## 2018-04-23 DIAGNOSIS — I10 Essential (primary) hypertension: Secondary | ICD-10-CM | POA: Diagnosis not present

## 2018-04-23 DIAGNOSIS — E7800 Pure hypercholesterolemia, unspecified: Secondary | ICD-10-CM | POA: Diagnosis not present

## 2018-04-23 DIAGNOSIS — N183 Chronic kidney disease, stage 3 (moderate): Secondary | ICD-10-CM | POA: Diagnosis not present

## 2018-05-01 DIAGNOSIS — R0602 Shortness of breath: Secondary | ICD-10-CM | POA: Diagnosis not present

## 2018-05-01 DIAGNOSIS — R9431 Abnormal electrocardiogram [ECG] [EKG]: Secondary | ICD-10-CM | POA: Diagnosis not present

## 2018-05-04 DIAGNOSIS — R9431 Abnormal electrocardiogram [ECG] [EKG]: Secondary | ICD-10-CM | POA: Diagnosis not present

## 2018-05-04 DIAGNOSIS — R0602 Shortness of breath: Secondary | ICD-10-CM | POA: Diagnosis not present

## 2018-05-04 DIAGNOSIS — Z136 Encounter for screening for cardiovascular disorders: Secondary | ICD-10-CM | POA: Diagnosis not present

## 2018-05-11 DIAGNOSIS — R9431 Abnormal electrocardiogram [ECG] [EKG]: Secondary | ICD-10-CM | POA: Diagnosis not present

## 2018-05-11 DIAGNOSIS — Z136 Encounter for screening for cardiovascular disorders: Secondary | ICD-10-CM | POA: Diagnosis not present

## 2018-05-11 DIAGNOSIS — R0609 Other forms of dyspnea: Secondary | ICD-10-CM | POA: Diagnosis not present

## 2018-05-11 DIAGNOSIS — E119 Type 2 diabetes mellitus without complications: Secondary | ICD-10-CM | POA: Diagnosis not present

## 2018-05-12 ENCOUNTER — Other Ambulatory Visit: Payer: Self-pay | Admitting: *Deleted

## 2018-05-12 DIAGNOSIS — R0602 Shortness of breath: Secondary | ICD-10-CM

## 2018-05-12 DIAGNOSIS — I429 Cardiomyopathy, unspecified: Secondary | ICD-10-CM

## 2018-05-15 DIAGNOSIS — R0609 Other forms of dyspnea: Secondary | ICD-10-CM | POA: Diagnosis not present

## 2018-05-26 ENCOUNTER — Ambulatory Visit (HOSPITAL_COMMUNITY)
Admission: RE | Admit: 2018-05-26 | Discharge: 2018-05-26 | Disposition: A | Discharge status: Home / Self Care | Payer: Medicare Other | Attending: Cardiology | Admitting: Cardiology

## 2018-05-26 ENCOUNTER — Other Ambulatory Visit: Payer: Self-pay

## 2018-05-26 ENCOUNTER — Ambulatory Visit (HOSPITAL_COMMUNITY)
Admission: RE | Disposition: A | Discharge status: Home / Self Care | Payer: Self-pay | Source: Home / Self Care | Attending: Cardiology

## 2018-05-26 DIAGNOSIS — I428 Other cardiomyopathies: Secondary | ICD-10-CM

## 2018-05-26 DIAGNOSIS — I10 Essential (primary) hypertension: Secondary | ICD-10-CM | POA: Insufficient documentation

## 2018-05-26 DIAGNOSIS — R0609 Other forms of dyspnea: Secondary | ICD-10-CM | POA: Diagnosis present

## 2018-05-26 DIAGNOSIS — R9439 Abnormal result of other cardiovascular function study: Secondary | ICD-10-CM | POA: Diagnosis not present

## 2018-05-26 DIAGNOSIS — Z7982 Long term (current) use of aspirin: Secondary | ICD-10-CM | POA: Insufficient documentation

## 2018-05-26 DIAGNOSIS — Z87891 Personal history of nicotine dependence: Secondary | ICD-10-CM | POA: Diagnosis not present

## 2018-05-26 DIAGNOSIS — I2510 Atherosclerotic heart disease of native coronary artery without angina pectoris: Secondary | ICD-10-CM | POA: Insufficient documentation

## 2018-05-26 DIAGNOSIS — I447 Left bundle-branch block, unspecified: Secondary | ICD-10-CM | POA: Diagnosis not present

## 2018-05-26 DIAGNOSIS — R0789 Other chest pain: Secondary | ICD-10-CM | POA: Insufficient documentation

## 2018-05-26 DIAGNOSIS — E119 Type 2 diabetes mellitus without complications: Secondary | ICD-10-CM | POA: Diagnosis not present

## 2018-05-26 DIAGNOSIS — I429 Cardiomyopathy, unspecified: Secondary | ICD-10-CM

## 2018-05-26 HISTORY — PX: LEFT HEART CATH AND CORONARY ANGIOGRAPHY: CATH118249

## 2018-05-26 LAB — GLUCOSE, CAPILLARY
Glucose-Capillary: 152 mg/dL — ABNORMAL HIGH (ref 70–99)
Glucose-Capillary: 124 mg/dL — ABNORMAL HIGH (ref 70–99)

## 2018-05-26 SURGERY — LEFT HEART CATH AND CORONARY ANGIOGRAPHY
Anesthesia: LOCAL

## 2018-05-26 MED ORDER — NITROGLYCERIN 1 MG/10 ML FOR IR/CATH LAB
INTRA_ARTERIAL | Status: DC | PRN
  Administered 2018-05-26: 200 ug via INTRACORONARY

## 2018-05-26 MED ORDER — ACETAMINOPHEN 325 MG PO TABS: 650.0000 mg | ORAL_TABLET | ORAL | Status: DC | PRN

## 2018-05-26 MED ORDER — LIDOCAINE HCL (PF) 1 % IJ SOLN
INTRAMUSCULAR | Status: DC | PRN
  Administered 2018-05-26: 20 mL via INTRADERMAL
  Administered 2018-05-26: 2 mL via INTRADERMAL

## 2018-05-26 MED ORDER — LIDOCAINE HCL (PF) 1 % IJ SOLN
INTRAMUSCULAR | Status: AC
  Filled 2018-05-26: qty 30

## 2018-05-26 MED ORDER — SODIUM CHLORIDE 0.9% FLUSH: 3.0000 mL | INTRAVENOUS | Status: DC | PRN

## 2018-05-26 MED ORDER — SODIUM CHLORIDE 0.9 % IV SOLN: INTRAVENOUS | Status: AC

## 2018-05-26 MED ORDER — ASPIRIN 81 MG PO CHEW
81.0000 mg | CHEWABLE_TABLET | ORAL | Status: AC
  Administered 2018-05-26: 81 mg via ORAL
  Filled 2018-05-26: qty 1

## 2018-05-26 MED ORDER — SODIUM CHLORIDE 0.9 % IV SOLN: 250.0000 mL | INTRAVENOUS | Status: DC | PRN

## 2018-05-26 MED ORDER — SODIUM CHLORIDE 0.9% FLUSH: 3.0000 mL | Freq: Two times a day (BID) | INTRAVENOUS | Status: DC

## 2018-05-26 MED ORDER — FENTANYL CITRATE (PF) 100 MCG/2ML IJ SOLN
INTRAMUSCULAR | Status: AC
  Filled 2018-05-26: qty 2

## 2018-05-26 MED ORDER — HEPARIN (PORCINE) IN NACL 1000-0.9 UT/500ML-% IV SOLN
INTRAVENOUS | Status: AC
  Filled 2018-05-26: qty 1000

## 2018-05-26 MED ORDER — MIDAZOLAM HCL 2 MG/2ML IJ SOLN
INTRAMUSCULAR | Status: AC
  Filled 2018-05-26: qty 2

## 2018-05-26 MED ORDER — MIDAZOLAM HCL 2 MG/2ML IJ SOLN
INTRAMUSCULAR | Status: DC | PRN
  Administered 2018-05-26 (×2): 1 mg via INTRAVENOUS

## 2018-05-26 MED ORDER — FENTANYL CITRATE (PF) 100 MCG/2ML IJ SOLN
INTRAMUSCULAR | Status: DC | PRN
  Administered 2018-05-26 (×2): 25 ug via INTRAVENOUS

## 2018-05-26 MED ORDER — ONDANSETRON HCL 4 MG/2ML IJ SOLN: 4.0000 mg | Freq: Four times a day (QID) | INTRAMUSCULAR | Status: DC | PRN

## 2018-05-26 MED ORDER — NITROGLYCERIN 1 MG/10 ML FOR IR/CATH LAB
INTRA_ARTERIAL | Status: AC
  Filled 2018-05-26: qty 10

## 2018-05-26 MED ORDER — IOHEXOL 350 MG/ML SOLN
INTRAVENOUS | Status: DC | PRN
  Administered 2018-05-26: 30 mL via INTRAVENOUS

## 2018-05-26 MED ORDER — SODIUM CHLORIDE 0.9 % IV SOLN
INTRAVENOUS | Status: AC
  Administered 2018-05-26: 10:00:00 via INTRAVENOUS

## 2018-05-26 SURGICAL SUPPLY — 12 items
CATH INFINITI 5FR MULTPACK ANG (CATHETERS) ×1 IMPLANT
GLIDESHEATH SLEND A-KIT 6F 22G (SHEATH) ×1 IMPLANT
GUIDEWIRE INQWIRE 1.5J.035X260 (WIRE) IMPLANT
INQWIRE 1.5J .035X260CM (WIRE) ×2
KIT HEART LEFT (KITS) ×2 IMPLANT
KIT MICROPUNCTURE NIT STIFF (SHEATH) ×1 IMPLANT
PACK CARDIAC CATHETERIZATION (CUSTOM PROCEDURE TRAY) ×2 IMPLANT
SHEATH GLIDE SLENDER 4/5FR (SHEATH) ×1 IMPLANT
SHEATH PINNACLE 5F 10CM (SHEATH) ×1 IMPLANT
TRANSDUCER W/STOPCOCK (MISCELLANEOUS) ×2 IMPLANT
TUBING CIL FLEX 10 FLL-RA (TUBING) ×1 IMPLANT
WIRE EMERALD 3MM-J .035X150CM (WIRE) ×1 IMPLANT

## 2018-05-26 NOTE — Care Management Note (Signed)
3F sheath removed by Burlene Arnt RTR.  Sheath removed from right femoral artery intact.  30 min manual pressure applied Site level 0.  Dressed with 4x4 and Tegaderm dressing applied.  DP +2.  Post instructions given.  Bed rest begins at 13:00 hrs.

## 2018-05-26 NOTE — H&P (Signed)
[place] 05/11/2018 2:00 PM Location: [facility] Patient #: [id number] DOB: [birth year] Married / Language: Undefined / Race: Black or African American Male   History of Present Illness Tyler Mormon MD; 05/12/2018 8:05 AM) Patient words: F/U after testing, Last OV 04/08/18.  The patient is a 73 year old male, accompanied by his wife during the visit, presenting to discuss test results. 73 year old African-American male with uncontrolled hypertension, type II diabetes mellitus, former smoker, seen for exertional dyspnea, atypical chest pain, abnormal EKG.  Workup for the following. Echocardiogram with mild LVH, with findings suspicious for LV non-compaction. EF 40-45 percent. 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 Lexiscan nuclear stress test with perfusion defects at rest and stress and apical to basal anteroseptal myocardium. LVEF reduced at 37%.  Patient is here to discuss results. He has not done as much physical activity recently, owing to his symptoms that occured over the summer and fall. With limited physical activity, he does not have many symptoms. He has strong family h/o CAD, but denies any known family history of cardiomyopathy.   Problem List/Past Medical Juliann Pulse [place]; 05/11/2018 1:58 PM) Prostate Cancer [2002]: Laboratory examination (Z01.89)  Labs 03/11/2018: Glucose 126. BUN/creatinine 20/1.4. EGFR 52. Sodium 142, potassium 4.2. Rest of the CMP normal. H/H 14/45. MCV 90. Platelets 187. Hemoglobin A1c 5.7% Cholesterol 152, triglycerides 55, HDL 57, LDL 84. Abnormal EKG (R94.31)  EKG 04/08/2018: Sinus rhythm 70 bpm. Normal conduction. 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 Myocardial injury cannot be excluded. EKG changes new compared to previous EKG on 05/24/2014 Exertional dyspnea (R06.09)  Lexiscan myoview stress test 05/01/2018: 1. Lexiscan stress with low level exercise was performed. Exercise capacity was not  assessed. No stress symptoms reported. Blood pressure was normal. The resting electrocardiogram demonstrated normal sinus rhythm, normal resting conduction and no resting arrhythmias. Anteroseptal T wave inversions seen. Stress EKG is non diagnostic for ischemia as it is a pharmacologic stress. However, heart rate increased to 87% of maximum predicted heart rate on level exercise. Stress electrocardiogram showed The stress electrocardiogram showed sinus tachycardia with ventricular bigeminy and persistent anteroseptal T wave inversion. This progressed to LBBB, that persisted several minutes into recovery. 2. The overall quality of the study is good. Left ventricular cavity is noted to be enlarged on the rest and stress studies. Gated SPECT imaging demonstrates anteroseptal dyskinesis along with global diffuse hypokinesis. LVEF calculated at 37%. SPECT images show medium sized, mild intensity, perfusion defect in apical to basal anteroseptal, more prominent on rest images. While these perfusion defects could be related to differential septal perfusion, ischemia cannot be excluded. 3. High risk study. Clinical correlation recommended due to possibility of differential septal perfusion during exercise induced left bundle branch block. Screening for AAA (abdominal aortic aneurysm) (Z13.6)  Abdominal aortic duplex 05/04/2018: Severe calcific plaque throughout the abdominal aorta. Normal flow velocities noted in aorta and iliac vessels. 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 Recheck in 2 years. Controlled type 2 diabetes mellitus (E11.9)  Hypertension, essential (I10)  Echocardiogram 05/04/2018: Left ventricle cavity is small. Mild concentric hypertrophy of the left ventricle. 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 There is mild hypokinesis of the the anterior, anterolateral walls. Mild decrease in LV systolic function. Doppler  evidence of grade I (impaired) diastolic dysfunction, normal LAP. Visual EF is 40-45%. Trace mitral regurgitation. Trace tricuspid regurgitation.  Allergies Juliann Pulse [place]; 05/11/2018 1:58 PM) No Known  Drug Allergies [04/08/2018]:  Family History Laren Boom; 2018/05/24 1:58 PM) Mother  Deceased. Passed away at age 73, History of MI, and congestive heart failure Father  Deceased. Passed away at age 62, no known heart conditions Sister 5  In stable health. All are in stable health Older,73, sister has history of strokes Older,72, history of diabe Younger, 24, history of blockage, had stent placed Younger, 71, Diabetic Younger, 100, history of fibromyalgia Brother 1  In poor health. History of drug abuse, no known heart conditions  Social History Laren Boom; 05/24/18 1:58 PM) Current tobacco use  Former smoker. Pt quit in 1992 30 PY history Alcohol Use  Occasional alcohol use. Marital status  Married. Living Situation  Lives with spouse. Number of Children  3.  Past Surgical History Laren Boom; 24-May-2018 1:58 PM) Prostate Surgery - Removal [2002]: Back Surgery [2017]:  Medication History Laren Boom; 05-24-2018 2:05 PM) Montelukast Sodium (10MG Tablet, 1 Oral in the evening) Active. Triamterene-HCTZ (37.5-25MG Capsule, 1 Oral daily) Active. Synjardy (5-1000MG Tablet, 1 Oral two times daily) Active. Vitamin D3 (2000UNIT Capsule, 1 Oral daily) Active. Atorvastatin Calcium (10MG Tablet, 1 Oral daily) Active. Flonase (50MCG/ACT Suspension, 2 sprays each nostril Nasal as needed) Active. Aspirin 81 (81MG Tablet DR, 1 Oral daily) Active. Melatonin (3MG Capsule, 1 Oral at bedtime) Active. clonazePAM (0.5MG Tablet, 1 Oral as needed) Active. Medications Reconciled (Verbally with Patient, List present)  Diagnostic Studies History Laren Boom; 05/24/2018 2:01 PM) Abdominal Aortic Duplex  05/04/2018: Severe calcific plaque throughout the abdominal aorta.  Normal flow velocities noted in aorta and iliac vessels. 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 Recheck in 2 years. Nuclear stress test  1. Lexiscan stress with low level exercise was performed. Exercise capacity was not assessed. No stress symptoms reported. Blood pressure was normal. The resting electrocardiogram demonstrated normal sinus rhythm, normal resting conduction and no resting arrhythmias. Anteroseptal T wave inversions seen. Stress EKG is non diagnostic for ischemia as it is a pharmacologic stress. However, heart rate increased to 87% of maximum predicted heart rate on level exercise. Stress electrocardiogram showed The stress electrocardiogram showed sinus tachycardia with ventricular bigeminy and persistent anteroseptal T wave inversion. This progressed to LBBB, that persisted several minutes into recovery. 2. The overall quality of the study is good. Left ventricular cavity is noted to be enlarged on the rest and stress studies. Gated SPECT imaging demonstrates anteroseptal dyskinesis along with global diffuse hypokinesis. LVEF calculated at 37%. SPECT images show medium sized, mild intensity, perfusion defect in apical to basal anteroseptal, more prominent on rest images. While these perfusion defects could be related to differential septal perfusion, ischemia cannot be excluded. 3. High risk study. Clinical correlation recommended due to possibility of differential septal perfusion during exercise induced left bundle branch block. Echocardiogram  05/04/2018: Left ventricle cavity is small. Mild concentric hypertrophy of the left ventricle. 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 There is mild hypokinesis of the the anterior, anterolateral walls. Mild decrease in LV systolic function. Doppler evidence of grade I (impaired) diastolic dysfunction, normal LAP. Visual EF is 40-45%. Trace mitral  regurgitation. Trace tricuspid regurgitation.    Review of Systems Tyler Mormon, MD; 05/12/2018 8:1 AM) General Not Present- Appetite Loss and Weight Gain. Respiratory Not Present- Chronic Cough and Wakes up from Sleep Wheezing or Short of Breath. Cardiovascular Present- Chest Pain and Difficulty Breathing On Exertion. Not Present- Claudications, Difficulty Breathing Lying Down,  Edema, Fainting, Hypertension and Palpitations. Gastrointestinal Not Present- Black, Tarry Stool and Difficulty Swallowing. Musculoskeletal Not Present- Decreased Range of Motion and Muscle Atrophy. Neurological Not Present- Attention Deficit. Psychiatric Not Present- Personality Changes and Suicidal Ideation. Endocrine Not Present- Cold Intolerance and Heat Intolerance. Hematology Not Present- Abnormal Bleeding. All other systems negative  Vitals Juliann Pulse [place]; 05/11/2018 2:07 PM) 05/11/2018 2:01 PM Weight: 188.13 lb Height: 70.5in Body Surface Area: 2.04 m Body Mass Index: 26.61 kg/m  Pulse: 88 (Regular)  P.OX: 96% (Room air) BP: 121/75 (Sitting, Left Arm, Standard)       Physical Exam Tyler Mormon, MD; 05/12/2018 8:1 AM) General Mental Status-Alert. General Appearance-Cooperative and Appears stated age. Build & Nutrition-Moderately built.  Head and Neck Thyroid Gland Characteristics - normal size and consistency and no palpable nodules.  Chest and Lung Exam Chest and lung exam reveals -quiet, even and easy respiratory effort with no use of accessory muscles, non-tender and on auscultation, normal breath sounds, no adventitious sounds.  Cardiovascular Cardiovascular examination reveals -normal heart sounds, regular rate and rhythm with no murmurs, carotid auscultation reveals no bruits, abdominal aorta auscultation reveals no bruits and no prominent pulsation and femoral artery auscultation bilaterally reveals normal pulses, no bruits, no  thrills.  Abdomen Palpation/Percussion Normal exam - Non Tender and No hepatosplenomegaly.  Peripheral Vascular Lower Extremity Palpation - Edema - Bilateral - No edema. Dorsalis pedis pulse - Bilateral - Normal. Posterior tibial pulse - Bilateral - Normal. Carotid arteries - Bilateral-No Carotid bruit.  Neurologic Neurologic evaluation reveals -alert and oriented x 3 with no impairment of recent or remote memory. Motor-Grossly intact without any focal deficits.  Musculoskeletal Global Assessment Left Lower Extremity - no deformities, masses or tenderness, no known fractures. Right Lower Extremity - no deformities, masses or tenderness, no known fractures.   Results Tyler Mormon MD; 05/12/2018 8:01 AM) Procedures  Name Value Date Duplex scan of aorta, inferior vena cava, iliac vasculature, or bypass grafts; complete study (24235) Comments: Abdominal aortic duplex 05/04/2018: Severe calcific plaque throughout the abdominal aorta. Normal flow velocities noted in aorta and iliac vessels. 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 Recheck in 2 years.  Performed: 05/04/2018 11:48 AM Echocardiography, transthoracic, real-time with image documentation (2D), includes M-mode recording, when performed, complete, with spectral Doppler echocardiography, and with color flow Doppler echocardiography ([id number]) Comments: Echocardiogram 05/04/2018: Left ventricle cavity is small. Mild concentric hypertrophy of the left ventricle. 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 There is mild hypokinesis of the the anterior, anterolateral walls. Mild decrease in LV systolic function. Doppler evidence of grade I (impaired) diastolic dysfunction, normal LAP. Visual EF is 40-45%. Trace mitral regurgitation. Trace tricuspid regurgitation.  Performed: 05/04/2018 11:48 AM Myocardial perfusion imaging, tomographic  (SPECT) (including attenuation correction, qualitative or quantitative wall motion, ejection fraction by first pass or gated technique, additional quantification, when performed); multiple studies, Comments: 1. Lexiscan stress with low level exercise was performed. Exercise capacity was not assessed. No stress symptoms reported. Blood pressure was normal. The resting electrocardiogram demonstrated normal sinus rhythm, normal resting conduction and no resting arrhythmias. Anteroseptal T wave inversions seen. Stress EKG is non diagnostic for ischemia as it is a pharmacologic stress. However, heart rate increased to 87% of maximum predicted heart rate on level exercise. Stress electrocardiogram showed The stress electrocardiogram showed sinus tachycardia with ventricular bigeminy and persistent anteroseptal T wave inversion. This progressed to LBBB, that persisted several minutes into  recovery. 2. The overall quality of the study is good. Left ventricular cavity is noted to be enlarged on the rest and stress studies. Gated SPECT imaging demonstrates anteroseptal dyskinesis along with global diffuse hypokinesis. LVEF calculated at 37%. SPECT images show medium sized, mild intensity, perfusion defect in apical to basal anteroseptal, more prominent on rest images. While these perfusion defects could be related to differential septal perfusion, ischemia cannot be excluded. 3. High risk study. Clinical correlation recommended due to possibility of differential septal perfusion during exercise induced left bundle branch block.  Performed: 05/01/2018 11:49 AM    Assessment & Plan Joya Gaskins Esther Hardy MD; 05/12/2018 8:01 AM) Exertional dyspnea (R06.09) Story: Lexiscan myoview stress test 05/01/2018: 1. Lexiscan stress with low level exercise was performed. Exercise capacity was not assessed. No stress symptoms reported. Blood pressure was normal. The resting electrocardiogram demonstrated normal sinus rhythm,  normal resting conduction and no resting arrhythmias. Anteroseptal T wave inversions seen. Stress EKG is non diagnostic for ischemia as it is a pharmacologic stress. However, heart rate increased to 87% of maximum predicted heart rate on level exercise. Stress electrocardiogram showed The stress electrocardiogram showed sinus tachycardia with ventricular bigeminy and persistent anteroseptal T wave inversion. This progressed to LBBB, that persisted several minutes into recovery. 2. The overall quality of the study is good. Left ventricular cavity is noted to be enlarged on the rest and stress studies. Gated SPECT imaging demonstrates anteroseptal dyskinesis along with global diffuse hypokinesis. LVEF calculated at 37%. SPECT images show medium sized, mild intensity, perfusion defect in apical to basal anteroseptal, more prominent on rest images. While these perfusion defects could be related to differential septal perfusion, ischemia cannot be excluded. 3. High risk study. Clinical correlation recommended due to possibility of differential septal perfusion during exercise induced left bundle branch block. Current Plans METABOLIC PANEL, BASIC (94854) CBC & PLATELETS (AUTO) (62703) Abnormal EKG (R94.31) Story: EKG 04/08/2018: Sinus rhythm 70 bpm. Normal conduction. 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 Myocardial injury cannot be excluded. EKG changes new compared to previous EKG on 05/24/2014 Screening for AAA (abdominal aortic aneurysm) (Z13.6) Story: Abdominal aortic duplex 05/04/2018: Severe calcific plaque throughout the abdominal aorta. Normal flow velocities noted in aorta and iliac vessels. 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 Recheck in 2 years. Controlled type 2 diabetes mellitus (E11.9) Hypertension, essential (I10) Story: Echocardiogram 05/04/2018: Left ventricle cavity is small. Mild concentric hypertrophy of the  left ventricle. 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 There is mild hypokinesis of the the anterior, anterolateral walls. Mild decrease in LV systolic function. Doppler evidence of grade I (impaired) diastolic dysfunction, normal LAP. Visual EF is 40-45%. Trace mitral regurgitation. Trace tricuspid regurgitation. Abnormal stress test (R94.39) Laboratory examination (Z01.89) Story: Labs 03/11/2018: Glucose 126. BUN/creatinine 20/1.4. EGFR 52. Sodium 142, potassium 4.2. Rest of the CMP normal. H/H 14/45. MCV 90. Platelets 187. Hemoglobin A1c 5.7% Cholesterol 152, triglycerides 55, HDL 57, LDL 84.  Note:This patient for LV non-compaction, as well as abnormal stress test. Cath and MRI. Follow-up after that. No changes today.  Assessment/ Recommendations:  73 year old African-American male with uncontrolled hypertension, type II diabetes mellitus, former smoker, now with exertional dyspnea, atypical chest pain, abnormal EKG. 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 Recommend cardiac MRI for further evaluation. If confirmed, he will need genetic counseling. Also, given stress test abnormalities  as above, recommend left heart cathateriation with coronayr angiography to exclude CAD. No change made to medical management today. Depending on further information, he may need guideline directed medical therapy for heart failure.  AAA screening: Small distal aorta aneurysm. Repeat ultrasound in 2 years.  Hypertension: Controlled. Continue current therapy.  Type 2 DM: Continue current therapy.  I will see him after the tests  Cc Deland Pretty, MD  Signed electronically by Tyler Mormon, MD (05/12/2018 8:06 AM)

## 2018-05-26 NOTE — Interval H&P Note (Signed)
History and Physical Interval Note:  05/26/2018 11:29 AM  Tyler Hayes  has presented today for surgery, with the diagnosis of SOB, Cardiomyopathy  The various methods of treatment have been discussed with the patient and family. After consideration of risks, benefits and other options for treatment, the patient has consented to  Procedure(s): LEFT HEART CATH AND CORONARY ANGIOGRAPHY (N/A) as a surgical intervention .  The patient's history has been reviewed, patient examined, no change in status, stable for surgery.  I have reviewed the patient's chart and labs.  Questions were answered to the patient's satisfaction.    2012 Appropriate Use Criteria for Diagnostic Catheterization Home / Select Test of Interest Desired Test and Scenario Suspected CAD Prior Noninvasive Testing Pharmacological Stress Test CAD Assessment (Coronary Angiography With or Without Left Heart Catheterization and/or Left Ventricu CAD Assessment (Coronary Angiography With or Without Left Heart Catheterization and/or Left Ventriculography) Link Here: [email or web address] Indication:  1. Suspected CAD (No Prior PCI, No Prior CABG, and No Prior Angiogram Showing > = 50% Angiographic Stenosis) 2. Prior Noninvasive Testing: Stress Test With Imaging (SPECT MPI, Stress Echocardiography, Stress PET, Stress CMR) 3. High-risk findings (e.g., 10% ischemic myocardium on stress SPECT MPI or stress PET, stress-induced wall motion abnormality in 2 or more segments on stress echo or stress CMR) 4. Pretest Symptom Status: Symptomatic A (9) Indication: 17; Score 9     [place]

## 2018-05-26 NOTE — Discharge Instructions (Signed)
NO SYNJARDY FOR 2 DAYS    Radial Site Care  This sheet gives you information about how to care for yourself after your procedure. Your health care provider may also give you more specific instructions. If you have problems or questions, contact your health care provider. What can I expect after the procedure? After the procedure, it is common to have:  Bruising and tenderness at the catheter insertion area. Follow these instructions at home: Medicines  Take over-the-counter and prescription medicines only as told by your health care provider. Insertion site care  Follow instructions from your health care provider about how to take care of your insertion site. Make sure you: ? Wash your hands with soap and water before you change your bandage (dressing). If soap and water are not available, use hand sanitizer. ? Change your dressing as told by your health care provider. ? Leave stitches (sutures), skin glue, or adhesive strips in place. These skin closures may need to stay in place for 2 weeks or longer. If adhesive strip edges start to loosen and curl up, you may trim the loose edges. Do not remove adhesive strips completely unless your health care provider tells you to do that.  Check your insertion site every day for signs of infection. Check for: ? Redness, swelling, or pain. ? Fluid or blood. ? Pus or a bad smell. ? Warmth.  Do not take baths, swim, or use a hot tub until your health care provider approves.  You may shower 24-48 hours after the procedure, or as directed by your health care provider. ? Remove the dressing and gently wash the site with plain soap and water. ? Pat the area dry with a clean towel. ? Do not rub the site. That could cause bleeding.  Do not apply powder or lotion to the site. Activity   For 24 hours after the procedure, or as directed by your health care provider: ? Do not flex or bend the affected arm. ? Do not push or pull heavy objects with  the affected arm. ? Do not drive yourself home from the hospital or clinic. You may drive 24 hours after the procedure unless your health care provider tells you not to. ? Do not operate machinery or power tools.  Do not lift anything that is heavier than 10 lb (4.5 kg), or the limit that you are told, until your health care provider says that it is safe.  Ask your health care provider when it is okay to: ? Return to work or school. ? Resume usual physical activities or sports. ? Resume sexual activity. General instructions  If the catheter site starts to bleed, raise your arm and put firm pressure on the site. If the bleeding does not stop, get help right away. This is a medical emergency.  If you went home on the same day as your procedure, a responsible adult should be with you for the first 24 hours after you arrive home.  Keep all follow-up visits as told by your health care provider. This is important. Contact a health care provider if:  You have a fever.  You have redness, swelling, or yellow drainage around your insertion site. Get help right away if:  You have unusual pain at the radial site.  The catheter insertion area swells very fast.  The insertion area is bleeding, and the bleeding does not stop when you hold steady pressure on the area.  Your arm or hand becomes pale, cool, tingly, or  numb. These symptoms may represent a serious problem that is an emergency. Do not wait to see if the symptoms will go away. Get medical help right away. Call your local emergency services (911 in the [place] Do not drive yourself to the hospital. Summary  After the procedure, it is common to have bruising and tenderness at the site.  Follow instructions from your health care provider about how to take care of your radial site wound. Check the wound every day for signs of infection.  Do not lift anything that is heavier than 10 lb (4.5 kg), or the limit that you are told, until your  health care provider says that it is safe. This information is not intended to replace advice given to you by your health care provider. Make sure you discuss any questions you have with your health care provider. Document Released: 06/08/2010 Document Revised: 06/11/2017 Document Reviewed: 06/11/2017 Elsevier Interactive Patient Education  2019 [place].    Femoral Site Care This sheet gives you information about how to care for yourself after your procedure. Your health care provider may also give you more specific instructions. If you have problems or questions, contact your health care provider. What can I expect after the procedure? After the procedure, it is common to have:  Bruising that usually fades within 1-2 weeks.  Tenderness at the site. Follow these instructions at home: Wound care  Follow instructions from your health care provider about how to take care of your insertion site. Make sure you: ? Wash your hands with soap and water before you change your bandage (dressing). If soap and water are not available, use hand sanitizer. ? Change your dressing as told by your health care provider. ? Leave stitches (sutures), skin glue, or adhesive strips in place. These skin closures may need to stay in place for 2 weeks or longer. If adhesive strip edges start to loosen and curl up, you may trim the loose edges. Do not remove adhesive strips completely unless your health care provider tells you to do that.  Do not take baths, swim, or use a hot tub until your health care provider approves.  You may shower 24-48 hours after the procedure or as told by your health care provider. ? Gently wash the site with plain soap and water. ? Pat the area dry with a clean towel. ? Do not rub the site. This may cause bleeding.  Do not apply powder or lotion to the site. Keep the site clean and dry.  Check your femoral site every day for signs of infection. Check for: ? Redness, swelling,  or pain. ? Fluid or blood. ? Warmth. ? Pus or a bad smell. Activity  For the first 2-3 days after your procedure, or as long as directed: ? Avoid climbing stairs as much as possible. ? Do not squat.  Do not lift anything that is heavier than 10 lb (4.5 kg), or the limit that you are told, until your health care provider says that it is safe.  Rest as directed. ? Avoid sitting for a long time without moving. Get up to take short walks every 1-2 hours.  Do not drive for 24 hours if you were given a medicine to help you relax (sedative). General instructions  Take over-the-counter and prescription medicines only as told by your health care provider.  Keep all follow-up visits as told by your health care provider. This is important. Contact a health care provider if you have:  A fever or chills.  You have redness, swelling, or pain around your insertion site. Get help right away if:  The catheter insertion area swells very fast.  You pass out.  You suddenly start to sweat or your skin gets clammy.  The catheter insertion area is bleeding, and the bleeding does not stop when you hold steady pressure on the area.  The area near or just beyond the catheter insertion site becomes pale, cool, tingly, or numb. These symptoms may represent a serious problem that is an emergency. Do not wait to see if the symptoms will go away. Get medical help right away. Call your local emergency services (911 in the [place] Do not drive yourself to the hospital. Summary  After the procedure, it is common to have bruising that usually fades within 1-2 weeks.  Check your femoral site every day for signs of infection.  Do not lift anything that is heavier than 10 lb (4.5 kg), or the limit that you are told, until your health care provider says that it is safe. This information is not intended to replace advice given to you by your health care provider. Make sure you discuss any questions you have with  your health care provider. Document Released: 01/07/2014 Document Revised: 05/19/2017 Document Reviewed: 05/19/2017 Elsevier Interactive Patient Education  2019 [place].    Moderate Conscious Sedation, Adult, Care After These instructions provide you with information about caring for yourself after your procedure. Your health care provider may also give you more specific instructions. Your treatment has been planned according to current medical practices, but problems sometimes occur. Call your health care provider if you have any problems or questions after your procedure. What can I expect after the procedure? After your procedure, it is common:  To feel sleepy for several hours.  To feel clumsy and have poor balance for several hours.  To have poor judgment for several hours.  To vomit if you eat too soon. Follow these instructions at home: For at least 24 hours after the procedure:   Do not: ? Participate in activities where you could fall or become injured. ? Drive. ? Use heavy machinery. ? Drink alcohol. ? Take sleeping pills or medicines that cause drowsiness. ? Make important decisions or sign legal documents. ? Take care of children on your own.  Rest. Eating and drinking  Follow the diet recommended by your health care provider.  If you vomit: ? Drink water, juice, or soup when you can drink without vomiting. ? Make sure you have little or no nausea before eating solid foods. General instructions  Have a responsible adult stay with you until you are awake and alert.  Take over-the-counter and prescription medicines only as told by your health care provider.  If you smoke, do not smoke without supervision.  Keep all follow-up visits as told by your health care provider. This is important. Contact a health care provider if:  You keep feeling nauseous or you keep vomiting.  You feel light-headed.  You develop a rash.  You have a fever. Get help  right away if:  You have trouble breathing. This information is not intended to replace advice given to you by your health care provider. Make sure you discuss any questions you have with your health care provider. Document Released: 02/24/2013 Document Revised: 10/09/2015 Document Reviewed: 08/26/2015 Elsevier Interactive Patient Education  2019 [employer].

## 2018-05-27 ENCOUNTER — Encounter (HOSPITAL_COMMUNITY): Payer: Self-pay | Admitting: Cardiology

## 2018-05-27 MED FILL — Heparin Sod (Porcine)-NaCl IV Soln 1000 Unit/500ML-0.9%: INTRAVENOUS | Qty: 1000 | Status: AC

## 2018-06-02 DIAGNOSIS — M1990 Unspecified osteoarthritis, unspecified site: Secondary | ICD-10-CM | POA: Diagnosis not present

## 2018-06-02 DIAGNOSIS — R768 Other specified abnormal immunological findings in serum: Secondary | ICD-10-CM | POA: Diagnosis not present

## 2018-06-02 DIAGNOSIS — M79643 Pain in unspecified hand: Secondary | ICD-10-CM | POA: Diagnosis not present

## 2018-06-04 DIAGNOSIS — R9439 Abnormal result of other cardiovascular function study: Secondary | ICD-10-CM | POA: Diagnosis not present

## 2018-06-04 DIAGNOSIS — I10 Essential (primary) hypertension: Secondary | ICD-10-CM | POA: Diagnosis not present

## 2018-06-04 DIAGNOSIS — E119 Type 2 diabetes mellitus without complications: Secondary | ICD-10-CM | POA: Diagnosis not present

## 2018-06-04 DIAGNOSIS — R9431 Abnormal electrocardiogram [ECG] [EKG]: Secondary | ICD-10-CM | POA: Diagnosis not present

## 2018-06-09 ENCOUNTER — Encounter: Payer: Self-pay | Admitting: *Deleted

## 2018-06-09 ENCOUNTER — Telehealth: Payer: Self-pay | Admitting: *Deleted

## 2018-06-09 NOTE — Telephone Encounter (Signed)
Left message regarding appointment for Cardiac MRI ordered by Dr. Benjamine Mola for 06/18/18 --will mail information to patient

## 2018-06-11 ENCOUNTER — Encounter: Payer: Self-pay | Admitting: *Deleted

## 2018-06-16 ENCOUNTER — Telehealth (HOSPITAL_COMMUNITY): Payer: Self-pay | Admitting: Emergency Medicine

## 2018-06-16 NOTE — Telephone Encounter (Signed)
Left message on voicemail with name and callback number Pearson Reasons RN Navigator Cardiac Imaging [facility] [phone] Office [phone] Cell  

## 2018-06-17 ENCOUNTER — Telehealth (HOSPITAL_COMMUNITY): Payer: Self-pay | Admitting: Emergency Medicine

## 2018-06-17 NOTE — Telephone Encounter (Signed)
Returning patients call-- pt verbalizes understanding of appt date/time, parking situation and where to check in, and verified current allergies; name and call back number provided for further questions should they arise Tyler Bond RN Navigator Cardiac [facility] [phone] office [phone] cell

## 2018-06-18 ENCOUNTER — Ambulatory Visit (HOSPITAL_COMMUNITY)
Admission: RE | Admit: 2018-06-18 | Discharge: 2018-06-18 | Disposition: A | Discharge status: Home / Self Care | Payer: Medicare Other | Source: Ambulatory Visit | Attending: Cardiology | Admitting: Cardiology

## 2018-06-18 DIAGNOSIS — R0602 Shortness of breath: Secondary | ICD-10-CM | POA: Insufficient documentation

## 2018-06-18 DIAGNOSIS — I429 Cardiomyopathy, unspecified: Secondary | ICD-10-CM

## 2018-06-18 MED ORDER — GADOBUTROL 1 MMOL/ML IV SOLN
9.0000 mL | Freq: Once | INTRAVENOUS | Status: AC | PRN
  Administered 2018-06-18: 9 mL via INTRAVENOUS

## 2018-06-30 DIAGNOSIS — M1990 Unspecified osteoarthritis, unspecified site: Secondary | ICD-10-CM | POA: Diagnosis not present

## 2018-06-30 DIAGNOSIS — Z79899 Other long term (current) drug therapy: Secondary | ICD-10-CM | POA: Diagnosis not present

## 2018-07-06 ENCOUNTER — Ambulatory Visit: Payer: Medicare Other | Admitting: Neurology

## 2018-07-21 ENCOUNTER — Ambulatory Visit (INDEPENDENT_AMBULATORY_CARE_PROVIDER_SITE_OTHER): Payer: Medicare Other | Admitting: Neurology

## 2018-07-21 ENCOUNTER — Other Ambulatory Visit: Payer: Self-pay

## 2018-07-21 ENCOUNTER — Encounter: Payer: Self-pay | Admitting: Neurology

## 2018-07-21 VITALS — BP 117/69 | HR 88 | Resp 18 | Ht 70.5 in | Wt 187.5 lb

## 2018-07-21 DIAGNOSIS — R251 Tremor, unspecified: Secondary | ICD-10-CM

## 2018-07-21 DIAGNOSIS — G4752 REM sleep behavior disorder: Secondary | ICD-10-CM | POA: Diagnosis not present

## 2018-07-21 MED ORDER — CLONAZEPAM 0.5 MG PO TABS: ORAL_TABLET | ORAL | 5 refills | Status: DC

## 2018-07-21 NOTE — Progress Notes (Signed)
SLEEP MEDICINE CLINIC   Provider:  Larey Seat, MD  Referring Provider: Deland Pretty, MD Primary Care Physician:  Deland Pretty, MD  Chief Complaint  Patient presents with  . REM Sleep Disorder    Rm. 11.  Sts. continues to act out during sleep almost every night. Takes Klonopin only prn sleep. Is afraid of possiblilty of addiction with nightly use./fim  . Tremors  . Memory Loss    HPI:  Tyler Hayes is a 73 y.o. male , seen here as a revisit  from Dr. Shelia Media for REM Behavior [place] 07-21-2018;     Chief complaint according to patient : [name] stated " swinging, kicking and jumping in his sleep", also he mentioned that he developed a " right hand tremor". [name] reports that he acted out dreams sometimes with kicking more swaying even in his 74s. He is now in his second marriage but he remembers that his first wife was complaining about his abrupt movements and sometimes kicking her or bruising her. She died of leukemia in 08-30-00 years ago. He also reports that he used to be physically so much more active before he retired that he felt he got deeper and more restorative sleep simply because he was physically in need of sleep. He believes that acting out dreams has increased since his retirement, which began in 08-31-2011. He used to be a Holiday representative.  Sleep habits are as follows: He likes to retreat to the bedroom around 10 PM, relaxes there , reads and plays solitaire. Around 11:30 PM is when he usually initiate sleep. He has discovered that he doesn't sleep well if he watches TV close to bedtime and therefore eliminated this. He shares a bedroom with his wife, the bedroom is cool ,quiet and dark, he sleeps on multiple pillows.He also reports that he most nights he sleeps well through the night but sometimes he has very vivid dreams and he tends to sleep on the right side, and his acting out of dreams is also correlated to sleeping on the right side. He avoids the  supine sleep position as it causes his choking on sinus drainage. Acting out his dreams seems to start later than an hour into sleep and may last well into the morning hours usually he has only one episode of dream enactment and not multiple. Usually his dreams include being threatened, followed, protecting himself or his wife. Sometimes he feels that sometimes he tries to bite his legs fand he will flex and inadvertently kicked her. His wife will try to wake him up. He has left the bed . He is trembling and she will call out and wake him. If he goes back to sleep right away sometimes the dream will continue with the same incident.Sleep related medical history/ Family history:   Father had a tremor, died of a brain tumor while patient was in high school.   Social history:  Retired  Re -married, adult 2 sons and one step son , adult children, he quit smoking in 1990-08-31 , after  58 yeas, and 20 pack years. ETOH;  Seldom,  caffeine :2-3 cups in AM, I one glass of iced tea in PM,and 3 cokes a day.    Interval history from 06/26/2016, I have pleasure of seeing [name] today following a sleep study from 04/21/2016 the patient had no significant apnea, his AHI was 2.3 his REM AHI was 5.7 which is still considered low. He did not have any  measurable apnea if he didn't sleep on his back. 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  [name] was last seen on 23 December 2016 by our nurse practitioner Vaughan Browner, and he is here today for a 6 months interval revisit on 25 June 2017.  [name] reports that he still takes daytime naps but his residual sleepiness during the day has been manageable.  He endorsed the Epworth Sleepiness Scale at 11 points which is just a little above average.  Fatigue was only endorsed at 16 points.   In his sleep study but took place exactly a year ago there was no significant sleep apnea noted.  He did have REM sleep behavior during the study- and his wife is reporting still active sleep behavior. He feels is less severe, but it is still a treat to his wife.  She has noticed that the dream behavior can occur several times in the same night, every night of the week. He sits up, screams and kicks. He seems to fight.  Lasts 2 minutes or less. Last night he was crying, and moving. He has fallen out of bed. He uses a bed rail, and still fell out. Melatonin has helped but not eliminated the REM BD.   Tyler Hayes is a 73 y.o. male , seen here as a revisit from Dr. 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 His wife reports sleep talking every night, but not as much kicking, thrashing. He has last month fallen-  Out of the bed and hit his head.  His wife thinks he takes  Klonopin less than 2 a month. He otherwise feels he controls REM BD with melatonin. We also added a MMSE today, and he scored 29/ 30 points! Very good.   Review of Systems: Out of a complete 14 system review, the patient complains of only the following symptoms, and all other reviewed systems are negative.  Good memory, better control in REM BD, yelling, kicking thrashing.  REM BD. Some kicking, thrashing, yelling.  Less EDS with 7 points in Epworth-  but with naps in daytime.  FSS at 10 from 16 Points.  Tv watching , but not in bedroom- a likes action TV and seems to enact these shows, dreams about them.   Social History   Socioeconomic History  . Marital status: Married    Spouse name: Not on file  . Number of children: Not on file  . Years of education: Not on file  . Highest education level: Not on file  Occupational History  . Not on file  Social Needs  . Financial resource strain: Not on file   . Food insecurity:    Worry: Not on file    Inability: Not on file  . Transportation needs:    Medical: Not on file    Non-medical: Not on file  Tobacco Use  . Smoking status: Former Smoker    Packs/day: 1.50    Years: 20.00    Pack years: 30.00    Types: Cigarettes    Last attempt to quit: 02/01/1991    Years since quitting: 27.4  . Smokeless tobacco: Never Used  Substance and Sexual Activity  . Alcohol use: Yes  Comment: occasionally  . Drug use: No  . Sexual activity: Not on file  Lifestyle  . Physical activity:    Days per week: Not on file    Minutes per session: Not on file  . Stress: Not on file  Relationships  . Social connections:    Talks on phone: Not on file    Gets together: Not on file    Attends religious service: Not on file    Active member of club or organization: Not on file    Attends meetings of clubs or organizations: Not on file    Relationship status: Not on file  . Intimate partner violence:    Fear of current or ex partner: Not on file    Emotionally abused: Not on file    Physically abused: Not on file    Forced sexual activity: Not on file  Other Topics Concern  . Not on file  Social History Narrative  . Not on file    No family history on file.  Past Medical History:  Diagnosis Date  . Arthritis    back  . Cancer [facility])    prostate  . Diabetes mellitus without complication ([place])    on meds  . Diverticulosis   . Hypercholesteremia   . Hypertension   . Meatal stenosis   . Osteoarthritis   . Sinus drainage   . Tinea pedis     Past Surgical History:  Procedure Laterality Date  . BACK SURGERY  1984, 2013   lumbar  . DG DILATION URETERS  2003  . EYE SURGERY Bilateral 1999   Lasik  . LEFT HEART CATH AND CORONARY ANGIOGRAPHY N/A 05/26/2018   Procedure: LEFT HEART CATH AND CORONARY ANGIOGRAPHY;  Surgeon: Nigel Mormon, MD;  Location: [facility];  Service: Cardiovascular;  Laterality: N/A;  . POSTERIOR LUMBAR  FUSION 4 LEVEL N/A 06/01/2014   Procedure: LUMBAR THREE TO FOUR, LUMBAR FOUR TO FIVE LAMINECTOMY,  RIGHT LUMBAR FUSION AT LUMBAR FOUR TO FIVE;  Surgeon: Floyce Stakes, MD;  Location: [facility];  Service: Neurosurgery;  Laterality: N/A;  POSSIBLE L2-3 L3-4 L4-5 L5-S1 POSTERIOR LUMBAR INTERBODY FUSION  . PROSTATECTOMY  2002    Current Outpatient Medications  Medication Sig Dispense Refill  . aspirin EC 81 MG tablet Take 81 mg by mouth daily.    Marland Kitchen atorvastatin (LIPITOR) 10 MG tablet Take 10 mg by mouth daily.    . Cholecalciferol (GNP VITAMIN D MAXIMUM STRENGTH) 50 MCG (2000 UT) TABS Take 2,000 Units by mouth daily.    . clonazePAM (KLONOPIN) 0.5 MG tablet At bedtime prn. 30 tablet 1  . fluticasone (FLONASE) 50 MCG/ACT nasal spray Place 1 spray into both nostrils daily as needed for rhinitis (drainage issues.).     Marland Kitchen montelukast (SINGULAIR) 10 MG tablet Take 10 mg by mouth every evening.    Marland Kitchen SYNJARDY XR 09-998 MG TB24 Take 1 tablet by mouth daily.     Marland Kitchen triamterene-hydrochlorothiazide (MAXZIDE-25) 37.5-25 MG tablet Take 1 tablet by mouth daily.     . Melatonin 3 MG CAPS Take 1 capsule (3 mg total) by mouth at bedtime. (Patient taking differently: Take 3 mg by mouth at bedtime as needed (for sleep.). ) 30 capsule 0   No current facility-administered medications for this visit.     Allergies as of 07/21/2018 - Review Complete 07/21/2018  Allergen Reaction Noted  . Ace inhibitors Cough 03/07/2016    Vitals: BP 117/69   Pulse 88  Resp 18   Ht 5' 10.5" (1.791 m)   Wt 187 lb 8 oz (85 kg)   BMI 26.52 kg/m  Last Weight:  Wt Readings from Last 1 Encounters:  07/21/18 187 lb 8 oz (85 kg)   OFB:PZWC mass index is 26.52 kg/m.     Last Height:   Ht Readings from Last 1 Encounters:  07/21/18 5' 10.5" (1.791 m)    Physical exam:  General: The patient is awake, alert and  well groomed. Head: Normocephalic, atraumatic.  Neck is supple. Mallampati 2 neck circumference:16.25,  Nasal  airflow patent.   No Retrognathia - Full dentures.  Cardiovascular:  Regular rate and rhythm, without murmurs or carotid bruit, and without distended neck veins. Respiratory: Lungs are clear to auscultation. Skin:  Without evidence of edema, or rash Trunk: BMI is low. The patient's posture is erect.   Neurologic exam : The patient is awake and alert, oriented to place and time.   Memory subjective described as intact.  Attention span & concentration ability appears normal.  His wife also does not feel that he has memory loss. Speech is fluent, without dysarthria, dysphonia or aphasia.  Mood and affect are appropriate, concerned.  MMSE - Mini Mental State Exam 07/21/2018 12/24/2017  Orientation to time 5 5  Orientation to Place 5 5  Registration 3 3  Attention/ Calculation 5 5  Recall 2 1  Language- name 2 objects 2 2  Language- repeat 1 1  Language- follow 3 step command 3 2  Language- read & follow direction 1 1  Write a sentence 1 1  Copy design 1 1  Total score 29 27      Cranial nerves: Preserved sense of taste and smell. Pupils are equally reactive to light. Right eye status post cataract surgery. Extraocular movements in vertical and horizontal planes intact and without nystagmus. Visual fields by finger perimetry are intact. Hearing to finger rub intact. Facial sensation intact to fine touch. Facial motor strength is symmetric and tongue and uvula move midline. Shoulder shrug was symmetrical.  Motor exam:  [name] presents with cog-wheeling rigidity over the right biceps only, slight increase in tone at the right wrist.  There is a right hand resting tremor and action tremor noted. None of these findings in the left hand.He noticed a change in his handwriting and that he has to concentrate on bringing food to his mouth.  Sensory:  Fine touch, pinprick and vibration were normal. Coordination: Rapid alternating movements in the fingers/hands was normal.  Finger-to-nose  maneuver with right hand tremor. He feels clumsy- with handwriting changes.  Gait and station: Patient walks without assistive device .Stance is stable and normal.   Deep tendon reflexes: in the  upper and lower extremities are symmetric and intact. Babinski maneuver response is downgoing.  The patient was advised of the nature of the diagnosed sleep disorder ( REM BD ) , the treatment options and risks for general a health and wellness arising from not treating the condition.   I spent more than 25  minutes of face to face time with the patient.  I truly appreciated that Mrs. Purdue came to this visit with her husband and her input has helped greatly. 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 I would like for [name] to follow [place] every 6 months to see if there is a development in terms of a less essential and  more resting tremor, rigidity or memory loss. He has not shown any decrease in cognitive function.    Greater than 50% of time was spent in counseling and coordination of care. We have discussed the diagnosis and differential and I answered the patient's questions.     Assessment:  After physical and neurologic examination, review of laboratory studies,  Personal review of  polysomnography/ neurophysiology testing and pre-existing records as far as provided in visit., my assessment is   1) essential bilateral  TREMOR, but also right dominant cog wheeling-  REM BD :   Essential tremor was diagnosed in the past, now there is a differrent quality to his tremor. there is actually very little resting tremor but more of an action tremor noted but it is associated with biceps rigidity.  This is a mixed finding but in light of the described parasomnia activity I would keep in mind that he may develop Parkinson later on  - at this time I would not diagnose him. His sleep study has confirmed  that he has REM behavior related enactment of visit dreams.  He is at a higher risk of converting to PD.   Melatonin at 3 mg taken 30 minutes before intended bedtime each night.  If melatonin doesn't work will use 0.25 mg Klonopin.  Will follow up alternating with NP q 6 month;  MOCA if MMSE over 26 points, please.  Start with  MMSE.    Asencion Partridge Jordy Hewins MD  07/21/2018   CC: Deland Pretty, [address] [place], [place] [address]

## 2018-07-24 ENCOUNTER — Encounter: Payer: Self-pay | Admitting: Cardiology

## 2018-07-24 ENCOUNTER — Ambulatory Visit (INDEPENDENT_AMBULATORY_CARE_PROVIDER_SITE_OTHER): Payer: Medicare Other | Admitting: Cardiology

## 2018-07-24 VITALS — BP 129/79 | HR 86 | Ht 70.0 in | Wt 185.0 lb

## 2018-07-24 DIAGNOSIS — I714 Abdominal aortic aneurysm, without rupture, unspecified: Secondary | ICD-10-CM

## 2018-07-24 DIAGNOSIS — I428 Other cardiomyopathies: Secondary | ICD-10-CM | POA: Diagnosis not present

## 2018-07-24 MED ORDER — APIXABAN 5 MG PO TABS: 5.0000 mg | ORAL_TABLET | Freq: Two times a day (BID) | ORAL | 3 refills | Status: DC

## 2018-07-24 NOTE — Progress Notes (Signed)
Patient is here for follow up visit.  Subjective:   Tyler Hayes, male    DOB: [birth year], 73 y.o.   MRN: [id number]   Chief Complaint  Patient presents with  . Results    f/u tests      HPI   73 year old African-American male with hypertension, type II diabetes mellitus, former smoker, originally seen for abnormal EKG. work-up was significant for echocardiogram suggesting LV non-compaction.  He subsequently underwent left and right heart catheterization that showed normal filling pressures and no obstructive coronary artery disease.  Subsequent MRI confirmed the finding of LV non-compaction especially in the posterior wall.   Patient is here to discuss results, with his wife.  Currently, he does not have any symptoms of chest pain, shortness of breath, leg edema, orthopnea, PND.  Reviewing his family history further, it appears that his mother had coronary artery disease and congestive heart failure, and another sister has coronary artery disease with coronary stent placement.  He does not have any extensive family history of heart failure.  Patient underwent colonoscopy a few years ago and was reportedly normal.  He was found to have diverticulosis, but has never had any significant bleeding.  He does have external hemorrhoids, again without any significant bleeding.  He denies any melena or hematemesis.   Past Medical History:  Diagnosis Date  . Arthritis    back  . Cancer [facility])    prostate  . Diabetes mellitus without complication ([place])    on meds  . Diverticulosis   . Hypercholesteremia   . Hypertension   . Meatal stenosis   . Osteoarthritis   . Sinus drainage   . Tinea pedis      Past Surgical History:  Procedure Laterality Date  . BACK SURGERY  1984, 2013   lumbar  . DG DILATION URETERS  2003  . EYE SURGERY Bilateral 1999   Lasik  . LEFT HEART CATH AND CORONARY ANGIOGRAPHY N/A 05/26/2018   Procedure: LEFT HEART CATH AND CORONARY ANGIOGRAPHY;  Surgeon:  Nigel Mormon, MD;  Location: [facility];  Service: Cardiovascular;  Laterality: N/A;  . POSTERIOR LUMBAR FUSION 4 LEVEL N/A 06/01/2014   Procedure: LUMBAR THREE TO FOUR, LUMBAR FOUR TO FIVE LAMINECTOMY,  RIGHT LUMBAR FUSION AT LUMBAR FOUR TO FIVE;  Surgeon: Floyce Stakes, MD;  Location: [facility];  Service: Neurosurgery;  Laterality: N/A;  POSSIBLE L2-3 L3-4 L4-5 L5-S1 POSTERIOR LUMBAR INTERBODY FUSION  . PROSTATECTOMY  2002     Social History   Socioeconomic History  . Marital status: Married    Spouse name: Not on file  . Number of children: Not on file  . Years of education: Not on file  . Highest education level: Not on file  Occupational History  . Not on file  Social Needs  . Financial resource strain: Not on file  . Food insecurity:    Worry: Not on file    Inability: Not on file  . Transportation needs:    Medical: Not on file    Non-medical: Not on file  Tobacco Use  . Smoking status: Former Smoker    Packs/day: 1.50    Years: 20.00    Pack years: 30.00    Types: Cigarettes    Last attempt to quit: 02/01/1991    Years since quitting: 27.4  . Smokeless tobacco: Never Used  Substance and Sexual Activity  . Alcohol use: Yes    Comment: occasionally  . Drug use: No  .  Sexual activity: Not on file  Lifestyle  . Physical activity:    Days per week: Not on file    Minutes per session: Not on file  . Stress: Not on file  Relationships  . Social connections:    Talks on phone: Not on file    Gets together: Not on file    Attends religious service: Not on file    Active member of club or organization: Not on file    Attends meetings of clubs or organizations: Not on file    Relationship status: Not on file  . Intimate partner violence:    Fear of current or ex partner: Not on file    Emotionally abused: Not on file    Physically abused: Not on file    Forced sexual activity: Not on file  Other Topics Concern  . Not on file  Social History  Narrative  . Not on file     Current Outpatient Medications on File Prior to Visit  Medication Sig Dispense Refill  . atorvastatin (LIPITOR) 10 MG tablet Take 10 mg by mouth daily.    . Cholecalciferol (GNP VITAMIN D MAXIMUM STRENGTH) 50 MCG (2000 UT) TABS Take 2,000 Units by mouth daily.    . clonazePAM (KLONOPIN) 0.5 MG tablet At bedtime prn, use 1/2 tab and if needed add second half. 30 tablet 5  . Melatonin 3 MG CAPS Take 1 capsule (3 mg total) by mouth at bedtime. (Patient taking differently: Take 3 mg by mouth at bedtime as needed (for sleep.). ) 30 capsule 0  . montelukast (SINGULAIR) 10 MG tablet Take 10 mg by mouth every evening.    Marland Kitchen SYNJARDY XR 09-998 MG TB24 Take 1 tablet by mouth daily.     Marland Kitchen triamterene-hydrochlorothiazide (MAXZIDE-25) 37.5-25 MG tablet Take 1 tablet by mouth daily.     . fluticasone (FLONASE) 50 MCG/ACT nasal spray Place 1 spray into both nostrils daily as needed for rhinitis (drainage issues.).     Marland Kitchen leflunomide (ARAVA) 10 MG tablet Take 10 mg by mouth daily.     No current facility-administered medications on file prior to visit.     Cardiovascular studies:  Coronary angiography 05/26/2018: LM: Normal LAD: Normal LCx: Normal Ramus intermedius: Normal RCA: Mid focal 20% disease LVEDP normal  Conclusion: Minimal nonobstructive coronary artery disease Normal LVEDP  Cardiac MRI 06/18/2018: 1. Mild LVE with diffuse hypokinesis and abnormal septal motion EF 42% 2. Mild ventricular non compaction ratio using posterior wall thickness 2:1 3.  Normal RV size and function  Echocardiogram 05/04/2018: Left ventricle cavity is small. Mild concentric hypertrophy of the left ventricle. 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 There is mild hypokinesis of the the anterior, anterolateral walls. Mild decrease in LV systolic function. Doppler evidence of grade I (impaired) diastolic dysfunction, normal LAP. Visual EF is 40-45%.  Trace  mitral regurgitation. Trace tricuspid regurgitation.  Lexiscan Nulcear stress test 05/02/2018: 1. Lexiscan stress with low level exercise was performed. Exercise capacity was not assessed. No stress symptoms reported. Blood pressure was normal. The resting electrocardiogram demonstrated normal sinus rhythm, normal resting conduction and no resting arrhythmias.  Anteroseptal T wave inversions seen. Stress EKG is non diagnostic for ischemia as it is a pharmacologic stress. However, heart rate increased to 87% of maximum predicted heart rate on level exercise. Stress electrocardiogram showed The stress electrocardiogram showed sinus tachycardia with ventricular bigeminy and persistent anteroseptal T wave inversion. This progressed to LBBB, that persisted several minutes into  recovery.  2. The overall quality of the study is good.  Left ventricular cavity is noted to be enlarged on the rest and stress studies.  Gated SPECT imaging demonstrates anteroseptal dyskinesis along with global diffuse hypokinesis. LVEF calculated at 37%. SPECT images show medium sized, mild intensity, perfusion defect in apical to basal anteroseptal, more prominent on rest images. While these perfusion defects could be related to differential septal perfusion, ischemia cannot be excluded.  3. High risk study. Clinical correlation recommended due to possibility of differential septal perfusion during exercise induced left bundle branch block.  Abdominal aortic duplex 05/04/2018: Severe calcific plaque throughout the abdominal aorta.  Normal flow velocities noted in aorta and iliac vessels.  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  Recheck in 2 years.    Review of Systems  Constitution: Negative for decreased appetite, malaise/fatigue, weight gain and weight loss.  HENT: Negative for congestion.   Eyes: Negative for visual disturbance.  Cardiovascular: Negative for  chest pain, dyspnea on exertion, leg swelling, palpitations and syncope.  Respiratory: Negative for shortness of breath.   Endocrine: Negative for cold intolerance.  Hematologic/Lymphatic: Does not bruise/bleed easily.  Skin: Negative for itching and rash.  Musculoskeletal: Negative for myalgias.  Gastrointestinal: Negative for abdominal pain, nausea and vomiting.  Genitourinary: Negative for dysuria.  Neurological: Negative for dizziness and weakness.  Psychiatric/Behavioral: The patient is not nervous/anxious.   All other systems reviewed and are negative.      Objective:    Vitals:   07/24/18 1521  BP: 129/79  Pulse: 86  SpO2: 95%     Physical Exam  Constitutional: He is oriented to person, place, and time. He appears well-developed and well-nourished. No distress.  HENT:  Head: Normocephalic and atraumatic.  Eyes: Pupils are equal, round, and reactive to light. Conjunctivae are normal.  Neck: No JVD present.  Cardiovascular: Normal rate, regular rhythm and intact distal pulses.  No murmur heard. Pulmonary/Chest: Effort normal and breath sounds normal. He has no wheezes. He has no rales.  Abdominal: Soft. Bowel sounds are normal. There is no rebound.  Musculoskeletal:        General: No edema.  Lymphadenopathy:    He has no cervical adenopathy.  Neurological: He is alert and oriented to person, place, and time. No cranial nerve deficit.  Skin: Skin is warm and dry.  Psychiatric: He has a normal mood and affect.  Nursing note and vitals reviewed.       Assessment & Recommendations:    73 year old African-American male with uncontrolled hypertension, type II diabetes mellitus, former smoker, new diagnosis of LV noncompaction  1. Left ventricular noncompaction [facility]) Isolated finding without clinical evidence of heart failure. EF 42% without arrhthymias. No indication for ICD.  CHA2DS2VAsc score 3. Annual stroke risk 3%. Given no significant bleeding issues.  Recommend starting eliquis  Mg bid. I discussed the diagnosis, prognosis, risks/benfits of anticoagulation at length with patient and wife. All questions answered. Stopped Aspirin to reduce bleeding risk. I offered the patient referral to genetic counseling. Given no extensive family h/o heart failure, it is reasonable to defer this for now.   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 Cath with no obstructive CAD. Cardiac MRI pending. CLinically not in heart failure.  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 Repeat ultrasound in 04/2020 Continue statin  Hypertension: Controlled. Continue current therapy.  Type 2 DM: Continue current  therapy.  I will see him back in 3 months.  Total time spent with patient was 30 minutes and greater than 50% of that time was spent in counseling and coordination care with the patient regarding complex decision making and discussion as state above.    Nigel Mormon, MD [facility] Pager: [phone] Office: [phone] If no answer Cell [phone]

## 2018-08-03 ENCOUNTER — Encounter: Payer: Self-pay | Admitting: Cardiology

## 2018-08-03 DIAGNOSIS — M1990 Unspecified osteoarthritis, unspecified site: Secondary | ICD-10-CM | POA: Diagnosis not present

## 2018-08-03 DIAGNOSIS — Z79899 Other long term (current) drug therapy: Secondary | ICD-10-CM | POA: Diagnosis not present

## 2018-08-03 DIAGNOSIS — M79643 Pain in unspecified hand: Secondary | ICD-10-CM | POA: Diagnosis not present

## 2018-08-03 DIAGNOSIS — R768 Other specified abnormal immunological findings in serum: Secondary | ICD-10-CM | POA: Diagnosis not present

## 2018-08-18 ENCOUNTER — Encounter: Payer: Self-pay | Admitting: Cardiology

## 2018-08-18 NOTE — Progress Notes (Signed)
To whom it may concern,  I see Mr. Kilgallon for his cardia condition. Given his cardiac condition, I advise against long distance/international travel at this time.   Sincerely,   Vernell Leep MD

## 2018-10-22 DIAGNOSIS — I10 Essential (primary) hypertension: Secondary | ICD-10-CM | POA: Diagnosis not present

## 2018-10-22 DIAGNOSIS — N183 Chronic kidney disease, stage 3 (moderate): Secondary | ICD-10-CM | POA: Diagnosis not present

## 2018-10-22 DIAGNOSIS — E7800 Pure hypercholesterolemia, unspecified: Secondary | ICD-10-CM | POA: Diagnosis not present

## 2018-10-22 DIAGNOSIS — E118 Type 2 diabetes mellitus with unspecified complications: Secondary | ICD-10-CM | POA: Diagnosis not present

## 2018-11-03 DIAGNOSIS — M549 Dorsalgia, unspecified: Secondary | ICD-10-CM | POA: Diagnosis not present

## 2018-11-03 DIAGNOSIS — M25512 Pain in left shoulder: Secondary | ICD-10-CM | POA: Diagnosis not present

## 2018-11-03 DIAGNOSIS — Z79899 Other long term (current) drug therapy: Secondary | ICD-10-CM | POA: Diagnosis not present

## 2018-11-03 DIAGNOSIS — M79643 Pain in unspecified hand: Secondary | ICD-10-CM | POA: Diagnosis not present

## 2018-11-03 DIAGNOSIS — R768 Other specified abnormal immunological findings in serum: Secondary | ICD-10-CM | POA: Diagnosis not present

## 2018-11-03 DIAGNOSIS — M1990 Unspecified osteoarthritis, unspecified site: Secondary | ICD-10-CM | POA: Diagnosis not present

## 2018-11-03 NOTE — Progress Notes (Signed)
I connected with the patient on 11/04/2018 by a video enabled telemedicine application and verified that I am speaking with the correct person using two identifiers.     I discussed the limitations of evaluation and management by telemedicine and the availability of in person appointments. The patient expressed understanding and agreed to proceed.   This visit type was conducted due to national recommendations for restrictions regarding the COVID-19 Pandemic (e.g. social distancing).  This format is felt to be most appropriate for this patient at this time.  All issues noted in this document were discussed and addressed.  No physical exam was performed (except for noted visual exam findings with Tele health visits).  The patient has consented to conduct a Tele health visit and understands insurance will be billed.   Patient is here for follow up visit.  Subjective:   Tyler Hayes, male    DOB: 12-Feb-1946, 73 y.o.   MRN: [id number]   Chief Complaint  Patient presents with   LV non-compaction    3 month f/u     HPI   73 year old African-American male with hypertension, type II diabetes mellitus, former smoker, originally seen for abnormal EKG. work-up was significant for echocardiogram suggesting LV non-compaction.  He subsequently underwent left and right heart catheterization that showed normal filling pressures and no obstructive coronary artery disease.  Subsequent MRI confirmed the finding of LV non-compaction especially in the posterior wall.   Isolated finding without clinical evidence of heart failure. EF 42% without arrhthymias. No indication for ICD. CHA2DS2VAsc score 3. Annual stroke risk 3%. Given no significant bleeding issues, I recommended starting eliquis 5 mg bid. I discussed the diagnosis, prognosis, risks/benfits of anticoagulation at length with patient and wife. All questions answered. Stopped Aspirin to reduce bleeding risk. I offered the patient referral to genetic  counseling. Given no extensive family h/o heart failure, it is reasonable to defer this for now.   Since his last visit, he has been doing well. He stays active doing yardwork around the house. He denies chest pain, shortness of breath, palpitations, leg edema, orthopnea, PND, TIA/syncope. His blood pressure is elevated today,   Does a lot of yardwork, physical work. No symptoms. No leg swelling.   Two sons. One has hypertension, DM, obesity  Past Medical History:  Diagnosis Date   Arthritis    back   Cancer ([place])    prostate   Diabetes mellitus without complication ([place])    on meds   Diverticulosis    Hypercholesteremia    Hypertension    Meatal stenosis    Osteoarthritis    Sinus drainage    Tinea pedis      Past Surgical History:  Procedure Laterality Date   BACK SURGERY  1984, 2013   lumbar   DG DILATION URETERS  2003   EYE SURGERY Bilateral 1999   Lasik   LEFT HEART CATH AND CORONARY ANGIOGRAPHY N/A 05/26/2018   Procedure: LEFT HEART CATH AND CORONARY ANGIOGRAPHY;  Surgeon: Nigel Mormon, MD;  Location: [facility];  Service: Cardiovascular;  Laterality: N/A;   POSTERIOR LUMBAR FUSION 4 LEVEL N/A 06/01/2014   Procedure: LUMBAR THREE TO FOUR, LUMBAR FOUR TO FIVE LAMINECTOMY,  RIGHT LUMBAR FUSION AT LUMBAR FOUR TO FIVE;  Surgeon: Floyce Stakes, MD;  Location: [facility];  Service: Neurosurgery;  Laterality: N/A;  POSSIBLE L2-3 L3-4 L4-5 L5-S1 POSTERIOR LUMBAR INTERBODY FUSION   PROSTATECTOMY  2002     Social History   Socioeconomic  History   Marital status: Married    Spouse name: Not on file   Number of children: Not on file   Years of education: Not on file   Highest education level: Not on file  Occupational History   Not on file  Social Needs   Financial resource strain: Not on file   Food insecurity    Worry: Not on file    Inability: Not on file   Transportation needs    Medical: Not on file    Non-medical:  Not on file  Tobacco Use   Smoking status: Former Smoker    Packs/day: 1.50    Years: 20.00    Pack years: 30.00    Types: Cigarettes    Quit date: 02/01/1991    Years since quitting: 27.7   Smokeless tobacco: Never Used  Substance and Sexual Activity   Alcohol use: Yes    Comment: occasionally   Drug use: No   Sexual activity: Not on file  Lifestyle   Physical activity    Days per week: Not on file    Minutes per session: Not on file   Stress: Not on file  Relationships   Social connections    Talks on phone: Not on file    Gets together: Not on file    Attends religious service: Not on file    Active member of club or organization: Not on file    Attends meetings of clubs or organizations: Not on file    Relationship status: Not on file   Intimate partner violence    Fear of current or ex partner: Not on file    Emotionally abused: Not on file    Physically abused: Not on file    Forced sexual activity: Not on file  Other Topics Concern   Not on file  Social History Narrative   Not on file     Current Outpatient Medications on File Prior to Visit  Medication Sig Dispense Refill   apixaban (ELIQUIS) 5 MG TABS tablet Take 1 tablet (5 mg total) by mouth 2 (two) times daily. 60 tablet 3   atorvastatin (LIPITOR) 10 MG tablet Take 10 mg by mouth daily.     Cholecalciferol (GNP VITAMIN D MAXIMUM STRENGTH) 50 MCG (2000 UT) TABS Take 2,000 Units by mouth daily.     clonazePAM (KLONOPIN) 0.5 MG tablet At bedtime prn, use 1/2 tab and if needed add second half. 30 tablet 5   fluticasone (FLONASE) 50 MCG/ACT nasal spray Place 1 spray into both nostrils daily as needed for rhinitis (drainage issues.).      leflunomide (ARAVA) 10 MG tablet Take 10 mg by mouth daily.     Melatonin 3 MG CAPS Take 1 capsule (3 mg total) by mouth at bedtime. (Patient taking differently: Take 3 mg by mouth at bedtime as needed (for sleep.). ) 30 capsule 0   montelukast (SINGULAIR) 10  MG tablet Take 10 mg by mouth every evening.     SYNJARDY XR 09-998 MG TB24 Take 1 tablet by mouth daily.      triamterene-hydrochlorothiazide (MAXZIDE-25) 37.5-25 MG tablet Take 1 tablet by mouth daily.      No current facility-administered medications on file prior to visit.     Cardiovascular studies:  Coronary angiography 05/26/2018: LM: Normal LAD: Normal LCx: Normal Ramus intermedius: Normal RCA: Mid focal 20% disease LVEDP normal  Conclusion: Minimal nonobstructive coronary artery disease Normal LVEDP  Cardiac MRI 06/18/2018: 1. Mild LVE with diffuse hypokinesis  and abnormal septal motion EF 42% 2. Mild ventricular non compaction ratio using posterior wall thickness 2:1 3.  Normal RV size and function  Echocardiogram 05/04/2018: Left ventricle cavity is small. Mild concentric hypertrophy of the left ventricle. 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 There is mild hypokinesis of the the anterior, anterolateral walls. Mild decrease in LV systolic function. Doppler evidence of grade I (impaired) diastolic dysfunction, normal LAP. Visual EF is 40-45%.  Trace mitral regurgitation. Trace tricuspid regurgitation.  Lexiscan Nulcear stress test 05/02/2018: 1. Lexiscan stress with low level exercise was performed. Exercise capacity was not assessed. No stress symptoms reported. Blood pressure was normal. The resting electrocardiogram demonstrated normal sinus rhythm, normal resting conduction and no resting arrhythmias.  Anteroseptal T wave inversions seen. Stress EKG is non diagnostic for ischemia as it is a pharmacologic stress. However, heart rate increased to 87% of maximum predicted heart rate on level exercise. Stress electrocardiogram showed The stress electrocardiogram showed sinus tachycardia with ventricular bigeminy and persistent anteroseptal T wave inversion. This progressed to LBBB, that persisted several minutes into recovery.  2. The overall  quality of the study is good.  Left ventricular cavity is noted to be enlarged on the rest and stress studies.  Gated SPECT imaging demonstrates anteroseptal dyskinesis along with global diffuse hypokinesis. LVEF calculated at 37%. SPECT images show medium sized, mild intensity, perfusion defect in apical to basal anteroseptal, more prominent on rest images. While these perfusion defects could be related to differential septal perfusion, ischemia cannot be excluded.  3. High risk study. Clinical correlation recommended due to possibility of differential septal perfusion during exercise induced left bundle branch block.  Abdominal aortic duplex 05/04/2018: Severe calcific plaque throughout the abdominal aorta.  Normal flow velocities noted in aorta and iliac vessels.  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  Recheck in 2 years.    Review of Systems  Constitution: Negative for decreased appetite, malaise/fatigue, weight gain and weight loss.  HENT: Negative for congestion.   Eyes: Negative for visual disturbance.  Cardiovascular: Negative for chest pain, dyspnea on exertion, leg swelling, palpitations and syncope.  Respiratory: Negative for shortness of breath.   Endocrine: Negative for cold intolerance.  Hematologic/Lymphatic: Does not bruise/bleed easily.  Skin: Negative for itching and rash.  Musculoskeletal: Negative for myalgias.  Gastrointestinal: Negative for abdominal pain, nausea and vomiting.  Genitourinary: Negative for dysuria.  Neurological: Negative for dizziness and weakness.  Psychiatric/Behavioral: The patient is not nervous/anxious.   All other systems reviewed and are negative.      Objective:    Vitals:   11/03/18 1039 11/04/18 1032  BP: 122/74 (!) 148/95  Pulse:  97     Physical Exam  Constitutional: He is oriented to person, place, and time. He appears well-developed and well-nourished. No distress.    Pulmonary/Chest: Effort normal.  Neurological: He is alert and oriented to person, place, and time.  Psychiatric: He has a normal mood and affect.  Nursing note and vitals reviewed.       Assessment & Recommendations:    73 year old African-American male with uncontrolled hypertension, type II diabetes mellitus, former smoker, new diagnosis of LV noncompaction  Left ventricular noncompaction [facility]) Isolated finding without clinical evidence of heart failure. EF 42% without arrhthymias. No indication for ICD.  Given absence of any cardiomyopathy symptoms, not on any guideline directed medical therapy for heart failure. CHA2DS2VAsc score 3. Annual stroke risk 3%.  Tolerating eliquis 5 mg bid. I referred the patient to geneticist Dr. Broadus John.  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 Repeat ultrasound in 04/2020 Continue statin  Hypertension: Elevated BP today. Encouraged the patient to monitor this regularly. If consistently >140/80 mmHg, will start losartan or Entresto.  Type 2 DM: Continue current therapy. Managed by PCP.  I will see him back in 2 months.  Nigel Mormon, MD [facility] Pager: [phone] Office: [phone] If no answer Cell [phone]

## 2018-11-04 ENCOUNTER — Other Ambulatory Visit: Payer: Self-pay

## 2018-11-04 ENCOUNTER — Ambulatory Visit (INDEPENDENT_AMBULATORY_CARE_PROVIDER_SITE_OTHER): Payer: Medicare Other | Admitting: Cardiology

## 2018-11-04 ENCOUNTER — Encounter: Payer: Self-pay | Admitting: Cardiology

## 2018-11-04 VITALS — BP 148/95 | HR 97 | Ht 70.0 in | Wt 184.0 lb

## 2018-11-04 DIAGNOSIS — I428 Other cardiomyopathies: Secondary | ICD-10-CM

## 2018-11-04 DIAGNOSIS — I714 Abdominal aortic aneurysm, without rupture, unspecified: Secondary | ICD-10-CM

## 2018-11-04 DIAGNOSIS — I10 Essential (primary) hypertension: Secondary | ICD-10-CM | POA: Insufficient documentation

## 2018-11-04 MED ORDER — APIXABAN 5 MG PO TABS: 5.0000 mg | ORAL_TABLET | Freq: Two times a day (BID) | ORAL | 3 refills | Status: DC

## 2018-11-26 ENCOUNTER — Ambulatory Visit: Payer: Medicare Other | Admitting: Genetic Counselor

## 2018-11-26 ENCOUNTER — Other Ambulatory Visit: Payer: Self-pay

## 2018-12-01 DIAGNOSIS — M1990 Unspecified osteoarthritis, unspecified site: Secondary | ICD-10-CM | POA: Diagnosis not present

## 2018-12-01 DIAGNOSIS — Z79899 Other long term (current) drug therapy: Secondary | ICD-10-CM | POA: Diagnosis not present

## 2018-12-04 ENCOUNTER — Telehealth: Payer: Medicare Other | Admitting: Cardiovascular Disease

## 2018-12-11 NOTE — Progress Notes (Signed)
Pre-test GC notes   Due to the COVID-19 pandemic, patient consents to having a virtual genetic consult and for the visit details to be documented in his notes. Patient identity was confirmed using two unique identifiers of full name and date of birth   Referral Reason Chao Blazejewski was referred by Dr. 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  Pre-test Genetic Consult notes  I informed Tyon that isolated left ventricular noncompaction is considered a genetic cardiomyopathy. While it can be associated with other conditions such as Barth syndrome, mitochondrial disorders and myotonic dystrophy, or concomitantly present with hypertrophic and dilated cardiomyopathy, the major genetic cause for familial noncompaction has yet to be identified. I explained to him that there is extreme variability of the LVNC morphological spectrum and that it may also occur as an anatomic variant of left ventricular structure instead of a type of heart disease. He verbalized understanding of this.   I discussed the genetics of LVNC and explained that depending upon the underlying cause it may have X-linked or autosomal inheritance. We walked through the process of genetic testing. Some studies have reported variants in sarcomeric genes in 29% to 41% of adult patients with LVNC. His medical and 5-generation family history was obtained. See details below-   Traditional Risk Factors Praveen denies having other genetic syndromes, like Barth syndrome or chromosome 1p36 deletion syndrome or mitochondrial disorders and myotonic dystrophy that can be associated with LVNC.  Evergreen (III.4 on pedigree) is a pleasant 73 year old Caucasian gentleman who was told of having an variation in his EKG profile at his annual physical. He was referred to a cardiologist and underwent cardiac MRI that indicated LVNC  with a [place]:C of 2:1 and EF of 42%. He denies having any symptoms other than feeling short-winded and lowered physical stamina.  Family history Sebasthian (III.4) is one of 8 children. His siblings (III.2, III.3, III.5-III.8) and their kids (IV.1, IV.2, IV.5-IV.9) are mostly in good health and do not report any major cardiovascular issues. His father (II.3) had a brain tumor that he states led to muscle wasting and eventually death at age 66. His paternal relatives did not have overt heart disease that he is aware of. His mother was found to have congestive heart failure at age 44.He states that she did not seek regular medical care as it was not readily available in the [place] of [place]. She died in her sleep at age 42. An autopsy was not done to confirm the cause of her death. Her siblings (II.5-II.7) lived to their 71s and were in good health.  Impression  In summary, Rohith presents with LVNC at age 73 and is asymptomatic. The fact that his mother had CHF at a young age and died soon after is concerning. As Robey is asymptomatic and his [place]:C ratio is below the diagnostic cut-off for LVNC, it is likely that he has a benign anatomic variant and hence, genetic testing is not warranted. Ericson verbalized understanding of this.  Please note that the patient has not been counseled in this visit on personal, cultural or ethical issues that she may face due to her heart condition.    Lattie Corns, Ph.D, [facility] Clinical Molecular Geneticist

## 2018-12-28 ENCOUNTER — Encounter: Payer: Self-pay | Admitting: Cardiology

## 2018-12-28 ENCOUNTER — Other Ambulatory Visit: Payer: Self-pay

## 2018-12-28 ENCOUNTER — Ambulatory Visit (INDEPENDENT_AMBULATORY_CARE_PROVIDER_SITE_OTHER): Payer: Medicare Other | Admitting: Cardiology

## 2018-12-28 VITALS — BP 147/80 | HR 93 | Ht 70.5 in | Wt 183.4 lb

## 2018-12-28 DIAGNOSIS — I10 Essential (primary) hypertension: Secondary | ICD-10-CM

## 2018-12-28 DIAGNOSIS — I714 Abdominal aortic aneurysm, without rupture, unspecified: Secondary | ICD-10-CM

## 2018-12-28 MED ORDER — AMLODIPINE BESYLATE 5 MG PO TABS: 5.0000 mg | ORAL_TABLET | Freq: Every day | ORAL | 5 refills | Status: DC

## 2018-12-28 MED ORDER — ASPIRIN EC 81 MG PO TBEC: 81.0000 mg | DELAYED_RELEASE_TABLET | Freq: Every day | ORAL | 3 refills | Status: DC

## 2018-12-28 NOTE — Progress Notes (Signed)
Patient is here for follow up visit.  Subjective:   Tyler Hayes, male    DOB: 1945-11-04, 73 y.o.   MRN: [id number]   Chief Complaint  Patient presents with  . Hypertension  . Follow-up    8week      HPI  73 year old African-American male with uncontrolled hypertension, type II diabetes mellitus, former smoker, new diagnosis of LV noncompaction  Patient was see by clinical molecular geneticist Dr. Lattie Corns. Her note below.  In summary, Tyler Hayes presents with LVNC at age 63 and is asymptomatic. The fact that his mother had CHF at a young age and died soon after is concerning. As Tyler Hayes is asymptomatic and his [place]:C ratio is below the diagnostic cut-off for LVNC, it is likely that he has a benign anatomic variant and hence, genetic testing is not warranted. Felicia verbalized understanding of this.  Patient denies any chest pain, shortness of breath. He states that when he is "out of shape", like now, he experiences mucus buildup in his chest on exertion; but this gets better as he improves his conditioning.   His blood pressure remains elevated. After reading the literature that stated that it can increase blood pressure, he stopped leflunamide without any significant improvement in his blood pressure.    Past Medical History:  Diagnosis Date  . Arthritis    back  . [facility])    prostate  . Diabetes mellitus without complication ([place])    on meds  . Diverticulosis   . Hypercholesteremia   . Hypertension   . Meatal stenosis   . Osteoarthritis   . Sinus drainage   . Tinea pedis      Past Surgical History:  Procedure Laterality Date  . BACK SURGERY  1984, 2013   lumbar  . DG DILATION URETERS  2003  . EYE SURGERY Bilateral 1999   Lasik  . LEFT HEART CATH AND CORONARY ANGIOGRAPHY N/A 05/26/2018   Procedure: LEFT HEART CATH AND CORONARY ANGIOGRAPHY;  Surgeon: Nigel Mormon, MD;  Location: [place] [facility];  Service: Cardiovascular;  Laterality: N/A;  .  POSTERIOR LUMBAR FUSION 4 LEVEL N/A 06/01/2014   Procedure: LUMBAR THREE TO FOUR, LUMBAR FOUR TO FIVE LAMINECTOMY,  RIGHT LUMBAR FUSION AT LUMBAR FOUR TO FIVE;  Surgeon: Floyce Stakes, MD;  Location: [facility];  Service: Neurosurgery;  Laterality: N/A;  POSSIBLE L2-3 L3-4 L4-5 L5-S1 POSTERIOR LUMBAR INTERBODY FUSION  . PROSTATECTOMY  2002     Social History   Socioeconomic History  . Marital status: Married    Spouse name: Not on file  . Number of children: Not on file  . Years of education: Not on file  . Highest education level: Not on file  Occupational History  . Not on file  Social Needs  . Financial resource strain: Not on file  . Food insecurity    Worry: Not on file    Inability: Not on file  . Transportation needs    Medical: Not on file    Non-medical: Not on file  Tobacco Use  . Smoking status: Former Smoker    Packs/day: 1.50    Years: 20.00    Pack years: 30.00    Types: Cigarettes    Quit date: 02/01/1991    Years since quitting: 27.9  . Smokeless tobacco: Never Used  Substance and Sexual Activity  . Alcohol use: Yes    Comment: occasionally  . Drug use: No  . Sexual activity: Not on  file  Lifestyle  . Physical activity    Days per week: Not on file    Minutes per session: Not on file  . Stress: Not on file  Relationships  . Social Herbalist on phone: Not on file    Gets together: Not on file    Attends religious service: Not on file    Active member of club or organization: Not on file    Attends meetings of clubs or organizations: Not on file    Relationship status: Not on file  . Intimate partner violence    Fear of current or ex partner: Not on file    Emotionally abused: Not on file    Physically abused: Not on file    Forced sexual activity: Not on file  Other Topics Concern  . Not on file  Social History Narrative  . Not on file     Current Outpatient Medications on File Prior to Visit  Medication Sig Dispense Refill   . apixaban (ELIQUIS) 5 MG TABS tablet Take 1 tablet (5 mg total) by mouth 2 (two) times daily. 180 tablet 3  . atorvastatin (LIPITOR) 10 MG tablet Take 10 mg by mouth daily.    . Cholecalciferol (GNP VITAMIN D MAXIMUM STRENGTH) 50 MCG (2000 UT) TABS Take 2,000 Units by mouth daily.    . clonazePAM (KLONOPIN) 0.5 MG tablet At bedtime prn, use 1/2 tab and if needed add second half. 30 tablet 5  . fluticasone (FLONASE) 50 MCG/ACT nasal spray Place 1 spray into both nostrils daily as needed for rhinitis (drainage issues.).     Marland Kitchen leflunomide (ARAVA) 10 MG tablet Take 20 mg by mouth daily.     . Melatonin 3 MG CAPS Take 1 capsule (3 mg total) by mouth at bedtime. (Patient taking differently: Take 3 mg by mouth at bedtime as needed (for sleep.). ) 30 capsule 0  . montelukast (SINGULAIR) 10 MG tablet Take 10 mg by mouth every evening.    Marland Kitchen SYNJARDY XR 09-998 MG TB24 Take 1 tablet by mouth daily.     Marland Kitchen triamterene-hydrochlorothiazide (MAXZIDE-25) 37.5-25 MG tablet Take 1 tablet by mouth daily.      No current facility-administered medications on file prior to visit.     Cardiovascular studies:  EKG 12/28/2018: Sinus rhythm 76 bpm. Right axis deviation. Left bundle branch block.  Coronary angiography 05/26/2018: LM: Normal LAD: Normal LCx: Normal Ramus intermedius: Normal RCA: Mid focal 20% disease LVEDP normal  Conclusion: Minimal nonobstructive coronary artery disease Normal LVEDP  Cardiac MRI 06/18/2018: 1. Mild LVE with diffuse hypokinesis and abnormal septal motion EF 42% 2. Mild ventricular non compaction ratio using posterior wall thickness 2:1 3.  Normal RV size and function  Echocardiogram 05/04/2018: Left ventricle cavity is small. Mild concentric hypertrophy of the left ventricle. 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 There is mild hypokinesis of the the anterior, anterolateral walls. Mild decrease in LV systolic function. Doppler evidence of  grade I (impaired) diastolic dysfunction, normal LAP. Visual EF is 40-45%.  Trace mitral regurgitation. Trace tricuspid regurgitation.  Lexiscan Nulcear stress test 05/02/2018: 1. Lexiscan stress with low level exercise was performed. Exercise capacity was not assessed. No stress symptoms reported. Blood pressure was normal. The resting electrocardiogram demonstrated normal sinus rhythm, normal resting conduction and no resting arrhythmias.  Anteroseptal T wave inversions seen. Stress EKG is non diagnostic for ischemia as it is a pharmacologic stress. However, heart rate increased to 87% of  maximum predicted heart rate on level exercise. Stress electrocardiogram showed The stress electrocardiogram showed sinus tachycardia with ventricular bigeminy and persistent anteroseptal T wave inversion. This progressed to LBBB, that persisted several minutes into recovery.  2. The overall quality of the study is good.  Left ventricular cavity is noted to be enlarged on the rest and stress studies.  Gated SPECT imaging demonstrates anteroseptal dyskinesis along with global diffuse hypokinesis. LVEF calculated at 37%. SPECT images show medium sized, mild intensity, perfusion defect in apical to basal anteroseptal, more prominent on rest images. While these perfusion defects could be related to differential septal perfusion, ischemia cannot be excluded.  3. High risk study. Clinical correlation recommended due to possibility of differential septal perfusion during exercise induced left bundle branch block.  Abdominal aortic duplex 05/04/2018: Severe calcific plaque throughout the abdominal aorta.  Normal flow velocities noted in aorta and iliac vessels.  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  Recheck in 2 years.    Review of Systems  Constitution: Negative for decreased appetite, malaise/fatigue, weight gain and weight loss.  HENT: Negative for  congestion.   Eyes: Negative for visual disturbance.  Cardiovascular: Negative for chest pain, dyspnea on exertion, leg swelling, palpitations and syncope.  Respiratory: Negative for shortness of breath.   Endocrine: Negative for cold intolerance.  Hematologic/Lymphatic: Does not bruise/bleed easily.  Skin: Negative for itching and rash.  Musculoskeletal: Negative for myalgias.  Gastrointestinal: Negative for abdominal pain, nausea and vomiting.  Genitourinary: Negative for dysuria.  Neurological: Negative for dizziness and weakness.  Psychiatric/Behavioral: The patient is not nervous/anxious.   All other systems reviewed and are negative.      Objective:   Vitals:   12/28/18 1042  BP: (!) 147/80  Pulse: 93  SpO2: 95%     Physical Exam  Constitutional: He is oriented to person, place, and time. He appears well-developed and well-nourished. No distress.  Pulmonary/Chest: Effort normal.  Neurological: He is alert and oriented to person, place, and time.  Psychiatric: He has a normal mood and affect.  Nursing note and vitals reviewed.       Assessment & Recommendations:    73 year old African-American male with uncontrolled hypertension, type II diabetes mellitus, former smoker, benign variant, without diagnosis of LV noncompaction  Abnormal EKG: Anatomical variant with trabeculations, but does not meet diagnostic criteria for LVNC. EF 42% without any heart failure signs/symptoms. We had a long discussion today regarding the management. He would like to be off eliquis, if possible. I think this is reasonable. Risks/benefits and uncertainty regarding evidence for or against eliquis in his case discussed.  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 Repeat ultrasound in 04/2020 Continue statin. Start Aspirin 81 mg daily.   Hypertension: Suboptimal control. Added amlodipine 5 mg daily.  Type 2 DM: Continue current therapy. Managed by PCP.  I will see him  back in 6 months.  Nigel Mormon, MD [facility] Pager: [phone] Office: [phone] If no answer Cell [phone]

## 2019-01-07 NOTE — Telephone Encounter (Signed)
From pt

## 2019-01-28 DIAGNOSIS — Z23 Encounter for immunization: Secondary | ICD-10-CM | POA: Diagnosis not present

## 2019-01-28 DIAGNOSIS — E7800 Pure hypercholesterolemia, unspecified: Secondary | ICD-10-CM | POA: Diagnosis not present

## 2019-01-28 DIAGNOSIS — E118 Type 2 diabetes mellitus with unspecified complications: Secondary | ICD-10-CM | POA: Diagnosis not present

## 2019-01-28 DIAGNOSIS — I10 Essential (primary) hypertension: Secondary | ICD-10-CM | POA: Diagnosis not present

## 2019-01-28 DIAGNOSIS — N183 Chronic kidney disease, stage 3 (moderate): Secondary | ICD-10-CM | POA: Diagnosis not present

## 2019-01-28 DIAGNOSIS — Z789 Other specified health status: Secondary | ICD-10-CM | POA: Diagnosis not present

## 2019-02-02 ENCOUNTER — Other Ambulatory Visit: Payer: Self-pay

## 2019-02-02 ENCOUNTER — Ambulatory Visit (INDEPENDENT_AMBULATORY_CARE_PROVIDER_SITE_OTHER): Payer: Medicare Other | Admitting: Adult Health

## 2019-02-02 ENCOUNTER — Encounter: Payer: Self-pay | Admitting: Adult Health

## 2019-02-02 VITALS — BP 139/86 | HR 111 | Temp 96.8°F | Ht 70.5 in | Wt 181.8 lb

## 2019-02-02 DIAGNOSIS — G4752 REM sleep behavior disorder: Secondary | ICD-10-CM | POA: Diagnosis not present

## 2019-02-02 DIAGNOSIS — G250 Essential tremor: Secondary | ICD-10-CM | POA: Diagnosis not present

## 2019-02-02 NOTE — Patient Instructions (Signed)
Your Plan:  Continue Melatonin and Klonopin for sleep behavior  Memory score is stable Can consider primidone for tremor    Thank you for coming to see [place] at [facility]. I hope we have been able to provide you high quality care today.  You may receive a patient satisfaction survey over the next few weeks. We would appreciate your feedback and comments so that we may continue to improve ourselves and the health of our patients.  Primidone tablets What is this medicine? PRIMIDONE (PRI mi done) is a barbiturate. This medicine is used to control seizures in certain types of epilepsy. It is not for use in absence (petit mal) seizures. This medicine may be used for other purposes; ask your health care provider or pharmacist if you have questions. COMMON BRAND NAME(S): Mysoline What should I tell my health care provider before I take this medicine? They need to know if you have any of these conditions:  kidney disease  liver disease  porphyria  suicidal thoughts, plans, or attempt; a previous suicide attempt by you or a family member  an unusual or allergic reaction to primidone, phenobarbital, other barbiturates or seizure medications, other medicines, foods, dyes, or preservatives  pregnant or trying to get pregnant  breast-feeding How should I use this medicine? Take this medicine by mouth with a glass of water. Follow the directions on the prescription label. Take your doses at regular intervals. Do not take your medicine more often than directed. Do not stop taking except on the advice of your doctor or health care professional. A special MedGuide will be given to you by the pharmacist with each prescription and refill. Be sure to read this information carefully each time. Contact your pediatrician or health care professional regarding the use of this medication in children. Special care may be needed. While this drug may be prescribed for children for selected  conditions, precautions do apply. Overdosage: If you think you have taken too much of this medicine contact a poison control center or emergency room at once. NOTE: This medicine is only for you. Do not share this medicine with others. What if I miss a dose? If you miss a dose, take it as soon as you can. If it is almost time for your next dose, take only that dose. Do not take double or extra doses. What may interact with this medicine? Do not take this medicine with any of the following medications:  voriconazole This medicine may also interact with the following medications:  cancer-treating medications  cyclosporine  disopyramide  doxycycline  male hormones, including contraceptive or birth control pills  medicines for mental depression, anxiety or other mood problems  medicines for treating HIV infection or AIDS  modafinil  prescription pain medications  quinidine  warfarin This list may not describe all possible interactions. Give your health care provider a list of all the medicines, herbs, non-prescription drugs, or dietary supplements you use. Also tell them if you smoke, drink alcohol, or use illegal drugs. Some items may interact with your medicine. What should I watch for while using this medicine? Visit your doctor or health care professional for regular checks on your progress. It may be 2 to 3 weeks before you see the full effects of this medicine. Do not suddenly stop taking this medicine, you may increase the risk of seizures. Your doctor or health care professional may want to gradually reduce the dose. Wear a medical identification bracelet or chain to say you have  epilepsy, and carry a card that lists all your medications. You may get drowsy or dizzy. Do not drive, use machinery, or do anything that needs mental alertness until you know how this medicine affects you. Do not stand or sit up quickly, especially if you are an older patient. This reduces the risk  of dizzy or fainting spells. Alcohol may interfere with the effect of this medicine. Avoid alcoholic drinks. Birth control pills may not work properly while you are taking this medicine. Talk to your doctor about using an extra method of birth control. The use of this medicine may increase the chance of suicidal thoughts or actions. Pay special attention to how you are responding while on this medicine. Any worsening of mood, or thoughts of suicide or dying should be reported to your health care professional right away. Women who become pregnant while using this medicine may enroll in the [place] Pregnancy Registry by calling [phone]. This registry collects information about the safety of antiepileptic drug use during pregnancy. This medicine may cause a decrease in vitamin D and folic acid. You should make sure that you get enough vitamins while you are taking this medicine. Discuss the foods you eat and the vitamins you take with your health care professional. What side effects may I notice from receiving this medicine? Side effects that you should report to your doctor or health care professional as soon as possible:  allergic reactions like skin rash, itching or hives, swelling of the face, lips, or tongue  blurred, double vision, or uncontrollable rolling or movements of the eyes  redness, blistering, peeling or loosening of the skin, including inside the mouth  shortness of breath or difficulty breathing  unusual excitement or restlessness, more likely in children and the elderly  unusually weak or tired  worsening of mood, thoughts or actions of suicide or dying Side effects that usually do not require medical attention (report to your doctor or health care professional if they continue or are bothersome):  clumsiness, unsteadiness, or a hang-over effect  decreased sexual ability  dizziness, drowsiness  loss of appetite  nausea or vomiting This  list may not describe all possible side effects. Call your doctor for medical advice about side effects. You may report side effects to FDA at 1-800-FDA-1088. Where should I keep my medicine? Keep out of the reach of children. This medicine may cause accidental overdose and death if it taken by other adults, children, or pets. Mix any unused medicine with a substance like cat litter or coffee grounds. Then throw the medicine away in a sealed container like a sealed bag or a coffee can with a lid. Do not use the medicine after the expiration date. Store at room temperature between 15 and 30 degrees C (59 and 86 degrees F). NOTE: This sheet is a summary. It may not cover all possible information. If you have questions about this medicine, talk to your doctor, pharmacist, or health care provider.  2020 Elsevier/Gold Standard (2016-12-18 15:21:47)

## 2019-02-02 NOTE — Progress Notes (Signed)
PATIENT: Tyler Hayes DOB: 12-Jan-1946  REASON FOR VISIT: follow up HISTORY FROM: patient  HISTORY OF PRESENT ILLNESS: Today 02/02/19:  [name] is a 73 year old male with a history of REM sleep behavior disorder.  He returns today for follow-up.  He states that he has been doing well with melatonin and Klonopin.  He states he only takes Klonopin if he is anxious during the day or if he takes a lot of naps.  He states that he notices that if he limits his naps during the day in limits his TV at bedtime his nighttime behavior improves.  He states that his wife has reported that he still talks in his sleep but him acting out his dreams has improved significantly.  He feels that his memory has remained stable.  He is able to complete all ADLs independently.  He operates a Teacher, music without difficulty.  The patient reports that his tremor is fairly mild.  He reports that he has the most difficulty with handwriting.  He returns today for an evaluation.  HISTORY Tyler Hayes is a 73 y.o. male , seen here as a revisit  from Dr. Shelia Media for REM Behavior [place] 07-21-2018;    Chief complaint according to patient : [name] stated " swinging, kicking and jumping in his sleep", also he mentioned that he developed a " right hand tremor". [name] reports that he acted out dreams sometimes with kicking more swaying even in his 90s. He is now in his second marriage but he remembers that his first wife was complaining about his abrupt movements and sometimes kicking her or bruising her. She died of leukemia in 2000/09/10 years ago. He also reports that he used to be physically so much more active before he retired that he felt he got deeper and more restorative sleep simply because he was physically in need of sleep. He believes that acting out dreams has increased since his retirement, which began in 09-11-11. He used to be a Holiday representative.  REVIEW OF SYSTEMS: Out of a complete 14 system  review of symptoms, the patient complains only of the following symptoms, and all other reviewed systems are negative.  See HPI  ALLERGIES: Allergies  Allergen Reactions  . Ace Inhibitors Cough    LISINOPRIL    HOME MEDICATIONS: Outpatient Medications Prior to Visit  Medication Sig Dispense Refill  . amLODipine (NORVASC) 5 MG tablet Take 1 tablet (5 mg total) by mouth daily. 30 tablet 5  . aspirin EC 81 MG tablet Take 1 tablet (81 mg total) by mouth daily. 90 tablet 3  . atorvastatin (LIPITOR) 10 MG tablet Take 10 mg by mouth daily.    . Cholecalciferol (GNP VITAMIN D MAXIMUM STRENGTH) 50 MCG (2000 UT) TABS Take 2,000 Units by mouth daily.    . clonazePAM (KLONOPIN) 0.5 MG tablet At bedtime prn, use 1/2 tab and if needed add second half. 30 tablet 5  . fluticasone (FLONASE) 50 MCG/ACT nasal spray Place 1 spray into both nostrils daily as needed for rhinitis (drainage issues.).     Marland Kitchen leflunomide (ARAVA) 20 MG tablet Take 20 mg by mouth daily.    . montelukast (SINGULAIR) 10 MG tablet Take 10 mg by mouth every evening.    Marland Kitchen SYNJARDY XR 09-998 MG TB24 Take 2 tablets by mouth daily.     Marland Kitchen triamterene-hydrochlorothiazide (MAXZIDE-25) 37.5-25 MG tablet Take 1 tablet by mouth daily.     Marland Kitchen leflunomide (ARAVA) 10 MG tablet Take  20 mg by mouth daily.     . Melatonin 3 MG CAPS Take 1 capsule (3 mg total) by mouth at bedtime. (Patient not taking: Reported on 02/02/2019) 30 capsule 0   No facility-administered medications prior to visit.     PAST MEDICAL HISTORY: Past Medical History:  Diagnosis Date  . Arthritis    back  . Cancer [facility])    prostate  . Diabetes mellitus without complication ([place])    on meds  . Diverticulosis   . Hypercholesteremia   . Hypertension   . Meatal stenosis   . Osteoarthritis   . Sinus drainage   . Tinea pedis     PAST SURGICAL HISTORY: Past Surgical History:  Procedure Laterality Date  . BACK SURGERY  1984, 2013   lumbar  . DG DILATION URETERS  2003   . EYE SURGERY Bilateral 1999   Lasik  . LEFT HEART CATH AND CORONARY ANGIOGRAPHY N/A 05/26/2018   Procedure: LEFT HEART CATH AND CORONARY ANGIOGRAPHY;  Surgeon: Nigel Mormon, MD;  Location: [facility];  Service: Cardiovascular;  Laterality: N/A;  . POSTERIOR LUMBAR FUSION 4 LEVEL N/A 06/01/2014   Procedure: LUMBAR THREE TO FOUR, LUMBAR FOUR TO FIVE LAMINECTOMY,  RIGHT LUMBAR FUSION AT LUMBAR FOUR TO FIVE;  Surgeon: Floyce Stakes, MD;  Location: [facility];  Service: Neurosurgery;  Laterality: N/A;  POSSIBLE L2-3 L3-4 L4-5 L5-S1 POSTERIOR LUMBAR INTERBODY FUSION  . PROSTATECTOMY  2002    FAMILY HISTORY: Family History  Problem Relation Age of Onset  . Heart failure Mother   . CAD Mother   . CAD Sister        Brain tumor  . Stroke Sister   . Cancer Father   . Stroke Sister   . Coronary artery disease Sister   . Hypertension Son   . Diabetes Son   . Obesity Son   . Sleep apnea Son   . Heart murmur Son        No major structural issues noted.    SOCIAL HISTORY: Social History   Socioeconomic History  . Marital status: Married    Spouse name: Not on file  . Number of children: 2  . Years of education: Not on file  . Highest education level: Not on file  Occupational History  . Not on file  Social Needs  . Financial resource strain: Not on file  . Food insecurity    Worry: Not on file    Inability: Not on file  . Transportation needs    Medical: Not on file    Non-medical: Not on file  Tobacco Use  . Smoking status: Former Smoker    Packs/day: 1.50    Years: 20.00    Pack years: 30.00    Types: Cigarettes    Quit date: 02/01/1991    Years since quitting: 28.0  . Smokeless tobacco: Never Used  Substance and Sexual Activity  . Alcohol use: Yes    Comment: occasionally  . Drug use: No  . Sexual activity: Not on file  Lifestyle  . Physical activity    Days per week: Not on file    Minutes per session: Not on file  . Stress: Not on file   Relationships  . Social Herbalist on phone: Not on file    Gets together: Not on file    Attends religious service: Not on file    Active member of club or organization: Not on file  Attends meetings of clubs or organizations: Not on file    Relationship status: Not on file  . Intimate partner violence    Fear of current or ex partner: Not on file    Emotionally abused: Not on file    Physically abused: Not on file    Forced sexual activity: Not on file  Other Topics Concern  . Not on file  Social History Narrative  . Not on file      PHYSICAL EXAM  Vitals:   02/02/19 1040  BP: 139/86  Pulse: (!) 111  Temp: (!) 96.8 F (36 C)  TempSrc: Oral  Weight: 181 lb 12.8 oz (82.5 kg)  Height: 5' 10.5" (1.791 m)   Body mass index is 25.72 kg/m.  Generalized: Well developed, in no acute distress   Neurological examination  Mentation: Alert oriented to time, place, history taking. Follows all commands speech and language fluent Cranial nerve II-XII: Pupils were equal round reactive to light. Extraocular movements were full, visual field were full on confrontational test. Head turning and shoulder shrug  were normal and symmetric. Motor: The motor testing reveals 5 over 5 strength of all 4 extremities. Good symmetric motor tone is noted throughout.  Sensory: Sensory testing is intact to soft touch on all 4 extremities. No evidence of extinction is noted.  Coordination: Cerebellar testing reveals good finger-nose-finger and heel-to-shin bilaterally.  Gait and station: Gait is normal.  Reflexes: Deep tendon reflexes are symmetric and normal bilaterally.   DIAGNOSTIC DATA (LABS, IMAGING, TESTING) - I reviewed patient records, labs, notes, testing and imaging myself where available.  Lab Results  Component Value Date   WBC 6.9 05/24/2014   HGB 14.7 05/24/2014   HCT 45.5 05/24/2014   MCV 91.2 05/24/2014   PLT 207 05/24/2014      Component Value Date/Time   NA  139 05/24/2014 1221   K 4.0 05/24/2014 1221   CL 101 05/24/2014 1221   CO2 28 05/24/2014 1221   GLUCOSE 103 (H) 05/24/2014 1221   BUN 13 05/24/2014 1221   CREATININE 1.19 05/24/2014 1221   CALCIUM 10.1 05/24/2014 1221   GFRNONAA 62 (L) 05/24/2014 1221   GFRAA 72 (L) 05/24/2014 1221      ASSESSMENT AND PLAN 73 y.o. year old male  has a past medical history of Arthritis, Cancer ([place]), Diabetes mellitus without complication ([place]), Diverticulosis, Hypercholesteremia, Hypertension, Meatal stenosis, Osteoarthritis, Sinus drainage, and Tinea pedis. here with:  1.  REM sleep behavior disorder 2.  Essential tremor  The patient will continue on Klonopin and melatonin.  We did discuss his tremor.  Advised that we could start him on a low-dose of primidone however he deferred for now.  Advised that if his symptoms worsen or he develops new symptoms he should let us know.  He will follow-up in 1 year or sooner if needed.      Ward Givens, MSN, NP-C 02/02/2019, 11:27 AM [facility] [address], [place], [place] [address] [phone]

## 2019-02-03 DIAGNOSIS — M25462 Effusion, left knee: Secondary | ICD-10-CM | POA: Diagnosis not present

## 2019-02-03 DIAGNOSIS — M79643 Pain in unspecified hand: Secondary | ICD-10-CM | POA: Diagnosis not present

## 2019-02-03 DIAGNOSIS — M25461 Effusion, right knee: Secondary | ICD-10-CM | POA: Diagnosis not present

## 2019-02-03 DIAGNOSIS — M0609 Rheumatoid arthritis without rheumatoid factor, multiple sites: Secondary | ICD-10-CM | POA: Diagnosis not present

## 2019-02-03 DIAGNOSIS — M1990 Unspecified osteoarthritis, unspecified site: Secondary | ICD-10-CM | POA: Diagnosis not present

## 2019-02-03 DIAGNOSIS — M1129 Other chondrocalcinosis, multiple sites: Secondary | ICD-10-CM | POA: Diagnosis not present

## 2019-02-03 DIAGNOSIS — M25562 Pain in left knee: Secondary | ICD-10-CM | POA: Diagnosis not present

## 2019-02-03 DIAGNOSIS — Z79899 Other long term (current) drug therapy: Secondary | ICD-10-CM | POA: Diagnosis not present

## 2019-02-03 DIAGNOSIS — R768 Other specified abnormal immunological findings in serum: Secondary | ICD-10-CM | POA: Diagnosis not present

## 2019-02-03 DIAGNOSIS — M25561 Pain in right knee: Secondary | ICD-10-CM | POA: Diagnosis not present

## 2019-02-10 DIAGNOSIS — Z961 Presence of intraocular lens: Secondary | ICD-10-CM | POA: Diagnosis not present

## 2019-02-10 DIAGNOSIS — E119 Type 2 diabetes mellitus without complications: Secondary | ICD-10-CM | POA: Diagnosis not present

## 2019-04-20 DIAGNOSIS — E118 Type 2 diabetes mellitus with unspecified complications: Secondary | ICD-10-CM | POA: Diagnosis not present

## 2019-04-20 DIAGNOSIS — E119 Type 2 diabetes mellitus without complications: Secondary | ICD-10-CM | POA: Diagnosis not present

## 2019-04-20 DIAGNOSIS — Z125 Encounter for screening for malignant neoplasm of prostate: Secondary | ICD-10-CM | POA: Diagnosis not present

## 2019-04-20 DIAGNOSIS — I10 Essential (primary) hypertension: Secondary | ICD-10-CM | POA: Diagnosis not present

## 2019-04-26 DIAGNOSIS — E118 Type 2 diabetes mellitus with unspecified complications: Secondary | ICD-10-CM | POA: Diagnosis not present

## 2019-04-26 DIAGNOSIS — Z0000 Encounter for general adult medical examination without abnormal findings: Secondary | ICD-10-CM | POA: Diagnosis not present

## 2019-04-26 DIAGNOSIS — Z7982 Long term (current) use of aspirin: Secondary | ICD-10-CM | POA: Diagnosis not present

## 2019-04-26 DIAGNOSIS — I10 Essential (primary) hypertension: Secondary | ICD-10-CM | POA: Diagnosis not present

## 2019-04-26 DIAGNOSIS — I714 Abdominal aortic aneurysm, without rupture: Secondary | ICD-10-CM | POA: Diagnosis not present

## 2019-04-26 DIAGNOSIS — E7800 Pure hypercholesterolemia, unspecified: Secondary | ICD-10-CM | POA: Diagnosis not present

## 2019-04-26 DIAGNOSIS — J309 Allergic rhinitis, unspecified: Secondary | ICD-10-CM | POA: Diagnosis not present

## 2019-04-26 DIAGNOSIS — M0609 Rheumatoid arthritis without rheumatoid factor, multiple sites: Secondary | ICD-10-CM | POA: Diagnosis not present

## 2019-04-26 DIAGNOSIS — R9430 Abnormal result of cardiovascular function study, unspecified: Secondary | ICD-10-CM | POA: Diagnosis not present

## 2019-04-26 DIAGNOSIS — E039 Hypothyroidism, unspecified: Secondary | ICD-10-CM | POA: Diagnosis not present

## 2019-04-26 DIAGNOSIS — Z8546 Personal history of malignant neoplasm of prostate: Secondary | ICD-10-CM | POA: Diagnosis not present

## 2019-04-26 DIAGNOSIS — G250 Essential tremor: Secondary | ICD-10-CM | POA: Diagnosis not present

## 2019-05-26 ENCOUNTER — Other Ambulatory Visit: Payer: Self-pay

## 2019-05-26 DIAGNOSIS — Z20822 Contact with and (suspected) exposure to covid-19: Secondary | ICD-10-CM

## 2019-05-27 DIAGNOSIS — M1990 Unspecified osteoarthritis, unspecified site: Secondary | ICD-10-CM | POA: Diagnosis not present

## 2019-05-27 DIAGNOSIS — Z79899 Other long term (current) drug therapy: Secondary | ICD-10-CM | POA: Diagnosis not present

## 2019-05-28 LAB — NOVEL CORONAVIRUS, NAA: SARS-CoV-2, NAA: NOT DETECTED

## 2019-06-04 DIAGNOSIS — R1031 Right lower quadrant pain: Secondary | ICD-10-CM | POA: Diagnosis not present

## 2019-06-10 ENCOUNTER — Ambulatory Visit: Payer: Medicare Other | Attending: Internal Medicine

## 2019-06-10 HISTORY — PX: COLONOSCOPY: SHX174

## 2019-06-10 NOTE — Progress Notes (Signed)
   Covid-19 Vaccination Clinic  Name:  Arlis Gayler    MRN: [id number] DOB: 02/28/1946  06/10/2019  Mr. Forys was observed post Covid-19 immunization for 15 minutes without incidence. He was provided with Vaccine Information Sheet and instruction to access the V-Safe system.   Mr. Rodelo was instructed to call 911 with any severe reactions post vaccine: Marland Kitchen Difficulty breathing  . Swelling of your face and throat  . A fast heartbeat  . A bad rash all over your body  . Dizziness and weakness

## 2019-06-14 DIAGNOSIS — Z1159 Encounter for screening for other viral diseases: Secondary | ICD-10-CM | POA: Diagnosis not present

## 2019-06-17 DIAGNOSIS — K640 First degree hemorrhoids: Secondary | ICD-10-CM | POA: Diagnosis not present

## 2019-06-17 DIAGNOSIS — K635 Polyp of colon: Secondary | ICD-10-CM | POA: Diagnosis not present

## 2019-06-17 DIAGNOSIS — R1031 Right lower quadrant pain: Secondary | ICD-10-CM | POA: Diagnosis not present

## 2019-06-17 DIAGNOSIS — K5730 Diverticulosis of large intestine without perforation or abscess without bleeding: Secondary | ICD-10-CM | POA: Diagnosis not present

## 2019-06-22 DIAGNOSIS — K635 Polyp of colon: Secondary | ICD-10-CM | POA: Diagnosis not present

## 2019-06-24 ENCOUNTER — Ambulatory Visit (INDEPENDENT_AMBULATORY_CARE_PROVIDER_SITE_OTHER): Payer: Medicare Other | Admitting: Cardiology

## 2019-06-24 ENCOUNTER — Encounter: Payer: Self-pay | Admitting: Cardiology

## 2019-06-24 ENCOUNTER — Other Ambulatory Visit: Payer: Self-pay

## 2019-06-24 VITALS — BP 144/77 | HR 100 | Temp 97.5°F | Ht 70.0 in | Wt 179.9 lb

## 2019-06-24 DIAGNOSIS — I714 Abdominal aortic aneurysm, without rupture, unspecified: Secondary | ICD-10-CM

## 2019-06-24 DIAGNOSIS — I429 Cardiomyopathy, unspecified: Secondary | ICD-10-CM

## 2019-06-24 DIAGNOSIS — I10 Essential (primary) hypertension: Secondary | ICD-10-CM

## 2019-06-24 NOTE — Progress Notes (Signed)
Patient is here for follow up visit.  Subjective:   Tyler Hayes, male    DOB: April 03, 1946, 73 y.o.   MRN: [id number]   Chief Complaint  Patient presents with  . Hypertension  . Follow-up    6 month     HPI  74 year old African-American male with uncontrolled hypertension, type II diabetes mellitus, former smoker, new diagnosis of LV noncompaction  Patient was see by clinical molecular geneticist Dr. Lattie Corns. Her note below.  In summary, Tyler Hayes presents with LVNC at age 30 and is asymptomatic. The fact that his mother had CHF at a young age and died soon after is concerning. As Tyler Hayes is asymptomatic and his Tyler Hayes:C ratio is below the diagnostic cut-off for LVNC, it is likely that he has a benign anatomic variant and hence, genetic testing is not warranted. Cross verbalized understanding of this.  Patient is doing well, denies chest pain, shortness of breath, palpitations, leg edema, orthopnea, PND, TIA/syncope. In fall, he had noticed retrosternal burning associated with phlegm, which has now improved. He endorses not walking regularly.    Current Outpatient Medications on File Prior to Visit  Medication Sig Dispense Refill  . amLODipine (NORVASC) 5 MG tablet Take 1 tablet (5 mg total) by mouth daily. 30 tablet 5  . aspirin EC 81 MG tablet Take 1 tablet (81 mg total) by mouth daily. 90 tablet 3  . atorvastatin (LIPITOR) 10 MG tablet Take 10 mg by mouth daily.    . Cholecalciferol (GNP VITAMIN D MAXIMUM STRENGTH) 50 MCG (2000 UT) TABS Take 2,000 Units by mouth daily.    . clonazePAM (KLONOPIN) 0.5 MG tablet At bedtime prn, use 1/2 tab and if needed add second half. 30 tablet 5  . fluticasone (FLONASE) 50 MCG/ACT nasal spray Place 1 spray into both nostrils daily as needed for rhinitis (drainage issues.).     Marland Kitchen leflunomide (ARAVA) 20 MG tablet Take 20 mg by mouth daily.    . Melatonin 3 MG CAPS Take 1 capsule (3 mg total) by mouth at bedtime. 30 capsule 0  . montelukast  (SINGULAIR) 10 MG tablet Take 10 mg by mouth every evening.    Marland Kitchen SYNJARDY XR 09-998 MG TB24 Take 2 tablets by mouth daily.     Marland Kitchen triamterene-hydrochlorothiazide (MAXZIDE-25) 37.5-25 MG tablet Take 1 tablet by mouth daily.      No current facility-administered medications on file prior to visit.    Cardiovascular studies:  EKG 06/24/2019: Sinus rhythm 73 bpm. Anterolateral T wave inversion, unchanged from prior EKG. Correction: New since last EKG in 12/2018. LBBB no longer present.  Coronary angiography 05/26/2018: LM: Normal LAD: Normal LCx: Normal Ramus intermedius: Normal RCA: Mid focal 20% disease LVEDP normal  Conclusion: Minimal nonobstructive coronary artery disease Normal LVEDP  Cardiac MRI 06/18/2018: 1. Mild LVE with diffuse hypokinesis and abnormal septal motion EF 42% 2. Mild ventricular non compaction ratio using posterior wall thickness 2:1 3.  Normal RV size and function  Echocardiogram 05/04/2018: Left ventricle cavity is small. Mild concentric hypertrophy of the left ventricle. 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 There is mild hypokinesis of the the anterior, anterolateral walls. Mild decrease in LV systolic function. Doppler evidence of grade I (impaired) diastolic dysfunction, normal LAP. Visual EF is 40-45%.  Trace mitral regurgitation. Trace tricuspid regurgitation.  Lexiscan Nulcear stress test 05/02/2018: 1. Lexiscan stress with low level exercise was performed. Exercise capacity was not assessed. No stress symptoms reported. Blood pressure was normal.  The resting electrocardiogram demonstrated normal sinus rhythm, normal resting conduction and no resting arrhythmias.  Anteroseptal T wave inversions seen. Stress EKG is non diagnostic for ischemia as it is a pharmacologic stress. However, heart rate increased to 87% of maximum predicted heart rate on level exercise. Stress electrocardiogram showed The stress electrocardiogram  showed sinus tachycardia with ventricular bigeminy and persistent anteroseptal T wave inversion. This progressed to LBBB, that persisted several minutes into recovery.  2. The overall quality of the study is good.  Left ventricular cavity is noted to be enlarged on the rest and stress studies.  Gated SPECT imaging demonstrates anteroseptal dyskinesis along with global diffuse hypokinesis. LVEF calculated at 37%. SPECT images show medium sized, mild intensity, perfusion defect in apical to basal anteroseptal, more prominent on rest images. While these perfusion defects could be related to differential septal perfusion, ischemia cannot be excluded.  3. High risk study. Clinical correlation recommended due to possibility of differential septal perfusion during exercise induced left bundle branch block.  Abdominal aortic duplex 05/04/2018: Severe calcific plaque throughout the abdominal aorta.  Normal flow velocities noted in aorta and iliac vessels.  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  Recheck in 2 years.    Review of Systems  Cardiovascular: Negative for chest pain, dyspnea on exertion, leg swelling, palpitations and syncope.       Objective:    Vitals:   06/24/19 1009  BP: (!) 144/77  Pulse: 100  Temp: (!) 97.5 F (36.4 C)  SpO2: 95%     Physical Exam  Constitutional: He appears well-developed and well-nourished.  Neck: No JVD present.  Cardiovascular: Normal rate, regular rhythm, normal heart sounds and intact distal pulses.  No murmur heard. Pulmonary/Chest: Effort normal and breath sounds normal. He has no wheezes. He has no rales.  Musculoskeletal:        General: No edema.  Nursing note and vitals reviewed.       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   Abnormal EKG: Anatomical variant with trabeculations, but does not meet diagnostic criteria for LVNC. EF 42% without any heart failure signs/symptoms. Reasonable not to use anticoagulation. On absence of contraindication, recommend Aspirin 81 mg. He had two benign polyps removed on colonoscopy recently, but no bleeding noted.   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 Repeat ultrasound in 04/2020 Continue statin. Start Aspirin 81 mg daily.   Hypertension: Fairly well controlled.  Type 2 DM: Continue current therapy. Managed by PCP.  F?u in 1 year after aorta duplex.  Nigel Mormon, MD [facility] Pager: [phone] Office: [phone] If no answer Cell [phone]

## 2019-07-01 ENCOUNTER — Ambulatory Visit: Payer: Medicare Other | Attending: Internal Medicine

## 2019-07-01 DIAGNOSIS — Z23 Encounter for immunization: Secondary | ICD-10-CM

## 2019-07-01 NOTE — Progress Notes (Signed)
   Covid-19 Vaccination Clinic  Name:  Caylum Korab    MRN: [id number] DOB: 02-23-1946  07/01/2019  Mr. Seba was observed post Covid-19 immunization for 15 minutes without incidence. He was provided with Vaccine Information Sheet and instruction to access the V-Safe system.   Mr. Eichholz was instructed to call 911 with any severe reactions post vaccine: Marland Kitchen Difficulty breathing  . Swelling of your face and throat  . A fast heartbeat  . A bad rash all over your body  . Dizziness and weakness    Immunizations Administered    Name Date Dose VIS Date Route   Pfizer COVID-19 Vaccine 07/01/2019  2:13 PM 0.3 mL 04/30/2019 Intramuscular   Manufacturer: [place]   Lot: [id number]   [place]: [id number]

## 2019-07-07 ENCOUNTER — Ambulatory Visit: Payer: Medicare Other

## 2019-07-13 ENCOUNTER — Other Ambulatory Visit: Payer: Self-pay | Admitting: Cardiology

## 2019-07-13 DIAGNOSIS — I10 Essential (primary) hypertension: Secondary | ICD-10-CM

## 2019-08-02 DIAGNOSIS — E7800 Pure hypercholesterolemia, unspecified: Secondary | ICD-10-CM | POA: Diagnosis not present

## 2019-08-02 DIAGNOSIS — E118 Type 2 diabetes mellitus with unspecified complications: Secondary | ICD-10-CM | POA: Diagnosis not present

## 2019-08-02 DIAGNOSIS — I10 Essential (primary) hypertension: Secondary | ICD-10-CM | POA: Diagnosis not present

## 2019-08-03 DIAGNOSIS — Z79899 Other long term (current) drug therapy: Secondary | ICD-10-CM | POA: Diagnosis not present

## 2019-08-03 DIAGNOSIS — M79646 Pain in unspecified finger(s): Secondary | ICD-10-CM | POA: Diagnosis not present

## 2019-08-03 DIAGNOSIS — M0609 Rheumatoid arthritis without rheumatoid factor, multiple sites: Secondary | ICD-10-CM | POA: Diagnosis not present

## 2019-08-03 DIAGNOSIS — M1990 Unspecified osteoarthritis, unspecified site: Secondary | ICD-10-CM | POA: Diagnosis not present

## 2019-08-03 DIAGNOSIS — M7989 Other specified soft tissue disorders: Secondary | ICD-10-CM | POA: Diagnosis not present

## 2019-08-03 DIAGNOSIS — R768 Other specified abnormal immunological findings in serum: Secondary | ICD-10-CM | POA: Diagnosis not present

## 2019-08-05 DIAGNOSIS — E7800 Pure hypercholesterolemia, unspecified: Secondary | ICD-10-CM | POA: Diagnosis not present

## 2019-08-05 DIAGNOSIS — I10 Essential (primary) hypertension: Secondary | ICD-10-CM | POA: Diagnosis not present

## 2019-08-05 DIAGNOSIS — E118 Type 2 diabetes mellitus with unspecified complications: Secondary | ICD-10-CM | POA: Diagnosis not present

## 2019-08-17 DIAGNOSIS — I10 Essential (primary) hypertension: Secondary | ICD-10-CM | POA: Diagnosis not present

## 2019-08-17 DIAGNOSIS — M1990 Unspecified osteoarthritis, unspecified site: Secondary | ICD-10-CM | POA: Diagnosis not present

## 2019-09-23 DIAGNOSIS — R768 Other specified abnormal immunological findings in serum: Secondary | ICD-10-CM | POA: Diagnosis not present

## 2019-09-23 DIAGNOSIS — M79646 Pain in unspecified finger(s): Secondary | ICD-10-CM | POA: Diagnosis not present

## 2019-09-23 DIAGNOSIS — Z79899 Other long term (current) drug therapy: Secondary | ICD-10-CM | POA: Diagnosis not present

## 2019-09-23 DIAGNOSIS — M1990 Unspecified osteoarthritis, unspecified site: Secondary | ICD-10-CM | POA: Diagnosis not present

## 2019-09-23 DIAGNOSIS — M0609 Rheumatoid arthritis without rheumatoid factor, multiple sites: Secondary | ICD-10-CM | POA: Diagnosis not present

## 2019-09-23 DIAGNOSIS — M7989 Other specified soft tissue disorders: Secondary | ICD-10-CM | POA: Diagnosis not present

## 2019-10-08 DIAGNOSIS — N183 Chronic kidney disease, stage 3 unspecified: Secondary | ICD-10-CM | POA: Diagnosis not present

## 2019-10-08 DIAGNOSIS — E118 Type 2 diabetes mellitus with unspecified complications: Secondary | ICD-10-CM | POA: Diagnosis not present

## 2019-12-01 ENCOUNTER — Other Ambulatory Visit: Payer: Self-pay | Admitting: Cardiology

## 2019-12-01 DIAGNOSIS — I10 Essential (primary) hypertension: Secondary | ICD-10-CM

## 2019-12-14 DIAGNOSIS — E7800 Pure hypercholesterolemia, unspecified: Secondary | ICD-10-CM | POA: Diagnosis not present

## 2019-12-14 DIAGNOSIS — E118 Type 2 diabetes mellitus with unspecified complications: Secondary | ICD-10-CM | POA: Diagnosis not present

## 2019-12-14 DIAGNOSIS — I10 Essential (primary) hypertension: Secondary | ICD-10-CM | POA: Diagnosis not present

## 2019-12-16 DIAGNOSIS — I10 Essential (primary) hypertension: Secondary | ICD-10-CM | POA: Diagnosis not present

## 2019-12-16 DIAGNOSIS — E7800 Pure hypercholesterolemia, unspecified: Secondary | ICD-10-CM | POA: Diagnosis not present

## 2019-12-16 DIAGNOSIS — E118 Type 2 diabetes mellitus with unspecified complications: Secondary | ICD-10-CM | POA: Diagnosis not present

## 2019-12-23 DIAGNOSIS — M0609 Rheumatoid arthritis without rheumatoid factor, multiple sites: Secondary | ICD-10-CM | POA: Diagnosis not present

## 2019-12-23 DIAGNOSIS — M1990 Unspecified osteoarthritis, unspecified site: Secondary | ICD-10-CM | POA: Diagnosis not present

## 2019-12-23 DIAGNOSIS — Z79899 Other long term (current) drug therapy: Secondary | ICD-10-CM | POA: Diagnosis not present

## 2019-12-23 DIAGNOSIS — R768 Other specified abnormal immunological findings in serum: Secondary | ICD-10-CM | POA: Diagnosis not present

## 2019-12-27 DIAGNOSIS — I714 Abdominal aortic aneurysm, without rupture: Secondary | ICD-10-CM | POA: Diagnosis not present

## 2019-12-27 DIAGNOSIS — E1122 Type 2 diabetes mellitus with diabetic chronic kidney disease: Secondary | ICD-10-CM | POA: Diagnosis not present

## 2019-12-27 DIAGNOSIS — E785 Hyperlipidemia, unspecified: Secondary | ICD-10-CM | POA: Diagnosis not present

## 2019-12-27 DIAGNOSIS — Z8546 Personal history of malignant neoplasm of prostate: Secondary | ICD-10-CM | POA: Diagnosis not present

## 2019-12-27 DIAGNOSIS — N1831 Chronic kidney disease, stage 3a: Secondary | ICD-10-CM | POA: Diagnosis not present

## 2019-12-27 DIAGNOSIS — E038 Other specified hypothyroidism: Secondary | ICD-10-CM | POA: Diagnosis not present

## 2019-12-27 DIAGNOSIS — I129 Hypertensive chronic kidney disease with stage 1 through stage 4 chronic kidney disease, or unspecified chronic kidney disease: Secondary | ICD-10-CM | POA: Diagnosis not present

## 2020-01-04 ENCOUNTER — Other Ambulatory Visit: Payer: Self-pay | Admitting: Internal Medicine

## 2020-01-04 DIAGNOSIS — N1831 Chronic kidney disease, stage 3a: Secondary | ICD-10-CM

## 2020-01-07 ENCOUNTER — Ambulatory Visit
Admission: RE | Admit: 2020-01-07 | Discharge: 2020-01-07 | Disposition: A | Discharge status: Home / Self Care | Payer: Medicare Other | Source: Ambulatory Visit | Attending: Internal Medicine | Admitting: Internal Medicine

## 2020-01-07 DIAGNOSIS — N183 Chronic kidney disease, stage 3 unspecified: Secondary | ICD-10-CM | POA: Diagnosis not present

## 2020-01-07 DIAGNOSIS — N1831 Chronic kidney disease, stage 3a: Secondary | ICD-10-CM

## 2020-01-18 ENCOUNTER — Ambulatory Visit: Payer: Self-pay | Attending: Critical Care Medicine

## 2020-01-18 DIAGNOSIS — Z23 Encounter for immunization: Secondary | ICD-10-CM

## 2020-01-18 NOTE — Progress Notes (Signed)
   Covid-19 Vaccination Clinic  Name:  Ayaan Shutes    MRN: [id number] DOB: Jun 19, 1945  01/18/2020  Mr. Keltz was observed post Covid-19 immunization for 15 minutes without incident. He was provided with Vaccine Information Sheet and instruction to access the V-Safe system.   Mr. Stockinger was instructed to call 911 with any severe reactions post vaccine: Marland Kitchen Difficulty breathing  . Swelling of face and throat  . A fast heartbeat  . A bad rash all over body  . Dizziness and weakness

## 2020-01-24 DIAGNOSIS — G9389 Other specified disorders of brain: Secondary | ICD-10-CM | POA: Diagnosis not present

## 2020-01-24 DIAGNOSIS — J189 Pneumonia, unspecified organism: Secondary | ICD-10-CM | POA: Diagnosis not present

## 2020-01-24 DIAGNOSIS — R509 Fever, unspecified: Secondary | ICD-10-CM | POA: Diagnosis not present

## 2020-01-24 DIAGNOSIS — R262 Difficulty in walking, not elsewhere classified: Secondary | ICD-10-CM | POA: Diagnosis not present

## 2020-01-24 DIAGNOSIS — R531 Weakness: Secondary | ICD-10-CM | POA: Diagnosis not present

## 2020-01-24 DIAGNOSIS — R000 Tachycardia, unspecified: Secondary | ICD-10-CM | POA: Diagnosis not present

## 2020-01-24 DIAGNOSIS — R918 Other nonspecific abnormal finding of lung field: Secondary | ICD-10-CM | POA: Diagnosis not present

## 2020-01-24 DIAGNOSIS — Z20822 Contact with and (suspected) exposure to covid-19: Secondary | ICD-10-CM | POA: Diagnosis not present

## 2020-01-24 DIAGNOSIS — G912 (Idiopathic) normal pressure hydrocephalus: Secondary | ICD-10-CM | POA: Diagnosis not present

## 2020-01-24 DIAGNOSIS — G250 Essential tremor: Secondary | ICD-10-CM | POA: Diagnosis not present

## 2020-01-24 DIAGNOSIS — G20 Parkinson's disease: Secondary | ICD-10-CM | POA: Diagnosis not present

## 2020-01-24 DIAGNOSIS — R296 Repeated falls: Secondary | ICD-10-CM | POA: Diagnosis not present

## 2020-01-24 DIAGNOSIS — R269 Unspecified abnormalities of gait and mobility: Secondary | ICD-10-CM | POA: Diagnosis not present

## 2020-01-24 DIAGNOSIS — R0902 Hypoxemia: Secondary | ICD-10-CM | POA: Diagnosis not present

## 2020-01-24 DIAGNOSIS — R270 Ataxia, unspecified: Secondary | ICD-10-CM | POA: Diagnosis not present

## 2020-01-24 DIAGNOSIS — D849 Immunodeficiency, unspecified: Secondary | ICD-10-CM | POA: Diagnosis not present

## 2020-01-25 DIAGNOSIS — I10 Essential (primary) hypertension: Secondary | ICD-10-CM | POA: Diagnosis not present

## 2020-01-25 DIAGNOSIS — D849 Immunodeficiency, unspecified: Secondary | ICD-10-CM | POA: Diagnosis present

## 2020-01-25 DIAGNOSIS — R2689 Other abnormalities of gait and mobility: Secondary | ICD-10-CM | POA: Diagnosis not present

## 2020-01-25 DIAGNOSIS — R2681 Unsteadiness on feet: Secondary | ICD-10-CM | POA: Diagnosis not present

## 2020-01-25 DIAGNOSIS — J189 Pneumonia, unspecified organism: Secondary | ICD-10-CM | POA: Diagnosis not present

## 2020-01-25 DIAGNOSIS — Z87891 Personal history of nicotine dependence: Secondary | ICD-10-CM | POA: Diagnosis not present

## 2020-01-25 DIAGNOSIS — M1990 Unspecified osteoarthritis, unspecified site: Secondary | ICD-10-CM | POA: Insufficient documentation

## 2020-01-25 DIAGNOSIS — G20 Parkinson's disease: Secondary | ICD-10-CM | POA: Diagnosis not present

## 2020-01-25 DIAGNOSIS — R269 Unspecified abnormalities of gait and mobility: Secondary | ICD-10-CM | POA: Diagnosis not present

## 2020-01-25 DIAGNOSIS — G9389 Other specified disorders of brain: Secondary | ICD-10-CM | POA: Diagnosis present

## 2020-01-25 DIAGNOSIS — Z8546 Personal history of malignant neoplasm of prostate: Secondary | ICD-10-CM | POA: Diagnosis not present

## 2020-01-25 DIAGNOSIS — E782 Mixed hyperlipidemia: Secondary | ICD-10-CM | POA: Insufficient documentation

## 2020-01-25 DIAGNOSIS — R918 Other nonspecific abnormal finding of lung field: Secondary | ICD-10-CM | POA: Diagnosis not present

## 2020-01-25 DIAGNOSIS — Z7984 Long term (current) use of oral hypoglycemic drugs: Secondary | ICD-10-CM | POA: Diagnosis not present

## 2020-01-25 DIAGNOSIS — G912 (Idiopathic) normal pressure hydrocephalus: Secondary | ICD-10-CM | POA: Diagnosis not present

## 2020-01-25 DIAGNOSIS — E7800 Pure hypercholesterolemia, unspecified: Secondary | ICD-10-CM | POA: Diagnosis present

## 2020-01-25 DIAGNOSIS — R509 Fever, unspecified: Secondary | ICD-10-CM | POA: Diagnosis not present

## 2020-01-25 DIAGNOSIS — R262 Difficulty in walking, not elsewhere classified: Secondary | ICD-10-CM | POA: Diagnosis present

## 2020-01-25 DIAGNOSIS — Z20822 Contact with and (suspected) exposure to covid-19: Secondary | ICD-10-CM | POA: Diagnosis not present

## 2020-01-25 DIAGNOSIS — Z7982 Long term (current) use of aspirin: Secondary | ICD-10-CM | POA: Diagnosis not present

## 2020-01-25 DIAGNOSIS — R5381 Other malaise: Secondary | ICD-10-CM | POA: Diagnosis present

## 2020-01-25 DIAGNOSIS — E785 Hyperlipidemia, unspecified: Secondary | ICD-10-CM | POA: Diagnosis not present

## 2020-01-25 DIAGNOSIS — E119 Type 2 diabetes mellitus without complications: Secondary | ICD-10-CM | POA: Diagnosis present

## 2020-02-03 ENCOUNTER — Other Ambulatory Visit: Payer: Self-pay

## 2020-02-03 ENCOUNTER — Ambulatory Visit (INDEPENDENT_AMBULATORY_CARE_PROVIDER_SITE_OTHER): Payer: Medicare Other | Admitting: Adult Health

## 2020-02-03 ENCOUNTER — Encounter: Payer: Self-pay | Admitting: Adult Health

## 2020-02-03 VITALS — BP 121/73 | HR 97 | Ht 70.0 in | Wt 173.4 lb

## 2020-02-03 DIAGNOSIS — G250 Essential tremor: Secondary | ICD-10-CM | POA: Diagnosis not present

## 2020-02-03 DIAGNOSIS — G4752 REM sleep behavior disorder: Secondary | ICD-10-CM | POA: Diagnosis not present

## 2020-02-03 DIAGNOSIS — R269 Unspecified abnormalities of gait and mobility: Secondary | ICD-10-CM | POA: Diagnosis not present

## 2020-02-03 MED ORDER — CLONAZEPAM 0.5 MG PO TABS: ORAL_TABLET | ORAL | 5 refills | Status: DC

## 2020-02-03 MED ORDER — PRIMIDONE 50 MG PO TABS: 25.0000 mg | ORAL_TABLET | Freq: Four times a day (QID) | ORAL | 5 refills | Status: DC

## 2020-02-03 NOTE — Patient Instructions (Signed)
Your Plan:  Stop Sinemet Start primidone 25 mg at bedtime If your symptoms worsen or you develop new symptoms please let us know.    Thank you for coming to see [place] at [facility]. I hope we have been able to provide you high quality care today.  You may receive a patient satisfaction survey over the next few weeks. We would appreciate your feedback and comments so that we may continue to improve ourselves and the health of our patients.  Primidone tablets What is this medicine? PRIMIDONE (PRI mi done) is a barbiturate. This medicine is used to control seizures in certain types of epilepsy. It is not for use in absence (petit mal) seizures. This medicine may be used for other purposes; ask your health care provider or pharmacist if you have questions. COMMON BRAND NAME(S): Mysoline What should I tell my health care provider before I take this medicine? They need to know if you have any of these conditions:  kidney disease  liver disease  porphyria  suicidal thoughts, plans, or attempt; a previous suicide attempt by you or a family member  an unusual or allergic reaction to primidone, phenobarbital, other barbiturates or seizure medications, other medicines, foods, dyes, or preservatives  pregnant or trying to get pregnant  breast-feeding How should I use this medicine? Take this medicine by mouth with a glass of water. Follow the directions on the prescription label. Take your doses at regular intervals. Do not take your medicine more often than directed. Do not stop taking except on the advice of your doctor or health care professional. A special MedGuide will be given to you by the pharmacist with each prescription and refill. Be sure to read this information carefully each time. Contact your pediatrician or health care professional regarding the use of this medication in children. Special care may be needed. While this drug may be prescribed for children for  selected conditions, precautions do apply. Overdosage: If you think you have taken too much of this medicine contact a poison control center or emergency room at once. NOTE: This medicine is only for you. Do not share this medicine with others. What if I miss a dose? If you miss a dose, take it as soon as you can. If it is almost time for your next dose, take only that dose. Do not take double or extra doses. What may interact with this medicine? Do not take this medicine with any of the following medications:  voriconazole This medicine may also interact with the following medications:  cancer-treating medications  cyclosporine  disopyramide  doxycycline  male hormones, including contraceptive or birth control pills  medicines for mental depression, anxiety or other mood problems  medicines for treating HIV infection or AIDS  modafinil  prescription pain medications  quinidine  warfarin This list may not describe all possible interactions. Give your health care provider a list of all the medicines, herbs, non-prescription drugs, or dietary supplements you use. Also tell them if you smoke, drink alcohol, or use illegal drugs. Some items may interact with your medicine. What should I watch for while using this medicine? Visit your doctor or health care professional for regular checks on your progress. It may be 2 to 3 weeks before you see the full effects of this medicine. Do not suddenly stop taking this medicine, you may increase the risk of seizures. Your doctor or health care professional may want to gradually reduce the dose. Wear a medical identification bracelet or chain  to say you have epilepsy, and carry a card that lists all your medications. You may get drowsy or dizzy. Do not drive, use machinery, or do anything that needs mental alertness until you know how this medicine affects you. Do not stand or sit up quickly, especially if you are an older patient. This reduces  the risk of dizzy or fainting spells. Alcohol may interfere with the effect of this medicine. Avoid alcoholic drinks. Birth control pills may not work properly while you are taking this medicine. Talk to your doctor about using an extra method of birth control. The use of this medicine may increase the chance of suicidal thoughts or actions. Pay special attention to how you are responding while on this medicine. Any worsening of mood, or thoughts of suicide or dying should be reported to your health care professional right away. Women who become pregnant while using this medicine may enroll in the [place] Pregnancy Registry by calling [phone]. This registry collects information about the safety of antiepileptic drug use during pregnancy. This medicine may cause a decrease in vitamin D and folic acid. You should make sure that you get enough vitamins while you are taking this medicine. Discuss the foods you eat and the vitamins you take with your health care professional. What side effects may I notice from receiving this medicine? Side effects that you should report to your doctor or health care professional as soon as possible:  allergic reactions like skin rash, itching or hives, swelling of the face, lips, or tongue  blurred, double vision, or uncontrollable rolling or movements of the eyes  redness, blistering, peeling or loosening of the skin, including inside the mouth  shortness of breath or difficulty breathing  unusual excitement or restlessness, more likely in children and the elderly  unusually weak or tired  worsening of mood, thoughts or actions of suicide or dying Side effects that usually do not require medical attention (report to your doctor or health care professional if they continue or are bothersome):  clumsiness, unsteadiness, or a hang-over effect  decreased sexual ability  dizziness, drowsiness  loss of appetite  nausea or  vomiting This list may not describe all possible side effects. Call your doctor for medical advice about side effects. You may report side effects to FDA at 1-800-FDA-1088. Where should I keep my medicine? Keep out of the reach of children. This medicine may cause accidental overdose and death if it taken by other adults, children, or pets. Mix any unused medicine with a substance like cat litter or coffee grounds. Then throw the medicine away in a sealed container like a sealed bag or a coffee can with a lid. Do not use the medicine after the expiration date. Store at room temperature between 15 and 30 degrees C (59 and 86 degrees F). NOTE: This sheet is a summary. It may not cover all possible information. If you have questions about this medicine, talk to your doctor, pharmacist, or health care provider.  2020 Elsevier/Gold Standard (2016-12-18 15:21:47)

## 2020-02-03 NOTE — Progress Notes (Signed)
PATIENT: Tyler Hayes DOB: January 18, 1946  REASON FOR VISIT: follow up HISTORY FROM: patient  HISTORY OF PRESENT ILLNESS: Today 02/03/20:  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 He returns today for follow-up.  His wife is with him.  She reports that they were in [place] recently and the patient suffered a fall.  He was taken to the emergency room.  He was found to have pneumonia and started on antibiotics.  She states that while there they were concerned about possible Parkinson's disease.  The patient states that he does have a tremor that primarily occurs with action.  States that he finds it very difficult to hold a spoon, right with a pen or pencil or even buttoning a button.  He does notice a mild tremor at rest.  His wife states occasionally she can see the bottom lip tremoring as well.  The patient states he also feels that this may be related to stress and anxiety?  He states that his gait has improved slightly.  He has not had any additional falls.  Reports that prior to being diagnosed with pneumonia he did not notice any changes with his gait.  He continues to have trouble with his sleep however he does not take Klonopin consistently as he was concerned he may become addicted.  He returns today for an evaluation.  HISTORY 02/02/19:  [name] is a 74 year old male with a history of REM sleep behavior disorder.  He returns today for follow-up.  He states that he has been doing well with melatonin and Klonopin.  He states he only takes Klonopin if he is anxious during the day or if he takes a lot of naps.  He states that he notices that if he limits his naps during the day in limits his TV at bedtime his nighttime behavior improves.  He states that his wife has reported that he still talks in his sleep but him acting out his dreams has improved significantly.  He feels that his memory has remained stable.  He is able to  complete all ADLs independently.  He operates a Teacher, music without difficulty.  The patient reports that his tremor is fairly mild.  He reports that he has the most difficulty with handwriting.  He returns today for an evaluation.  REVIEW OF SYSTEMS: Out of a complete 14 system review of symptoms, the patient complains only of the following symptoms, and all other reviewed systems are negative.  See HPI  ALLERGIES: Allergies  Allergen Reactions  . Ace Inhibitors Cough    LISINOPRIL    HOME MEDICATIONS: Outpatient Medications Prior to Visit  Medication Sig Dispense Refill  . amLODipine (NORVASC) 5 MG tablet TAKE 1 TABLET BY MOUTH EVERY DAY 30 tablet 6  . aspirin EC 81 MG tablet Take 1 tablet (81 mg total) by mouth daily. 90 tablet 3  . atorvastatin (LIPITOR) 10 MG tablet Take 10 mg by mouth daily.    . carbidopa-levodopa (SINEMET IR) 25-100 MG tablet Take 1 tablet by mouth daily.    . Cholecalciferol (GNP VITAMIN D MAXIMUM STRENGTH) 50 MCG (2000 UT) TABS Take 2,000 Units by mouth daily.    . clonazePAM (KLONOPIN) 0.5 MG tablet At bedtime prn, use 1/2 tab and if needed add second half. 30 tablet 5  . fluticasone (FLONASE) 50 MCG/ACT nasal spray Place 1 spray into both nostrils daily as needed for rhinitis (drainage issues.).     Marland Kitchen  leflunomide (ARAVA) 20 MG tablet Take 20 mg by mouth daily.    . Melatonin 3 MG CAPS Take 1 capsule (3 mg total) by mouth at bedtime. 30 capsule 0  . montelukast (SINGULAIR) 10 MG tablet Take 10 mg by mouth every evening.    . sulfaSALAzine (AZULFIDINE) 500 MG tablet Take 1 tablet by mouth 2 (two) times daily.    Marland Kitchen SYNJARDY XR 09-998 MG TB24 Take 2 tablets by mouth daily.     Marland Kitchen triamterene-hydrochlorothiazide (MAXZIDE-25) 37.5-25 MG tablet Take 1 tablet by mouth daily.      No facility-administered medications prior to visit.    PAST MEDICAL HISTORY: Past Medical History:  Diagnosis Date  . Arthritis    back  . Cancer [facility])    prostate  . Diabetes  mellitus without complication ([place])    on meds  . Diverticulosis   . Hypercholesteremia   . Hypertension   . Meatal stenosis   . Osteoarthritis   . Sinus drainage   . Tinea pedis     PAST SURGICAL HISTORY: Past Surgical History:  Procedure Laterality Date  . BACK SURGERY  1984, 2013   lumbar  . COLONOSCOPY  06/10/2019   Dr. Michail Sermon  . DG DILATION URETERS  2003  . EYE SURGERY Bilateral 1999   Lasik  . LEFT HEART CATH AND CORONARY ANGIOGRAPHY N/A 05/26/2018   Procedure: LEFT HEART CATH AND CORONARY ANGIOGRAPHY;  Surgeon: Nigel Mormon, MD;  Location: [facility];  Service: Cardiovascular;  Laterality: N/A;  . POSTERIOR LUMBAR FUSION 4 LEVEL N/A 06/01/2014   Procedure: LUMBAR THREE TO FOUR, LUMBAR FOUR TO FIVE LAMINECTOMY,  RIGHT LUMBAR FUSION AT LUMBAR FOUR TO FIVE;  Surgeon: Floyce Stakes, MD;  Location: [facility];  Service: Neurosurgery;  Laterality: N/A;  POSSIBLE L2-3 L3-4 L4-5 L5-S1 POSTERIOR LUMBAR INTERBODY FUSION  . PROSTATECTOMY  2002    FAMILY HISTORY: Family History  Problem Relation Age of Onset  . Heart failure Mother   . CAD Mother   . CAD Sister        Brain tumor  . Stroke Sister   . Cancer Father   . Stroke Sister   . Coronary artery disease Sister   . Hypertension Son   . Diabetes Son   . Obesity Son   . Sleep apnea Son   . Heart murmur Son        No major structural issues noted.    SOCIAL HISTORY: Social History   Socioeconomic History  . Marital status: Married    Spouse name: Not on file  . Number of children: 3  . Years of education: Not on file  . Highest education level: Not on file  Occupational History  . Not on file  Tobacco Use  . Smoking status: Former Smoker    Packs/day: 1.50    Years: 20.00    Pack years: 30.00    Types: Cigarettes    Quit date: 02/01/1991    Years since quitting: 29.0  . Smokeless tobacco: Never Used  Vaping Use  . Vaping Use: Never used  Substance and Sexual Activity  . Alcohol  use: Yes    Comment: occasionally  . Drug use: No  . Sexual activity: Not on file  Other Topics Concern  . Not on file  Social History Narrative  . Not on file   Social Determinants of Health   Financial Resource Strain:   . Difficulty of Paying Living Expenses: Not on  file  Food Insecurity:   . Worried About Charity fundraiser in the Last Year: Not on file  . Ran Out of Food in the Last Year: Not on file  Transportation Needs:   . Lack of Transportation (Medical): Not on file  . Lack of Transportation (Non-Medical): Not on file  Physical Activity:   . Days of Exercise per Week: Not on file  . Minutes of Exercise per Session: Not on file  Stress:   . Feeling of Stress : Not on file  Social Connections:   . Frequency of Communication with Friends and Family: Not on file  . Frequency of Social Gatherings with Friends and Family: Not on file  . Attends Religious Services: Not on file  . Active Member of Clubs or Organizations: Not on file  . Attends Archivist Meetings: Not on file  . Marital Status: Not on file  Intimate Partner Violence:   . Fear of Current or Ex-Partner: Not on file  . Emotionally Abused: Not on file  . Physically Abused: Not on file  . Sexually Abused: Not on file      PHYSICAL EXAM  Vitals:   02/03/20 0920  BP: 121/73  Pulse: 97  Weight: 173 lb 6.4 oz (78.7 kg)  Height: 5\' 10"  (1.778 m)   Body mass index is 24.88 kg/m.  Generalized: Well developed, in no acute distress   Neurological examination  Mentation: Alert oriented to time, place, history taking. Follows all commands speech and language fluent Cranial nerve II-XII: Pupils were equal round reactive to light. Extraocular movements were full, visual field were full on confrontational test. Facial sensation and strength were normal. Uvula tongue midline. Head turning and shoulder shrug  were normal and symmetric. Motor: The motor testing reveals 5 over 5 strength of all 4  extremities. Good symmetric motor tone is noted throughout.  Rapid alternating movement in the upper extremities mildly impaired.  Mild impairment of finger taps bilaterally. Sensory: Sensory testing is intact to soft touch on all 4 extremities. No evidence of extinction is noted.  Coordination: Cerebellar testing reveals good finger-nose-finger and heel-to-shin bilaterally.  Action tremor noted on exam.  Spiral drawing indicated intention tremor. Gait and station: Patient is able to stand without assistance.  Slightly unsteady.  Good stride and arm swing.  Good turns. Reflexes: Deep tendon reflexes are symmetric and normal bilaterally.   DIAGNOSTIC DATA (LABS, IMAGING, TESTING) - I reviewed patient records, labs, notes, testing and imaging myself where available.  Lab Results  Component Value Date   WBC 6.9 05/24/2014   HGB 14.7 05/24/2014   HCT 45.5 05/24/2014   MCV 91.2 05/24/2014   PLT 207 05/24/2014      Component Value Date/Time   NA 139 05/24/2014 1221   K 4.0 05/24/2014 1221   CL 101 05/24/2014 1221   CO2 28 05/24/2014 1221   GLUCOSE 103 (H) 05/24/2014 1221   BUN 13 05/24/2014 1221   CREATININE 1.19 05/24/2014 1221   CALCIUM 10.1 05/24/2014 1221   GFRNONAA 62 (L) 05/24/2014 1221   GFRAA 72 (L) 05/24/2014 1221   No results found for: CHOL, HDL, LDLCALC, LDLDIRECT, TRIG, CHOLHDL No results found for: HGBA1C No results found for: VITAMINB12 No results found for: TSH    ASSESSMENT AND PLAN 74 y.o. year old male  has a past medical history of Arthritis, Cancer ([place]), Diabetes mellitus without complication ([place]), Diverticulosis, Hypercholesteremia, Hypertension, Meatal stenosis, Osteoarthritis, Sinus drainage, and Tinea pedis. here with:  1.  REM sleep behavior disorder  Encouraged patient to use Klonopin half a tablet at bedtime  2.  Parkinson's versus essential tremor  On exam is very clear that the patient has an action tremor.  There was some difficulty with rapid  alternating movement and finger taps.  And with history of REM sleep behavior perhaps indicative of Parkinson's disease?  The patient was on Sinemet prescribed when in the hospital however he is not sure that it offer him much benefit.  The patient is more concerned with his action tremor.  Reports that it does affect his quality of life.  Would like to try medication specifically for the action tremor.  Primidone 25 mg started at bedtime.  I reviewed potential side effects with the patient.  3.  Abnormality of gait  Gait is slightly unsteady however this is most likely due to recent hospitalization with pneumonia.  His recent fall seems to be related to the pneumonia?  Referral for physical therapy has been placed.  Patient will follow up in 3 months with Dr. Brett Fairy or sooner if needed   I spent 35 minutes of face-to-face and non-face-to-face time with patient.  This included previsit chart review, lab review, study review, order entry, electronic health record documentation, patient education.  Ward Givens, MSN, NP-C 02/03/2020, 9:40 AM [facility] [address] [place], [place] [address] [phone]

## 2020-02-03 NOTE — Progress Notes (Signed)
Note from [facility] found in hallway, mailed to patient.

## 2020-02-08 DIAGNOSIS — W19XXXA Unspecified fall, initial encounter: Secondary | ICD-10-CM | POA: Diagnosis not present

## 2020-02-08 DIAGNOSIS — R251 Tremor, unspecified: Secondary | ICD-10-CM | POA: Diagnosis not present

## 2020-02-08 DIAGNOSIS — J189 Pneumonia, unspecified organism: Secondary | ICD-10-CM | POA: Diagnosis not present

## 2020-02-14 DIAGNOSIS — E119 Type 2 diabetes mellitus without complications: Secondary | ICD-10-CM | POA: Diagnosis not present

## 2020-02-14 DIAGNOSIS — H04123 Dry eye syndrome of bilateral lacrimal glands: Secondary | ICD-10-CM | POA: Diagnosis not present

## 2020-02-15 DIAGNOSIS — R269 Unspecified abnormalities of gait and mobility: Secondary | ICD-10-CM | POA: Diagnosis not present

## 2020-02-17 DIAGNOSIS — R269 Unspecified abnormalities of gait and mobility: Secondary | ICD-10-CM | POA: Diagnosis not present

## 2020-02-25 ENCOUNTER — Ambulatory Visit: Payer: Medicare Other | Admitting: Physical Therapy

## 2020-03-01 DIAGNOSIS — R269 Unspecified abnormalities of gait and mobility: Secondary | ICD-10-CM | POA: Diagnosis not present

## 2020-03-03 DIAGNOSIS — R269 Unspecified abnormalities of gait and mobility: Secondary | ICD-10-CM | POA: Diagnosis not present

## 2020-03-06 DIAGNOSIS — R269 Unspecified abnormalities of gait and mobility: Secondary | ICD-10-CM | POA: Diagnosis not present

## 2020-03-07 DIAGNOSIS — Z8701 Personal history of pneumonia (recurrent): Secondary | ICD-10-CM | POA: Diagnosis not present

## 2020-03-07 DIAGNOSIS — L304 Erythema intertrigo: Secondary | ICD-10-CM | POA: Diagnosis not present

## 2020-03-07 DIAGNOSIS — Z23 Encounter for immunization: Secondary | ICD-10-CM | POA: Diagnosis not present

## 2020-03-07 DIAGNOSIS — J189 Pneumonia, unspecified organism: Secondary | ICD-10-CM | POA: Diagnosis not present

## 2020-03-08 DIAGNOSIS — R269 Unspecified abnormalities of gait and mobility: Secondary | ICD-10-CM | POA: Diagnosis not present

## 2020-03-13 DIAGNOSIS — R269 Unspecified abnormalities of gait and mobility: Secondary | ICD-10-CM | POA: Diagnosis not present

## 2020-03-15 DIAGNOSIS — R269 Unspecified abnormalities of gait and mobility: Secondary | ICD-10-CM | POA: Diagnosis not present

## 2020-03-21 DIAGNOSIS — R269 Unspecified abnormalities of gait and mobility: Secondary | ICD-10-CM | POA: Diagnosis not present

## 2020-03-23 DIAGNOSIS — R269 Unspecified abnormalities of gait and mobility: Secondary | ICD-10-CM | POA: Diagnosis not present

## 2020-03-24 DIAGNOSIS — M1990 Unspecified osteoarthritis, unspecified site: Secondary | ICD-10-CM | POA: Diagnosis not present

## 2020-03-24 DIAGNOSIS — M0609 Rheumatoid arthritis without rheumatoid factor, multiple sites: Secondary | ICD-10-CM | POA: Diagnosis not present

## 2020-03-24 DIAGNOSIS — M7989 Other specified soft tissue disorders: Secondary | ICD-10-CM | POA: Diagnosis not present

## 2020-03-24 DIAGNOSIS — Z79899 Other long term (current) drug therapy: Secondary | ICD-10-CM | POA: Diagnosis not present

## 2020-03-24 DIAGNOSIS — M79645 Pain in left finger(s): Secondary | ICD-10-CM | POA: Diagnosis not present

## 2020-03-24 DIAGNOSIS — R768 Other specified abnormal immunological findings in serum: Secondary | ICD-10-CM | POA: Diagnosis not present

## 2020-03-27 DIAGNOSIS — R269 Unspecified abnormalities of gait and mobility: Secondary | ICD-10-CM | POA: Diagnosis not present

## 2020-03-29 DIAGNOSIS — R269 Unspecified abnormalities of gait and mobility: Secondary | ICD-10-CM | POA: Diagnosis not present

## 2020-04-03 DIAGNOSIS — R269 Unspecified abnormalities of gait and mobility: Secondary | ICD-10-CM | POA: Diagnosis not present

## 2020-04-05 DIAGNOSIS — R269 Unspecified abnormalities of gait and mobility: Secondary | ICD-10-CM | POA: Diagnosis not present

## 2020-04-18 ENCOUNTER — Ambulatory Visit: Payer: Medicare Other

## 2020-04-18 ENCOUNTER — Other Ambulatory Visit: Payer: Self-pay

## 2020-04-18 DIAGNOSIS — I77811 Abdominal aortic ectasia: Secondary | ICD-10-CM | POA: Diagnosis not present

## 2020-04-18 DIAGNOSIS — I714 Abdominal aortic aneurysm, without rupture, unspecified: Secondary | ICD-10-CM

## 2020-04-18 DIAGNOSIS — I700 Atherosclerosis of aorta: Secondary | ICD-10-CM | POA: Diagnosis not present

## 2020-04-25 ENCOUNTER — Other Ambulatory Visit: Payer: Medicare Other

## 2020-04-25 ENCOUNTER — Other Ambulatory Visit: Payer: Self-pay | Admitting: Adult Health

## 2020-04-25 NOTE — Telephone Encounter (Signed)
Pt called, out of town, need a 5 day supply for  Primidone sent to [place], [place].  Would like a call from the nurse.

## 2020-04-26 MED ORDER — PRIMIDONE 50 MG PO TABS: 50.0000 mg | ORAL_TABLET | Freq: Two times a day (BID) | ORAL | 0 refills | Status: DC

## 2020-04-28 ENCOUNTER — Other Ambulatory Visit: Payer: Medicare Other

## 2020-05-04 ENCOUNTER — Encounter: Payer: Self-pay | Admitting: Neurology

## 2020-05-04 ENCOUNTER — Telehealth: Payer: Self-pay | Admitting: Neurology

## 2020-05-04 ENCOUNTER — Ambulatory Visit: Payer: Medicare Other | Admitting: Cardiology

## 2020-05-04 ENCOUNTER — Ambulatory Visit (INDEPENDENT_AMBULATORY_CARE_PROVIDER_SITE_OTHER): Payer: Medicare Other | Admitting: Neurology

## 2020-05-04 ENCOUNTER — Other Ambulatory Visit: Payer: Self-pay

## 2020-05-04 ENCOUNTER — Encounter: Payer: Self-pay | Admitting: Cardiology

## 2020-05-04 ENCOUNTER — Other Ambulatory Visit: Payer: Self-pay | Admitting: Neurology

## 2020-05-04 VITALS — BP 142/77 | HR 85 | Resp 16 | Ht 70.0 in | Wt 180.0 lb

## 2020-05-04 VITALS — BP 117/71 | HR 84 | Ht 70.0 in | Wt 182.0 lb

## 2020-05-04 DIAGNOSIS — I10 Essential (primary) hypertension: Secondary | ICD-10-CM | POA: Diagnosis not present

## 2020-05-04 DIAGNOSIS — G20 Parkinson's disease: Secondary | ICD-10-CM | POA: Diagnosis not present

## 2020-05-04 DIAGNOSIS — I714 Abdominal aortic aneurysm, without rupture, unspecified: Secondary | ICD-10-CM

## 2020-05-04 DIAGNOSIS — G4752 REM sleep behavior disorder: Secondary | ICD-10-CM | POA: Diagnosis not present

## 2020-05-04 MED ORDER — CARBIDOPA-LEVODOPA 25-100 MG PO TABS: 1.0000 | ORAL_TABLET | Freq: Three times a day (TID) | ORAL | 5 refills | Status: DC

## 2020-05-04 MED ORDER — PRIMIDONE 50 MG PO TABS: 25.0000 mg | ORAL_TABLET | Freq: Two times a day (BID) | ORAL | 5 refills | Status: DC

## 2020-05-04 NOTE — Progress Notes (Signed)
Patient is here for follow up visit.  Subjective:   Tyler Hayes, male    DOB: 01/10/1946, 74 y.o.   MRN: [id number]   Chief Complaint  Patient presents with  . Hypertension  . Follow-up  . Results     HPI  74 year old African-American male with uncontrolled hypertension, type II diabetes mellitus, former smoker, new diagnosis of LV noncompaction  Patient was see by clinical molecular geneticist Dr. Lattie Hayes. Her note below.  In summary, Tyler Hayes presents with LVNC at age 44 and is asymptomatic. The fact that his mother had CHF at a young age and died soon after is concerning. As Tyler Hayes is asymptomatic and his [place]:C ratio is below the diagnostic cut-off for LVNC, it is likely that he has a benign anatomic variant and hence, genetic testing is not warranted. Tyler Hayes verbalized understanding of this.  Patient is doing well, denies chest pain, shortness of breath, palpitations, leg edema, orthopnea, PND, TIA/syncope. He was recently diagnosed with Parkinson's disease. Blood pressure elevated today, but usually well controlled.    Current Outpatient Medications on File Prior to Visit  Medication Sig Dispense Refill  . amLODipine (NORVASC) 5 MG tablet TAKE 1 TABLET BY MOUTH EVERY DAY 30 tablet 6  . aspirin EC 81 MG tablet Take 1 tablet (81 mg total) by mouth daily. 90 tablet 3  . atorvastatin (LIPITOR) 10 MG tablet Take 10 mg by mouth daily.    . Cholecalciferol 50 MCG (2000 UT) TABS Take 2,000 Units by mouth daily.    . clonazePAM (KLONOPIN) 0.5 MG tablet At bedtime prn, use 1/2 tab and if needed add second half. (Patient not taking: Reported on 05/04/2020) 30 tablet 5  . fluticasone (FLONASE) 50 MCG/ACT nasal spray Place 1 spray into both nostrils daily as needed for rhinitis (drainage issues.).     Marland Kitchen leflunomide (ARAVA) 20 MG tablet Take 20 mg by mouth daily.    . Melatonin 3 MG CAPS Take 1 capsule (3 mg total) by mouth at bedtime. 30 capsule 0  . montelukast (SINGULAIR)  10 MG tablet Take 10 mg by mouth every evening.    . primidone (MYSOLINE) 50 MG tablet Take 0.5 tablets (25 mg total) by mouth 4 (four) times daily. (Patient taking differently: Take 25 mg by mouth 2 (two) times daily.) 30 tablet 5  . sulfaSALAzine (AZULFIDINE) 500 MG tablet Take 1 tablet by mouth 2 (two) times daily. (Patient not taking: Reported on 05/04/2020)    . SYNJARDY XR 09-998 MG TB24 Take 2 tablets by mouth daily.     Marland Kitchen triamterene-hydrochlorothiazide (MAXZIDE-25) 37.5-25 MG tablet Take 1 tablet by mouth daily.      No current facility-administered medications on file prior to visit.    Cardiovascular studies:  EKG 05/04/2020: Sinus rhythm 64 bpm  Anteroseptal infarct -age undetermined Precordial T-wave abnormality  -Possible anterolateral ischemia  No significant change compared to previous EKGs  Abdominal Aortic Duplex 04/18/2020:  The maximum aorta (sac) diameter is 2.34 cm (mid). There is mild ectasia  in the mid abdominal aorta.  Moderate diffuse calcific plaque observed throughout the abdominal aorta.  Normal flow velocities noted in the aorta and bilateral common iliac  arteries.  No AAA noted. Consider repeat scan in 5-10 years for stability of ectasia.  EKG 06/24/2019: Sinus rhythm 73 bpm. Anterolateral T wave inversion, unchanged from prior EKG. Correction: New since last EKG in 12/2018. LBBB no longer present.  Coronary angiography 05/26/2018: LM: Normal LAD: Normal LCx: Normal  Ramus intermedius: Normal RCA: Mid focal 20% disease LVEDP normal  Conclusion: Minimal nonobstructive coronary artery disease Normal LVEDP  Cardiac MRI 06/18/2018: 1. Mild LVE with diffuse hypokinesis and abnormal septal motion EF 42% 2. Mild ventricular non compaction ratio using posterior wall thickness 2:1 3.  Normal RV size and function  Echocardiogram 05/04/2018: Left ventricle cavity is small. Mild concentric hypertrophy of the left ventricle. The septum appears to  display appearance of LV non-compaction. There is mild hypokinesis of the the anterior, anterolateral walls. Mild decrease in LV systolic function. Doppler evidence of grade I (impaired) diastolic dysfunction, normal LAP. Visual EF is 40-45%.  Trace mitral regurgitation. Trace tricuspid regurgitation.  Lexiscan Nulcear stress test 05/02/2018: 1. Lexiscan stress with low level exercise was performed. Exercise capacity was not assessed. No stress symptoms reported. Blood pressure was normal. The resting electrocardiogram demonstrated normal sinus rhythm, normal resting conduction and no resting arrhythmias.  Anteroseptal T wave inversions seen. Stress EKG is non diagnostic for ischemia as it is a pharmacologic stress. However, heart rate increased to 87% of maximum predicted heart rate on level exercise. Stress electrocardiogram showed The stress electrocardiogram showed sinus tachycardia with ventricular bigeminy and persistent anteroseptal T wave inversion. This progressed to LBBB, that persisted several minutes into recovery.  2. The overall quality of the study is good.  Left ventricular cavity is noted to be enlarged on the rest and stress studies.  Gated SPECT imaging demonstrates anteroseptal dyskinesis along with global diffuse hypokinesis. LVEF calculated at 37%. SPECT images show medium sized, mild intensity, perfusion defect in apical to basal anteroseptal, more prominent on rest images. While these perfusion defects could be related to differential septal perfusion, ischemia cannot be excluded.  3. High risk study. Clinical correlation recommended due to possibility of differential septal perfusion during exercise induced left bundle branch block.  Abdominal aortic duplex 05/04/2018: Severe calcific plaque throughout the abdominal aorta.  Normal flow velocities noted in aorta and iliac vessels.  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   Recheck in 2 years.    Review of Systems  Cardiovascular: Negative for chest pain, dyspnea on exertion, leg swelling, palpitations and syncope.       Objective:    Vitals:   05/04/20 1011  BP: (!) 142/77  Pulse: 85  Resp: 16  SpO2: 95%     Physical Exam Vitals and nursing note reviewed.  Constitutional:      Appearance: He is well-developed.  Neck:     Vascular: No JVD.  Cardiovascular:     Rate and Rhythm: Normal rate and regular rhythm.     Pulses: Intact distal pulses.     Heart sounds: Normal heart sounds. No murmur heard.   Pulmonary:     Effort: Pulmonary effort is normal.     Breath sounds: Normal breath sounds. No wheezing or rales.         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   Abnormal EKG: Anatomical variant with trabeculations, but does not meet diagnostic criteria for LVNC. EF 42% without any heart failure signs/symptoms. Reasonable not to use anticoagulation. In absence of known CAD, also do not recommend Aspirin.   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 Repeat ultrasound in 10 years Continue statin  Hypertension: Fairly well controlled.  Type 2 DM: Continue current therapy. Managed  by PCP.  F/u in 1 year   Nigel Mormon, MD [facility] Pager: [phone] Office: [phone] If no answer Cell [phone]

## 2020-05-04 NOTE — Telephone Encounter (Signed)
Pt.'s wife Peter Congo is on [place]. She is asking how husband is supposed to take primidone (MYSOLINE) 50 MG tablet. She states the instructions are not clear. Please advise.

## 2020-05-04 NOTE — Progress Notes (Addendum)
SLEEP MEDICINE CLINIC   Provider:  Larey Seat, MD  Referring Provider: Deland Pretty, MD Primary Care Physician:  Deland Pretty, MD    HPI:  Tyler Hayes is a 74 y.o. male , seen here as a revisit on 05-04-2020, CD The patient has been originally referred from Dr Shelia Media after fall out of bed and had at the time carried already an diagnosis of essential tremor. 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 He started on melatonin and low dose Klonopin. Klonopin was d/c in the wake of feeling groggy. [name] traveled to [place] on labor day Aug 17, 2019- earlier this year- where he suffered a fall after feeling SOB, weak and malaised. He was diagnosed with community acquired pneumonia and admitted to a community hospital in [place], his gait was festinating, shuffling, and he chased his point of gravity, getting faster and faster downhill, he fell forward, flat-  After his observation in hospital he was diagnosed with PD- as we had been concerned. His memory has declined, is delayed. He feels internally a tremor and yet stiffness in his legs. His right hand is clumsier, handwriting has changed. Sinemet 25/ 100 mg was started tid and reduced the tremor.  Memory concerns remain.   MRI brain in [place]- 01-24-2020,  COMPARISON: None.  FINDINGS: Mild diffuse atrophy. There is moderate ventriculomegaly with a decreased callososeptal angle. Remote lacunar infarct in the left basal ganglia. Minimal patchy white matter hypoattenuation. No finding of acute ischemia, hemorrhage, or mass effect. Calvarium is unremarkable. Visualized paranasal sinuses and mastoid air cells are clear.  IMPRESSION: 1. Moderate ventriculomegaly slightly out of proportion to the degree of atrophy with a decreased callososeptal angle raises the possibility of normal pressure hydrocephalus. Recommend clinical correlation. 2. No finding of acute  ischemia or hemorrhage. 3. Mild atrophy and chronic small vessel ischemic changes. Remote lacunar infarct in the left basal ganglia.  Electronically Signed By: Yancey Flemings M.D. On: 01/24/2020 9:12:45 PM On: [id number]    EKG (2015)-sinus tachycardia 108 beats per minute; nonspecific interventricular conduction block with QRS of 128 milliseconds; nonspecific ST and T-wave abnormality; no STEMI. ED Medication Administration from 01/24/2020 1954 to 01/24/2020 [id number]  Date/Time Order Dose Route Action Action by  01/24/2020 August 16, 2237 sodium chloride 0.9 % bolus 1,000 mL 1,000 mL Intravenous [address] 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 stated " swinging, kicking and jumping in his sleep", also he mentioned that he developed a " right hand tremor". [name] reports that he acted out dreams sometimes with kicking more swaying even in his 73s. He is now in his second marriage but he remembers that his first wife was complaining about his abrupt movements and sometimes kicking her or bruising her. She died of leukemia in Aug 16, 2000 years ago. He also reports that he used to be physically so much more active before he retired that he felt he got deeper and more restorative sleep simply because he was physically in need of sleep. He believes  that acting out dreams has increased since his retirement, which began in 2013. He used to be a Holiday representative.  Sleep habits are as follows: He likes to retreat to the bedroom around 10 PM, relaxes there , reads and plays solitaire. Around 11:30 PM is when he usually initiate sleep. He has discovered that he doesn't sleep well if he  watches TV close to bedtime and therefore eliminated this. He shares a bedroom with his wife, the bedroom is cool ,quiet and dark, he sleeps on multiple pillows.He also reports that he most nights he sleeps well through the night but sometimes he has very vivid dreams and he tends to sleep on the right side, and his acting out of dreams is also correlated to sleeping on the right side. He avoids the supine sleep position as it causes his choking on sinus drainage. Acting out his dreams seems to start later than an hour into sleep and may last well into the morning hours usually he has only one episode of dream enactment and not multiple. Usually his dreams include being threatened, followed, protecting himself or his wife. Sometimes he feels that sometimes he tries to bite his legs fand he will flex and inadvertently kicked her. His wife will try to wake him up. He has left the bed . He is trembling and she will call out and wake him. If he goes back to sleep right away sometimes the dream will continue with the same incident.Sleep related medical history/ Family history:   Father had a tremor, died of a brain tumor while patient was in high school.   Social history:  Retired  Re -married, adult 2 sons and one step son , adult children, he quit smoking in 1992 , after  56 yeas, and 20 pack years. ETOH;  Seldom,  caffeine :2-3 cups in AM, I one glass of iced tea in PM,and 3 cokes a day.    Interval history from 06/26/2016, I have pleasure of seeing [name] today following a sleep study from 04/21/2016 the patient had no significant apnea, his AHI was 2.3 his REM AHI was 5.7 which is still considered low. He did not have any measurable apnea if he didn't sleep on his back. 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  Mr.  Tyler Hayes was last seen on 23 December 2016 by our nurse practitioner Vaughan Browner, and he is here today for a 6 months interval revisit on 25 June 2017.  [name] reports that he still takes daytime naps but his residual sleepiness during the day has been manageable.  He endorsed the Epworth Sleepiness Scale at 11 points which is just a little above average.  Fatigue was only endorsed at 16 points.  In his sleep study but took place exactly a year ago there was no significant sleep apnea noted.  He did have REM sleep behavior during the study- and his wife is reporting still active sleep behavior. He feels is less severe, but it is still a treat to his wife.  She has noticed that the dream behavior can occur several times in the same night, every night of the week. He sits up, screams and kicks. He seems to fight.  Lasts 2 minutes or less. Last night he was crying, and moving. He  has fallen out of bed. He uses a bed rail, and still fell out. Melatonin has helped but not eliminated the REM BD.   Jasyn Mey is a 74 y.o. male , seen here as a revisit from Dr. 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 His wife reports sleep talking every night, but not as much kicking, thrashing. He has last month fallen-  Out of the bed and hit his head.  His wife thinks he takes  Klonopin less than 2 a month. He otherwise feels he controls REM BD with melatonin. We also added a MMSE today, and he scored 29/ 30 points! Very good.   Review of Systems: Out of a complete 14 system review, the patient complains of only the following symptoms, and all other reviewed systems are negative.  Good memory, better control in REM BD, yelling, kicking thrashing.  REM BD. Some kicking, thrashing, yelling.  Less EDS with 7 points in Epworth-  but with naps in daytime.  FSS at 10 from 16 Points.  Tv watching  , but not in bedroom- a likes action TV and seems to enact these shows, dreams about them.   He sleep talks now but had no motor enactment.   Social History   Socioeconomic History  . Marital status: Married    Spouse name: Not on file  . Number of children: 3  . Years of education: Not on file  . Highest education level: Not on file  Occupational History  . Not on file  Tobacco Use  . Smoking status: Former Smoker    Packs/day: 1.50    Years: 20.00    Pack years: 30.00    Types: Cigarettes    Quit date: 02/01/1991    Years since quitting: 29.2  . Smokeless tobacco: Never Used  Vaping Use  . Vaping Use: Never used  Substance and Sexual Activity  . Alcohol use: Yes    Comment: occasionally  . Drug use: No  . Sexual activity: Not on file  Other Topics Concern  . Not on file  Social History Narrative  . Not on file   Social Determinants of Health   Financial Resource Strain: Not on file  Food Insecurity: Not on file  Transportation Needs: Not on file  Physical Activity: Not on file  Stress: Not on file  Social Connections: Not on file  Intimate Partner Violence: Not on file    Family History  Problem Relation Age of Onset  . Heart failure Mother   . CAD Mother   . CAD Sister        Brain tumor  . Stroke Sister   . Cancer Father   . Stroke Sister   . Coronary artery disease Sister   . Hypertension Son   . Diabetes Son   . Obesity Son   . Sleep apnea Son   . Heart murmur Son        No major structural issues noted.    Past Medical History:  Diagnosis Date  . Arthritis    back  . Cancer [facility])    prostate  . Diabetes mellitus without complication ([place])    on meds  . Diverticulosis   . Hypercholesteremia   . Hypertension   . Meatal stenosis   . Osteoarthritis   . Sinus drainage   . Tinea pedis     Past Surgical  History:  Procedure Laterality Date  . BACK SURGERY  1984, 2013   lumbar  . COLONOSCOPY  06/10/2019   Dr. Michail Sermon  . DG DILATION  URETERS  2003  . EYE SURGERY Bilateral 1999   Lasik  . LEFT HEART CATH AND CORONARY ANGIOGRAPHY N/A 05/26/2018   Procedure: LEFT HEART CATH AND CORONARY ANGIOGRAPHY;  Surgeon: Nigel Mormon, MD;  Location: [facility];  Service: Cardiovascular;  Laterality: N/A;  . POSTERIOR LUMBAR FUSION 4 LEVEL N/A 06/01/2014   Procedure: LUMBAR THREE TO FOUR, LUMBAR FOUR TO FIVE LAMINECTOMY,  RIGHT LUMBAR FUSION AT LUMBAR FOUR TO FIVE;  Surgeon: Floyce Stakes, MD;  Location: [facility];  Service: Neurosurgery;  Laterality: N/A;  POSSIBLE L2-3 L3-4 L4-5 L5-S1 POSTERIOR LUMBAR INTERBODY FUSION  . PROSTATECTOMY  2002    Current Outpatient Medications  Medication Sig Dispense Refill  . amLODipine (NORVASC) 5 MG tablet TAKE 1 TABLET BY MOUTH EVERY DAY 30 tablet 6  . aspirin EC 81 MG tablet Take 1 tablet (81 mg total) by mouth daily. 90 tablet 3  . atorvastatin (LIPITOR) 10 MG tablet Take 10 mg by mouth daily.    . Cholecalciferol 50 MCG (2000 UT) TABS Take 2,000 Units by mouth daily.    . fluticasone (FLONASE) 50 MCG/ACT nasal spray Place 1 spray into both nostrils daily as needed for rhinitis (drainage issues.).     Marland Kitchen leflunomide (ARAVA) 20 MG tablet Take 20 mg by mouth daily.    . Melatonin 3 MG CAPS Take 1 capsule (3 mg total) by mouth at bedtime. 30 capsule 0  . montelukast (SINGULAIR) 10 MG tablet Take 10 mg by mouth every evening.    . primidone (MYSOLINE) 50 MG tablet Take 0.5 tablets (25 mg total) by mouth 4 (four) times daily. (Patient taking differently: Take 25 mg by mouth 2 (two) times daily.) 30 tablet 5  . SYNJARDY XR 09-998 MG TB24 Take 2 tablets by mouth daily.     Marland Kitchen triamterene-hydrochlorothiazide (MAXZIDE-25) 37.5-25 MG tablet Take 1 tablet by mouth daily.     . clonazePAM (KLONOPIN) 0.5 MG tablet At bedtime prn, use 1/2 tab and if needed add second half. (Patient not taking: Reported on 05/04/2020) 30 tablet 5  . sulfaSALAzine (AZULFIDINE) 500 MG tablet Take 1 tablet by mouth  2 (two) times daily. (Patient not taking: Reported on 05/04/2020)     No current facility-administered medications for this visit.    Allergies as of 05/04/2020 - Review Complete 05/04/2020  Allergen Reaction Noted  . Ace inhibitors Cough 03/07/2016    Vitals: BP 117/71   Pulse 84   Ht 5\' 10"  (1.778 m)   Wt 182 lb (82.6 kg)   BMI 26.11 kg/m  Last Weight:  Wt Readings from Last 1 Encounters:  05/04/20 182 lb (82.6 kg)   TFT:DDUK mass index is 26.11 kg/m.     Last Height:   Ht Readings from Last 1 Encounters:  05/04/20 5\' 10"  (1.778 m)    Physical exam:  General: The patient is awake, alert and  well groomed. Head: Normocephalic, atraumatic.  Neck is supple. Mallampati 2 neck circumference:16.25,  Nasal airflow patent.   No Retrognathia - Full dentures.  Cardiovascular:  Regular rate and rhythm, without murmurs or carotid bruit, and without distended neck veins. Respiratory: Lungs are clear to auscultation. Skin:  Without evidence of edema, or rash Trunk: BMI is low. The patient's posture is still erect.   Neurologic exam : The patient  is awake and alert, oriented to place and time.   Memory subjective described as intact.  Attention span & concentration ability appears normal.  His wife also does not feel that he has memory loss. Speech is fluent, without dysarthria, mild dysphonia but no  aphasia.  Mood and affect are appropriate, concerned.  MMSE - Mini Mental State Exam 02/02/2019 07/21/2018 12/24/2017  Not completed: (No Data) - -  Orientation to time 5 5 5   Orientation to Place 5 5 5   Registration 3 3 3   Attention/ Calculation 5 5 5   Recall 2 2 1   Language- name 2 objects 2 2 2   Language- repeat 1 1 1   Language- follow 3 step command 3 3 2   Language- read & follow direction 1 1 1   Write a sentence 1 1 1   Copy design 1 1 1   Total score 29 29 27       Cranial nerves: Preserved sense of taste but loss of smell.  Pupils are equally reactive to light. Right  eye status post cataract surgery. Extraocular movements in vertical and horizontal planes intact and without nystagmus.  Visual fields by finger perimetry are intact. Hearing to finger rub intact. Facial sensation intact to fine touch. Facial motor strength is symmetric and tongue and uvula move midline. Shoulder shrug was symmetrical.  Motor exam:  [name] presents with cog-wheeling rigidity over the right biceps only, slight increase in tone at the right wrist.  There is a right hand resting tremor and action tremor noted.there is mild resting tremor in right hand , too.   None of these findings in the left hand.He noticed a change in his handwriting and that he has to concentrate on bringing food to his mouth.  Sensory:  Fine touch, pinprick and vibration were normal. Coordination: Rapid alternating movements in the fingers/hands was normal.  Finger-to-nose maneuver with right hand tremor. He feels clumsy- with handwriting changes.  Gait and station: Patient walks without assistive device . He is stooped, never has been before- he tries to look at his feet while walking. He turned with 5 steps. Stance is stable and normal.   Deep tendon reflexes: in the  upper and lower extremities are symmetric and intact. Babinski maneuver response is downgoing.  The patient was advised of the nature of the diagnosed sleep disorder ( REM BD ) , the treatment options and risks for general a health and wellness arising from not treating the condition.  He now displayes a parkinsonian gait and memory concerns were also voice.   I spent more than 25  minutes of face to face time with the patient.  I truly appreciated that Mrs. Purdue came to this visit with her husband and her input has helped greatly. 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 I would like for [name] to follow [place] every 6 months to see if  there is a development in terms of a less essential and more resting tremor, rigidity or memory loss. He has not shown any decrease in cognitive function.    Greater than 50% of time was spent in counseling and coordination of care. We have discussed the diagnosis and differential and I answered the patient's questions.     Assessment:  After physical and neurologic examination, review of laboratory studies,  Personal review of  polysomnography/ neurophysiology testing and pre-existing records as far as provided in visit., my assessment is  1) new resting tremor in the right hand-  TREMOR, but also right dominant cog wheeling- PD now on sinemet 25/100 mg .  REM BD :   Essential tremor was diagnosed in the past, now there is a differrent quality to his tremor associated with biceps rigidity.  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 His sleep study has confirmed that he has REM behavior related enactment of visit dreams.  He was at a higher risk of converting to PD.   Melatonin at 3 mg taken 30 minutes before intended bedtime each night.  Referral to parkinson's specific PT was already done, I recommended ACT for follow up fitness under [facility].   Will follow up alternating with NP q 6 month;  MOCA if MMSE over 26 points, please. Start with MMSE.    Asencion Partridge Cassell Voorhies MD  05/04/2020   CC: Deland Pretty, [place],  [place] [id number]

## 2020-05-04 NOTE — Patient Instructions (Signed)
Parkinson's Disease Parkinson's disease causes problems with movements. It is a long-term condition. It gets worse over time (is progressive). It affects each person in different ways. It makes it harder for you to:  Control how your body moves.  Move your body normally. The condition can range from mild to very bad (advanced). What are the causes? This condition results from a loss of brain cells called neurons. These brain cells make a chemical called dopamine, which is needed to control body movement. As the condition gets worse, the brain cells make less dopamine. This makes it hard to move or control your movements. The exact cause of this condition is not known. What increases the risk?  Being male.  Being age 58 or older.  Having family members who had Parkinson's disease.  Having had an injury to the brain.  Being very sad (depressed).  Being around things that are harmful or poisonous. What are the signs or symptoms? Symptoms of this condition can vary. The main symptoms have to do with movement. These include:  A tremor or shaking while you are resting that you cannot control.  Stiffness in your neck, arms, and legs.  Slowing of movement. This may include: ? Losing expressions of the face. ? Having trouble making small movements that are needed to button your clothing or brush your teeth.  Walking in a way that is not normal. You may walk with short, shuffling steps.  Loss of balance when standing. You may sway, fall backward, or have trouble making turns. Other symptoms include:  Being very sad, worried, or confused.  Seeing or hearing things that are not real.  Losing thinking abilities (dementia).  Trouble speaking or swallowing.  Having a hard time pooping (constipation).  Needing to pee right away, peeing often, or not being able to control when you pee or poop.  Sleep problems. How is this treated? There is no cure. The goal of treatment is to  manage your symptoms. Treatment may include:  Medicines.  Therapy to help with talking or movement.  Surgery to reduce shaking and other movements that you cannot control. Follow these instructions at home: Medicines  Take over-the-counter and prescription medicines only as told by your doctor.  Avoid taking pain or sleeping medicines. Eating and drinking  Follow instructions from your doctor about what you cannot eat or drink.  Do not drink alcohol. Activity  Talk with your doctor about if it is safe for you to drive.  Do exercises as told by your doctor. Lifestyle      Put in grab bars and railings in your home. These help to prevent falls.  Do not use any products that contain nicotine or tobacco, such as cigarettes, e-cigarettes, and chewing tobacco. If you need help quitting, ask your doctor.  Join a support group. General instructions  Talk with your doctor about what you need help with and what your safety needs are.  Keep all follow-up visits as told by your doctor, including any therapy visits to help with talking or moving. This is important. Contact a doctor if:  Medicines do not help your symptoms.  You feel off-balance.  You fall at home.  You need more help at home.  You have trouble swallowing.  You have a very hard time pooping.  You have a lot of side effects from your medicines.  You feel very sad, worried, or confused. Get help right away if:  You were hurt in a fall.  You see  or hear things that are not real.  You cannot swallow without choking.  You have chest pain or trouble breathing.  You do not feel safe at home.  You have thoughts about hurting yourself or others. If you ever feel like you may hurt yourself or others, or have thoughts about taking your own life, get help right away. You can go to your nearest emergency department or call:  Your local emergency services (911 in the [place]  A suicide crisis helpline, such  as the [place] at (754) 700-9673. This is open 24 hours a day. Summary  This condition causes problems with movements.  It is a long-term condition. It gets worse over time.  There is no cure. Treatment focuses on managing your symptoms.  Talk with your doctor about what you need help with and what your safety needs are.  Keep all follow-up visits as told by your doctor. This is important. This information is not intended to replace advice given to you by your health care provider. Make sure you discuss any questions you have with your health care provider. Document Revised: 07/23/2018 Document Reviewed: 07/23/2018 Elsevier Patient Education  [place]; Levodopa tablets What is this medicine? CARBIDOPA;LEVODOPA (kar bi DOE pa; lee voe DOE pa) is used to treat the symptoms of Parkinson's disease. This medicine may be used for other purposes; ask your health care provider or pharmacist if you have questions. COMMON BRAND NAME(S): Atamet, SINEMET What should I tell my health care provider before I take this medicine? They need to know if you have any of these conditions:  depression or other mental illness  diabetes  glaucoma  heart disease, including history of a heart attack  history of irregular heartbeat  kidney disease  liver disease  lung or breathing disease, like asthma  narcolepsy  sleep apnea  stomach or intestine problems  an unusual or allergic reaction to levodopa, carbidopa, other medicines, foods, dyes, or preservatives  pregnant or trying to get pregnant  breast-feeding How should I use this medicine? Take this medicine by mouth with a glass of water. Follow the directions on the prescription label. Take your doses at regular intervals. Do not take your medicine more often than directed. Do not stop taking except on the advice of your doctor or health care professional. Talk to your pediatrician  regarding the use of this medicine in children. Special care may be needed. Overdosage: If you think you have taken too much of this medicine contact a poison control center or emergency room at once. NOTE: This medicine is only for you. Do not share this medicine with others. What if I miss a dose? If you miss a dose, take it as soon as you can. If it is almost time for your next dose, take only that dose. Do not take double or extra doses. What may interact with this medicine? Do not take this medicine with any of the following medications:  MAOIs like Marplan, Nardil, and Parnate  reserpine  tetrabenazine This medicine may also interact with the following medications:  alcohol  droperidol  entacapone  iron supplements or multivitamins with iron  isoniazid, INH  linezolid  medicines for depression, anxiety, or psychotic disturbances  medicines for high blood pressure  medicines for sleep  metoclopramide  papaverine  procarbazine  tedizolid  rasagiline  selegiline  tolcapone This list may not describe all possible interactions. Give your health care provider a list of all the medicines, herbs,  non-prescription drugs, or dietary supplements you use. Also tell them if you smoke, drink alcohol, or use illegal drugs. Some items may interact with your medicine. What should I watch for while using this medicine? Visit your health care professional for regular checks on your progress. Tell your health care professional if your symptoms do not start to get better or if they get worse. Do not stop taking except on your health care professional's advice. You may develop a severe reaction. Your health care professional will tell you how much medicine to take. You may experience a wearing of effect prior to the time for your next dose of this medicine. You may also experience an on-off effect where the medicine apparently stops working for anything from a minute to several  hours, then suddenly starts working again. Tell your doctor or health care professional if any of these symptoms happen to you. Your dose may need to be changed. A high protein diet can slow or prevent absorption of this medicine. Avoid high protein foods near the time of taking this medicine to help to prevent these problems. Take this medicine at least 30 minutes before eating or one hour after meals. You may want to eat higher protein foods later in the day or in small amounts. Discuss your diet with your doctor or health care professional or nutritionist. You may get drowsy or dizzy. Do not drive, use machinery, or do anything that needs mental alertness until you know how this drug affects you. Do not stand or sit up quickly, especially if you are an older patient. This reduces the risk of dizzy or fainting spells. Alcohol may interfere with the effect of this medicine. Avoid alcoholic drinks. When taking this medicine, you may fall asleep without notice. You may be doing activities like driving a car, talking, or eating. You may not feel drowsy before it happens. Contact your health care provider right away if this happens to you. There have been reports of increased sexual urges or other strong urges such as gambling while taking this medicine. If you experience any of these while taking this medicine, you should report this to your health care provider as soon as possible. If you are diabetic, this medicine may interfere with the accuracy of some tests for sugar or ketones in the urine (does not interfere with blood tests). Check with your doctor or health care professional before changing the dose of your diabetic medicine. This medicine may discolor the urine or sweat, making it look darker or red in color. This is of no cause for concern. However, this may stain clothing or fabrics. This medicine may cause a decrease in vitamin B6. You should make sure that you get enough vitamin B6 while you are  taking this medicine. Discuss the foods you eat and the vitamins you take with your health care professional. What side effects may I notice from receiving this medicine? Side effects that you should report to your doctor or health care professional as soon as possible:  allergic reactions like skin rash, itching or hives, swelling of the face, lips, or tongue  changes in emotions or moods  falling asleep during normal activities like driving  fast, irregular heartbeat  feeling faint or lightheaded, falls  fever  hallucinations  new or increased gambling urges, sexual urges, uncontrolled spending, binge or compulsive eating, or other urges  stomach pain  trouble passing urine or change in the amount of urine  uncontrollable movements of the arms, face,  head, mouth, neck, or upper body Side effects that usually do not require medical attention (report to your doctor or health care professional if they continue or are bothersome):  dizziness  headache  loss of appetite  nausea  trouble sleeping This list may not describe all possible side effects. Call your doctor for medical advice about side effects. You may report side effects to FDA at 1-800-FDA-1088. Where should I keep my medicine? Keep out of the reach of children. Store at room temperature between 15 and 30 degrees C (59 and 86 degrees F). Protect from light. Throw away any unused medicine after the expiration date. NOTE: This sheet is a summary. It may not cover all possible information. If you have questions about this medicine, talk to your doctor, pharmacist, or health care provider.  2020 Elsevier/Gold Standard (2019-01-11 19:49:59)   There are well-accepted and sensible ways to reduce risk for Alzheimers disease and other degenerative brain disorders .  Exercise Daily Walk A daily 20 minute walk should be part of your routine. Disease related apathy can be a significant roadblock to exercise and the only way  to overcome this is to make it a daily routine and perhaps have a reward at the end (something your loved one loves to eat or drink perhaps) or a personal trainer coming to the home can also be very useful. Most importantly, the patient is much more likely to exercise if the caregiver / spouse does it with him/her. In general a structured, repetitive schedule is best.  General Health: Any diseases which effect your body will effect your brain such as a pneumonia, urinary infection, blood clot, heart attack or stroke. Keep contact with your primary care doctor for regular follow ups.  Sleep. A good nights sleep is healthy for the brain. Seven hours is recommended. If you have insomnia or poor sleep habits we can give you some instructions. If you have sleep apnea wear your mask.  Diet: Eating a heart healthy diet is also a good idea; fish and poultry instead of red meat, nuts (mostly non-peanuts), vegetables, fruits, olive oil or canola oil (instead of butter), minimal salt (use other spices to flavor foods), whole grain rice, bread, cereal and pasta and wine in moderation.Research is now showing that the MIND diet, which is a combination of The Mediterranean diet and the DASH diet, is beneficial for cognitive processing and longevity. Information about this diet can be found in The MIND Diet, a book by Doyne Keel, MS, RDN, and online at [employer]book[employer].si  Finances, Power of Attorney and Advance Directives: You should consider putting legal safeguards in place with regard to financial and medical decision making. While the spouse always has power of attorney for medical and financial issues in the absence of any form, you should consider what you want in case the spouse / caregiver is no longer around or capable of making decisions.   The Alzheimers Association Position on Disease Prevention  Can Alzheimer's be prevented? It's a question that continues to intrigue researchers  and fuel new investigations. There are no clear-cut answers yet -- partially due to the need for more large-scale studies in diverse populations -- but promising research is under way. The Alzheimer's Association is leading the worldwide effort to find a treatment for Alzheimer's, delay its onset and prevent it from developing.   What causes Alzheimer's? Experts agree that in the vast majority of cases, Alzheimer's, like other common chronic conditions, probably develops as a result of complex  interactions among multiple factors, including age, genetics, environment, lifestyle and coexisting medical conditions. Although some risk factors -- such as age or genes -- cannot be changed, other risk factors -- such as high blood pressure and lack of exercise -- usually can be changed to help reduce risk. Research in these areas may lead to new ways to detect those at highest risk.  Prevention studies A small percentage of people with Alzheimer's disease (less than 1 percent) have an early-onset type associated with genetic mutations. Individuals who have these genetic mutations are guaranteed to develop the disease. An ongoing clinical trial conducted by the [facility] ([facility]), is testing whether antibodies to beta-amyloid can reduce the accumulation of beta-amyloid plaque in the brains of people with such genetic mutations and thereby reduce, delay or prevent symptoms. Participants in the trial are receiving antibodies (or placebo) before they develop symptoms, and the development of beta-amyloid plaques is being monitored by brain scans and other tests.  Another clinical trial, known as the A4 trial (Anti-Amyloid Treatment in Asymptomatic Alzheimer's), is testing whether antibodies to beta-amyloid can reduce the risk of Alzheimer's disease in older people (ages 58 to 25) at high risk for the disease. The A4 trial is being conducted by the [facility]  Study.  Though research is still evolving, evidence is strong that people can reduce their risk by making key lifestyle changes, including participating in regular activity and maintaining good heart health. Based on this research, the Alzheimer's Association offers 10 Ways to Love Your Brain -- a collection of tips that can reduce the risk of cognitive decline.  Heart-head connection  New research shows there are things we can do to reduce the risk of mild cognitive impairment and dementia.  Several conditions known to increase the risk of cardiovascular disease -- such as high blood pressure, diabetes and high cholesterol -- also increase the risk of developing Alzheimer's. Some autopsy studies show that as many as 66 percent of individuals with Alzheimer's disease also have cardiovascular disease.  A longstanding question is why some people develop hallmark Alzheimer's plaques and tangles but do not develop the symptoms of Alzheimer's. Vascular disease may help researchers eventually find an answer. Some autopsy studies suggest that plaques and tangles may be present in the brain without causing symptoms of cognitive decline unless the brain also shows evidence of vascular disease. More research is needed to better understand the link between vascular health and Alzheimer's.  Physical exercise and diet Regular physical exercise may be a beneficial strategy to lower the risk of Alzheimer's and vascular dementia. Exercise may directly benefit brain cells by increasing blood and oxygen flow in the brain. Because of its known cardiovascular benefits, a medically approved exercise program is a valuable part of any overall wellness plan.  Current evidence suggests that heart-healthy eating may also help protect the brain. Heart-healthy eating includes limiting the intake of sugar and saturated fats and making sure to eat plenty of fruits, vegetables, and whole grains. No one diet is best. Two diets that  have been studied and may be beneficial are the DASH (Dietary Approaches to Stop Hypertension) diet and the Mediterranean diet. The DASH diet emphasizes vegetables, fruits and fat-free or low-fat dairy products; includes whole grains, fish, poultry, beans, seeds, nuts and vegetable oils; and limits sodium, sweets, sugary beverages and red meats. A Mediterranean diet includes relatively little red meat and emphasizes whole grains, fruits and vegetables, fish and shellfish, and nuts, olive oil and  other healthy fats.  Social connections and intellectual activity A number of studies indicate that maintaining strong social connections and keeping mentally active as we age might lower the risk of cognitive decline and Alzheimer's. Experts are not certain about the reason for this association. It may be due to direct mechanisms through which social and mental stimulation strengthen connections between nerve cells in the brain.  Head trauma There appears to be a strong link between future risk of Alzheimer's and serious head trauma, especially when injury involves loss of consciousness. You can help reduce your risk of Alzheimer's by protecting your head. . Wear a seat belt . Use a helmet when participating in sports . "Fall-proof" your home .  What you can do now While research is not yet conclusive, certain lifestyle choices, such as physical activity and diet, may help support brain health and prevent Alzheimer's. Many of these lifestyle changes have been shown to lower the risk of other diseases, like heart disease and diabetes, which have been linked to Alzheimer's. With few drawbacks and plenty of known benefits, healthy lifestyle choices can improve your health and possibly protect your brain.  Learn more about brain health. You can help increase our knowledge by considering participation in a clinical study. Our free clinical trial matching services, TrialMatch, can help you find clinical trials in  your area that are seeking volunteers.  Understanding prevention research Here are some things to keep in mind about the research underlying much of our current knowledge about possible prevention: . Insights about potentially modifiable risk factors apply to large population groups, not to individuals. Studies can show that factor X is associated with outcome Y, but cannot guarantee that any specific person will have that outcome. As a result, you can "do everything right" and still have a serious health problem or "do everything wrong" and live to be 100. . Much of our current evidence comes from large epidemiological studies such as the [place] Aging Study, the Nurses' Health Study, the Adult Changes in Thought Study and the [employer] These studies explore pre-existing behaviors and use statistical methods to relate those behaviors to health outcomes. This type of study can show an "association" between a factor and an outcome but cannot "prove" cause and effect. This is why we describe evidence based on these studies with such language as "suggests," "may show," "might protect," and "is associated with." . The gold standard for showing cause and effect is a clinical trial in which participants are randomly assigned to a prevention or risk management strategy or a control group. Researchers follow the two groups over time to see if their outcomes differ significantly. . It is unlikely that some prevention or risk management strategies will ever be tested in randomized trials for ethical or practical reasons. One example is exercise. Definitively testing the impact of exercise on Alzheimer's risk would require a huge trial enrolling thousands of people and following them for many years. The expense and logistics of such a trial would be prohibitive, and it would require some people to go without exercise, a known health benefit.

## 2020-05-04 NOTE — Telephone Encounter (Signed)
Called the patient's wife back and advised that I discussed with Dr Brett Fairy and she is fine with how the patient is currently taking the medication. It appeared the patient was taking 25 mg (1/2 tablet) in AM and Evening. Advised that if that is working well for him Dr Brett Fairy would encourage to continue taking at that dose. I have corrected the script on our end to reflect that change to eliminate confusion. Pt will take this in addition to adding the sinemet prescribed today. Pt's wife verbalized understanding.

## 2020-05-12 ENCOUNTER — Ambulatory Visit: Payer: Medicare Other | Admitting: Cardiology

## 2020-05-17 DIAGNOSIS — E7800 Pure hypercholesterolemia, unspecified: Secondary | ICD-10-CM | POA: Diagnosis not present

## 2020-05-17 DIAGNOSIS — I10 Essential (primary) hypertension: Secondary | ICD-10-CM | POA: Diagnosis not present

## 2020-05-17 DIAGNOSIS — G20 Parkinson's disease: Secondary | ICD-10-CM | POA: Diagnosis not present

## 2020-05-17 DIAGNOSIS — E118 Type 2 diabetes mellitus with unspecified complications: Secondary | ICD-10-CM | POA: Diagnosis not present

## 2020-05-18 ENCOUNTER — Ambulatory Visit: Payer: Medicare Other | Admitting: Neurology

## 2020-05-28 ENCOUNTER — Other Ambulatory Visit: Payer: Self-pay | Admitting: Cardiology

## 2020-05-28 DIAGNOSIS — I10 Essential (primary) hypertension: Secondary | ICD-10-CM

## 2020-06-06 ENCOUNTER — Other Ambulatory Visit: Payer: Self-pay | Admitting: Neurology

## 2020-06-06 DIAGNOSIS — G250 Essential tremor: Secondary | ICD-10-CM

## 2020-06-06 DIAGNOSIS — R269 Unspecified abnormalities of gait and mobility: Secondary | ICD-10-CM

## 2020-06-06 DIAGNOSIS — G20 Parkinson's disease: Secondary | ICD-10-CM

## 2020-06-07 ENCOUNTER — Other Ambulatory Visit: Payer: Medicare Other

## 2020-06-07 DIAGNOSIS — Z20822 Contact with and (suspected) exposure to covid-19: Secondary | ICD-10-CM | POA: Diagnosis not present

## 2020-06-09 LAB — NOVEL CORONAVIRUS, NAA: SARS-CoV-2, NAA: NOT DETECTED

## 2020-06-09 LAB — SARS-COV-2, NAA 2 DAY TAT

## 2020-06-26 DIAGNOSIS — M1990 Unspecified osteoarthritis, unspecified site: Secondary | ICD-10-CM | POA: Diagnosis not present

## 2020-06-26 DIAGNOSIS — Z79899 Other long term (current) drug therapy: Secondary | ICD-10-CM | POA: Diagnosis not present

## 2020-06-26 DIAGNOSIS — M7989 Other specified soft tissue disorders: Secondary | ICD-10-CM | POA: Diagnosis not present

## 2020-06-26 DIAGNOSIS — M0609 Rheumatoid arthritis without rheumatoid factor, multiple sites: Secondary | ICD-10-CM | POA: Diagnosis not present

## 2020-06-26 DIAGNOSIS — M79645 Pain in left finger(s): Secondary | ICD-10-CM | POA: Diagnosis not present

## 2020-06-26 DIAGNOSIS — N1831 Chronic kidney disease, stage 3a: Secondary | ICD-10-CM | POA: Diagnosis not present

## 2020-06-26 DIAGNOSIS — R768 Other specified abnormal immunological findings in serum: Secondary | ICD-10-CM | POA: Diagnosis not present

## 2020-07-03 DIAGNOSIS — E1122 Type 2 diabetes mellitus with diabetic chronic kidney disease: Secondary | ICD-10-CM | POA: Diagnosis not present

## 2020-07-03 DIAGNOSIS — J309 Allergic rhinitis, unspecified: Secondary | ICD-10-CM | POA: Diagnosis not present

## 2020-07-03 DIAGNOSIS — N1831 Chronic kidney disease, stage 3a: Secondary | ICD-10-CM | POA: Diagnosis not present

## 2020-07-03 DIAGNOSIS — Z8546 Personal history of malignant neoplasm of prostate: Secondary | ICD-10-CM | POA: Diagnosis not present

## 2020-07-03 DIAGNOSIS — I129 Hypertensive chronic kidney disease with stage 1 through stage 4 chronic kidney disease, or unspecified chronic kidney disease: Secondary | ICD-10-CM | POA: Diagnosis not present

## 2020-07-03 DIAGNOSIS — E118 Type 2 diabetes mellitus with unspecified complications: Secondary | ICD-10-CM | POA: Diagnosis not present

## 2020-07-03 DIAGNOSIS — I10 Essential (primary) hypertension: Secondary | ICD-10-CM | POA: Diagnosis not present

## 2020-07-03 DIAGNOSIS — E785 Hyperlipidemia, unspecified: Secondary | ICD-10-CM | POA: Diagnosis not present

## 2020-07-03 DIAGNOSIS — Z125 Encounter for screening for malignant neoplasm of prostate: Secondary | ICD-10-CM | POA: Diagnosis not present

## 2020-07-10 DIAGNOSIS — G20 Parkinson's disease: Secondary | ICD-10-CM | POA: Diagnosis not present

## 2020-07-10 DIAGNOSIS — M0609 Rheumatoid arthritis without rheumatoid factor, multiple sites: Secondary | ICD-10-CM | POA: Diagnosis not present

## 2020-07-10 DIAGNOSIS — J309 Allergic rhinitis, unspecified: Secondary | ICD-10-CM | POA: Diagnosis not present

## 2020-07-10 DIAGNOSIS — Z8546 Personal history of malignant neoplasm of prostate: Secondary | ICD-10-CM | POA: Diagnosis not present

## 2020-07-10 DIAGNOSIS — E118 Type 2 diabetes mellitus with unspecified complications: Secondary | ICD-10-CM | POA: Diagnosis not present

## 2020-07-10 DIAGNOSIS — J4521 Mild intermittent asthma with (acute) exacerbation: Secondary | ICD-10-CM | POA: Diagnosis not present

## 2020-07-10 DIAGNOSIS — I10 Essential (primary) hypertension: Secondary | ICD-10-CM | POA: Diagnosis not present

## 2020-07-10 DIAGNOSIS — I714 Abdominal aortic aneurysm, without rupture: Secondary | ICD-10-CM | POA: Diagnosis not present

## 2020-07-10 DIAGNOSIS — E7800 Pure hypercholesterolemia, unspecified: Secondary | ICD-10-CM | POA: Diagnosis not present

## 2020-07-10 DIAGNOSIS — Z0000 Encounter for general adult medical examination without abnormal findings: Secondary | ICD-10-CM | POA: Diagnosis not present

## 2020-07-11 ENCOUNTER — Encounter: Payer: Self-pay | Admitting: Neurology

## 2020-07-23 ENCOUNTER — Encounter: Payer: Self-pay | Admitting: Neurology

## 2020-07-23 ENCOUNTER — Other Ambulatory Visit: Payer: Self-pay | Admitting: Adult Health

## 2020-07-24 ENCOUNTER — Other Ambulatory Visit: Payer: Self-pay | Admitting: Neurology

## 2020-07-24 MED ORDER — CARBIDOPA-LEVODOPA 25-100 MG PO TABS: 1.0000 | ORAL_TABLET | Freq: Three times a day (TID) | ORAL | 0 refills | Status: DC

## 2020-08-16 DIAGNOSIS — I2584 Coronary atherosclerosis due to calcified coronary lesion: Secondary | ICD-10-CM | POA: Diagnosis not present

## 2020-08-16 DIAGNOSIS — R9389 Abnormal findings on diagnostic imaging of other specified body structures: Secondary | ICD-10-CM | POA: Diagnosis not present

## 2020-08-16 DIAGNOSIS — I2510 Atherosclerotic heart disease of native coronary artery without angina pectoris: Secondary | ICD-10-CM | POA: Diagnosis not present

## 2020-08-23 DIAGNOSIS — R918 Other nonspecific abnormal finding of lung field: Secondary | ICD-10-CM | POA: Diagnosis not present

## 2020-08-23 DIAGNOSIS — Z8709 Personal history of other diseases of the respiratory system: Secondary | ICD-10-CM | POA: Diagnosis not present

## 2020-08-24 ENCOUNTER — Telehealth: Payer: Self-pay | Admitting: Cardiology

## 2020-08-24 DIAGNOSIS — R931 Abnormal findings on diagnostic imaging of heart and coronary circulation: Secondary | ICD-10-CM | POA: Insufficient documentation

## 2020-08-24 DIAGNOSIS — I2510 Atherosclerotic heart disease of native coronary artery without angina pectoris: Secondary | ICD-10-CM

## 2020-08-24 NOTE — Telephone Encounter (Signed)
Received a note from Dr. Pennie Banter office with the following results.  I called the patient, left voicemail to call back.  CT cardiac scoring 08/14/2020: Total score 443 (50-75th percentile) LM: 33 LAD: 9 LCx: 70 RCA: 340 PDA: 0  07/03/2020: Glucose 108, BUN/Cr 13/1.26. EGFR 65. Na/K 142/3.7. Rest of the CMP normal H/H 13/43. MCV 90. Platelets 175 HbA1C 5.3% Chol 156, TG 57, HDL 59, LDL 85  Coronary angiogram 05/26/2018:  LM: Normal LAD: Normal LCx: Normal Ramus intermedius: Normal RCA: Mid focal 20% disease LVEDP normal  Conclusion: Minimal nonobstructive coronary artery disease Normal LVEDP  It appears that there is more calcification noted on CT scan now compared to coronary angiogram 2 years ago.  I recommend exercise nuclear stress test and aggressive risk factor modification.  I will see him for follow-up after stress test.   Nigel Mormon, MD Pager: [phone] Office: [phone]

## 2020-08-29 ENCOUNTER — Telehealth: Payer: Self-pay | Admitting: Neurology

## 2020-08-29 ENCOUNTER — Encounter: Payer: Self-pay | Admitting: Neurology

## 2020-08-29 ENCOUNTER — Ambulatory Visit (INDEPENDENT_AMBULATORY_CARE_PROVIDER_SITE_OTHER): Payer: Medicare Other | Admitting: Neurology

## 2020-08-29 ENCOUNTER — Other Ambulatory Visit: Payer: Self-pay | Admitting: *Deleted

## 2020-08-29 VITALS — BP 118/76 | HR 100 | Ht 70.0 in | Wt 179.0 lb

## 2020-08-29 DIAGNOSIS — R931 Abnormal findings on diagnostic imaging of heart and coronary circulation: Secondary | ICD-10-CM

## 2020-08-29 DIAGNOSIS — G20 Parkinson's disease: Secondary | ICD-10-CM

## 2020-08-29 DIAGNOSIS — G4752 REM sleep behavior disorder: Secondary | ICD-10-CM | POA: Diagnosis not present

## 2020-08-29 DIAGNOSIS — R490 Dysphonia: Secondary | ICD-10-CM

## 2020-08-29 DIAGNOSIS — G250 Essential tremor: Secondary | ICD-10-CM

## 2020-08-29 DIAGNOSIS — R269 Unspecified abnormalities of gait and mobility: Secondary | ICD-10-CM

## 2020-08-29 DIAGNOSIS — G20C Parkinsonism, unspecified: Secondary | ICD-10-CM

## 2020-08-29 MED ORDER — PRIMIDONE 50 MG PO TABS: 25.0000 mg | ORAL_TABLET | Freq: Two times a day (BID) | ORAL | 0 refills | Status: DC

## 2020-08-29 MED ORDER — CARBIDOPA-LEVODOPA 25-100 MG PO TABS: 1.0000 | ORAL_TABLET | Freq: Three times a day (TID) | ORAL | 3 refills | Status: DC

## 2020-08-29 NOTE — Progress Notes (Signed)
SLEEP MEDICINE CLINIC   Provider:  Larey Seat, MD  Referring Provider: Deland Pretty, MD Primary Care Physician:  Tyler Pretty, MD    HPI:  Tyler Hayes is a 75 y.o. male , seen here as a revisit on 08-29-2020, REM BD, cognitive decline, Tremor with parkinsonism.   At the pleasure of meeting today of his [name] and we are looking at the results of his Montreal cognitive assessment test on his M and S Mini-Mental Status Examination on the MMSE the patient scored 29 out of 30 points on the [facility] cognitive assessment he scored 22 and it would be 22 out of 30 points based on impairment of fine motor skills that make it harder for him to write to draw or to connect the dots on the visual spatial executive function test he did well on the Trail making he created a clock face ( Dali-Style)  but the hands of the clock and the numbers were clearly arranged as they should be.   He would be able to name 2 of the 3 animals he only recalled 1 out of 5 words but he was fully oriented and his serial sevens abstraction was intact 2.  So I think that this is basically a short-term memory loss and some degree of word finding delay he created after he named 11 animals on the Speed test which is just the border to make a point in this department.  He did very well with repeating a simple sentence but the longer convoluted sentences on the [facility] cognitive assessment were harder.  ~I basically think this is short term memory loss impairment and it would also be to some degree an attention problem.  He was started on sinemet last visit, does well, no visual hallucinations. He is on primidone. 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             05-04-2020, CD The patient has been originally referred from Dr Shelia Media after fall out of bed and had at the time carried already an diagnosis of essential  tremor. With the dx of REM BD there was a high chance of converting to PD, and we planned follow his memory with [place] regularly. He started on melatonin and low dose Klonopin. Klonopin was d/c in the wake of feeling groggy. [name] traveled to [place] on labor day 2021- earlier this year- where he suffered a fall after feeling SOB, weak and malaised. He was diagnosed with community acquired pneumonia and admitted to a community hospital in [place], his gait was festinating, shuffling, and he chased his point of gravity, getting faster and faster downhill, he fell forward, flat-  After his observation in hospital he was diagnosed with PD- as we had been concerned. His memory has declined, is delayed. He feels internally a tremor and yet stiffness in his legs. His right hand is clumsier, handwriting has changed. Sinemet 25/ 100 mg was started tid and reduced the tremor.  Memory concerns remain.   MRI brain in [place]- 01-24-2020,  COMPARISON: None.  FINDINGS: Mild diffuse atrophy. There is moderate ventriculomegaly with a decreased callososeptal angle. Remote lacunar infarct in the left basal ganglia. Minimal patchy white matter hypoattenuation. No finding of acute ischemia, hemorrhage, or mass effect. Calvarium is unremarkable. Visualized paranasal sinuses and mastoid air cells are clear.  IMPRESSION: 1. Moderate ventriculomegaly slightly out of proportion to  the degree of atrophy with a decreased callososeptal angle raises the possibility of normal pressure hydrocephalus. Recommend clinical correlation. 2. No finding of acute ischemia or hemorrhage. 3. Mild atrophy and chronic small vessel ischemic changes. Remote lacunar infarct in the left basal ganglia.  Electronically Signed By: Yancey Flemings M.D. On: 01/24/2020 9:12:45 PM On: [id number]    EKG (2015)-sinus tachycardia 108 beats per minute; nonspecific interventricular conduction block with QRS of 128 milliseconds; nonspecific ST and T-wave  abnormality; no STEMI. ED Medication Administration from 01/24/2020 1954 to 01/24/2020 Aug 13, 2302  Date/Time Order Dose Route Action Action by  01/24/2020 08-12-37 sodium chloride 0.9 % bolus 1,000 mL 1,000 mL Intravenous [address] 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 stated " swinging, kicking and jumping in his sleep", also he mentioned that he developed a " right hand tremor". [name] reports that he acted out dreams sometimes with kicking more swaying even in his 20s. He is now in his second marriage but he remembers that his first wife was complaining about his abrupt movements and sometimes kicking her or bruising her. She died of leukemia in 12-Aug-2000 years ago. He also reports that he used to be physically so much more active before he retired that he felt he got deeper and more restorative sleep simply because he was physically in need of sleep. He believes that acting out dreams has increased since his retirement, which began in 2011-08-13. He used to be a Holiday representative.  Sleep habits are as follows: He likes to retreat to the bedroom around 10 PM, relaxes there , reads and plays solitaire. Around 11:30 PM is when he usually initiate sleep. He has discovered that he doesn't sleep well if he watches TV close to bedtime and therefore eliminated this. He shares a bedroom with his wife, the bedroom is cool ,quiet and dark, he sleeps on multiple pillows.He also reports that he most nights he sleeps well through the night but sometimes he has very vivid dreams and he tends to sleep on the right side, and his acting out of dreams is also correlated to sleeping on the right side.  He avoids the supine sleep position as it causes his choking on sinus drainage. Acting out his dreams seems to start later than an hour into sleep and may last well into the morning hours usually he has only one episode of dream enactment and not multiple. Usually his dreams include being threatened, followed, protecting himself or his wife. Sometimes he feels that sometimes he tries to bite his legs fand he will flex and inadvertently kicked her. His wife will try to wake him up. He has left the bed . He is trembling and she will call out and wake him. If he goes back to sleep right away sometimes the dream will continue with the same incident.Sleep related medical history/ Family history:   Father had a tremor, died of a brain tumor while patient was in high school.   Social history:  Retired  Re -married, adult 2 sons and one step son , adult children,  he quit smoking in 1992 , after  15 yeas, and 20 pack years. ETOH;  Seldom,  caffeine :2-3 cups in AM, I one glass of iced tea in PM,and 3 cokes a day.    Interval history from 06/26/2016, I have pleasure of seeing [name] today following a sleep study from 04/21/2016 the patient had no significant apnea, his AHI was 2.3 his REM AHI was 5.7 which is still considered low. He did not have any measurable apnea if he didn't sleep on his back. 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  [name] was last seen on 23 December 2016 by our nurse practitioner Vaughan Browner, and he is here today for a 6 months interval revisit on 25 June 2017.  [name] reports that he still takes daytime naps but his residual sleepiness during the day has been manageable.  He endorsed the Epworth Sleepiness Scale at 11 points which is just a little above average.  Fatigue was only endorsed  at 16 points.  In his sleep study but took place exactly a year ago there was no significant sleep apnea noted.  He did have REM sleep behavior during the study- and his wife is reporting still active sleep behavior. He feels is less severe, but it is still a treat to his wife.  She has noticed that the dream behavior can occur several times in the same night, every night of the week. He sits up, screams and kicks. He seems to fight.  Lasts 2 minutes or less. Last night he was crying, and moving. He has fallen out of bed. He uses a bed rail, and still fell out. Melatonin has helped but not eliminated the REM BD.   Tyler Hayes is a 75 y.o. male , seen here as a revisit from Dr. 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 His wife reports sleep talking every night, but not as much kicking, thrashing. He has last month fallen-  Out of the bed and hit his head.  His wife thinks he takes  Klonopin less than 2 a month. He otherwise feels he controls REM BD with melatonin. We also added a MMSE today, and he scored 29/ 30 points! Very good.   Review of Systems: Out of a complete 14 system review, the patient complains of only the following symptoms, and all other reviewed systems are negative.  Good memory, better control in REM BD, yelling, kicking thrashing.  Primidone and sinemet . REM BD. Some kicking, thrashing, yelling.  Less EDS with 7 points in Epworth-  but with naps in daytime.  FSS at 10 from 16 Points.  Tv watching , but not in bedroom- a likes action TV and seems to enact these shows, dreams about them.   He sleep talks now but had no motor enactment as witnessed by wife, however he fell out of bed in the last 14 days .    Social History   Socioeconomic History  . Marital status: Married    Spouse name: Not on file  . Number of children: 3  . Years of education:  Not on file  . Highest education level: Not on file  Occupational History  .  Not on file  Tobacco Use  . Smoking status: Former Smoker    Packs/day: 1.50    Years: 20.00    Pack years: 30.00    Types: Cigarettes    Quit date: 02/01/1991    Years since quitting: 29.5  . Smokeless tobacco: Never Used  Vaping Use  . Vaping Use: Never used  Substance and Sexual Activity  . Alcohol use: Yes    Comment: occasionally  . Drug use: No  . Sexual activity: Not on file  Other Topics Concern  . Not on file  Social History Narrative  . Not on file   Social Determinants of Health   Financial Resource Strain: Not on file  Food Insecurity: Not on file  Transportation Needs: Not on file  Physical Activity: Not on file  Stress: Not on file  Social Connections: Not on file  Intimate Partner Violence: Not on file    Family History  Problem Relation Age of Onset  . Heart failure Mother   . CAD Mother   . CAD Sister        Brain tumor  . Stroke Sister   . Cancer Father   . Stroke Sister   . Coronary artery disease Sister   . Hypertension Son   . Diabetes Son   . Obesity Son   . Sleep apnea Son   . Heart murmur Son        No major structural issues noted.    Past Medical History:  Diagnosis Date  . Arthritis    back  . Cancer [facility])    prostate  . Diabetes mellitus without complication ([place])    on meds  . Diverticulosis   . Hypercholesteremia   . Hypertension   . Meatal stenosis   . Osteoarthritis   . Sinus drainage   . Tinea pedis     Past Surgical History:  Procedure Laterality Date  . BACK SURGERY  1984, 2013   lumbar  . COLONOSCOPY  06/10/2019   Dr. Michail Sermon  . DG DILATION URETERS  2003  . EYE SURGERY Bilateral 1999   Lasik  . LEFT HEART CATH AND CORONARY ANGIOGRAPHY N/A 05/26/2018   Procedure: LEFT HEART CATH AND CORONARY ANGIOGRAPHY;  Surgeon: Nigel Mormon, MD;  Location: [facility];  Service: Cardiovascular;  Laterality: N/A;  . POSTERIOR  LUMBAR FUSION 4 LEVEL N/A 06/01/2014   Procedure: LUMBAR THREE TO FOUR, LUMBAR FOUR TO FIVE LAMINECTOMY,  RIGHT LUMBAR FUSION AT LUMBAR FOUR TO FIVE;  Surgeon: Floyce Stakes, MD;  Location: [facility];  Service: Neurosurgery;  Laterality: N/A;  POSSIBLE L2-3 L3-4 L4-5 L5-S1 POSTERIOR LUMBAR INTERBODY FUSION  . PROSTATECTOMY  2002    Current Outpatient Medications  Medication Sig Dispense Refill  . amLODipine (NORVASC) 5 MG tablet TAKE 1 TABLET BY MOUTH EVERY DAY 90 tablet 2  . aspirin 81 MG chewable tablet 1 tablet    . atorvastatin (LIPITOR) 10 MG tablet Take 10 mg by mouth daily.    . carbidopa-levodopa (SINEMET IR) 25-100 MG tablet Take 1 tablet by mouth 3 (three) times daily. 30 minutes before a meal. With water. 270 tablet 0  . Cholecalciferol 50 MCG (2000 UT) TABS Take 2,000 Units by mouth daily.    . clonazePAM (KLONOPIN) 0.5 MG tablet At bedtime prn, use 1/2 tab and if needed add second half. 30 tablet 5  . fluticasone (FLONASE) 50 MCG/ACT nasal spray Place 1 spray into both nostrils daily as needed for  rhinitis (drainage issues.).     Marland Kitchen leflunomide (ARAVA) 20 MG tablet Take 20 mg by mouth daily.    . montelukast (SINGULAIR) 10 MG tablet Take 10 mg by mouth every evening.    . primidone (MYSOLINE) 50 MG tablet Take 0.5 tablets (25 mg total) by mouth in the morning and at bedtime. 90 tablet 0  . sulfaSALAzine (AZULFIDINE) 500 MG tablet Take 1 tablet by mouth 2 (two) times daily.    Marland Kitchen SYNJARDY XR 09-998 MG TB24 Take 2 tablets by mouth daily.     Marland Kitchen triamterene-hydrochlorothiazide (MAXZIDE-25) 37.5-25 MG tablet Take 1 tablet by mouth daily.      No current facility-administered medications for this visit.    Allergies as of 08/29/2020 - Review Complete 08/29/2020  Allergen Reaction Noted  . Ace inhibitors Cough 03/07/2016    Vitals: BP 118/76   Pulse 100   Ht 5\' 10"  (1.778 m)   Wt 179 lb (81.2 kg)   BMI 25.68 kg/m  Last Weight:  Wt Readings from Last 1 Encounters:   08/29/20 179 lb (81.2 kg)   VOJ:JKKX mass index is 25.68 kg/m.     Last Height:   Ht Readings from Last 1 Encounters:  08/29/20 5\' 10"  (1.778 m)    Physical exam:  General: The patient is awake, alert and  well groomed. Head: Normocephalic, atraumatic.  Neck is supple. Mallampati 2 neck circumference:16.25,  Nasal airflow patent.   No Retrognathia - Full dentures.  Cardiovascular:  Regular rate and rhythm, without murmurs or carotid bruit, and without distended neck veins. Respiratory: Lungs are clear to auscultation. Skin:  Without evidence of edema, or rash Trunk: BMI is low. The patient's posture is still erect. He is cooperative, pleasant, has mild dysphonia.   Neurologic exam : The patient is awake and alert, oriented to place and time.   Memory subjective described as intact.  Attention span & concentration ability appears normal.  His wife also does not feel that he has memory loss. Speech is fluent, without dysarthria, mild dysphonia but no noted aphasia.  Mood and affect are appropriate, concerned.  MMSE - Mini Mental State Exam 08/29/2020 02/02/2019 07/21/2018 12/24/2017  Not completed: - (No Data) - -  Orientation to time 5 5 5 5   Orientation to Place 5 5 5 5   Registration 3 3 3 3   Attention/ Calculation 5 5 5 5   Recall 2 2 2 1   Language- name 2 objects 2 2 2 2   Language- repeat 1 1 1 1   Language- follow 3 step command 3 3 3 2   Language- Tyler & follow direction 1 1 1 1   Write a sentence 1 1 1 1   Copy design 1 1 1 1   Total score 29 29 29 27     Montreal Cognitive Assessment  08/29/2020 05/04/2020  Visuospatial/ Executive (0/5) 2 4  Naming (0/3) 2 2  Attention: Tyler list of digits (0/2) 1 1  Attention: Tyler list of letters (0/1) 0 1  Attention: Serial 7 subtraction starting at 100 (0/3) 3 3  Language: Repeat phrase (0/2) 1 0  Language : Fluency (0/1) 1 1  Abstraction (0/2) 2 2  Delayed Recall (0/5) 4 3  Orientation (0/6) 6 6  Total 22 23   We have to take the  drawing and writing off the final score due to tremor.   Cranial nerves: Preserved sense of taste but loss of smell.  Pupils are equally reactive to light. Right eye status post cataract surgery.  Extraocular movements in vertical and horizontal planes intact and without nystagmus.  Visual fields by finger perimetry are intact. Hearing to finger rub intact. Facial sensation intact to fine touch. Facial motor strength is symmetric and tongue and uvula move midline. Shoulder shrug was symmetrical.  Motor exam:  [name] presents with cog-wheeling rigidity over the right biceps only, slight increase in tone at the right wrist.  There is a right hand resting tremor and action tremor noted.there is mild resting tremor in right hand , too.   None of these findings in the left hand.He noticed a change in his handwriting and that he has to concentrate on bringing food to his mouth.  Sensory:  Fine touch, pinprick and vibration were felt normal. Coordination: Rapid alternating movements in the fingers/hands was normal.  Finger-to-nose maneuver with right hand tremor. He feels clumsy- with handwriting changes.  Gait and station: Patient walks without assistive device . He is stooped, never has been before- he tries to look at his feet while walking. He turned with 5 steps. Stance is stable and normal.   Deep tendon reflexes: in the upper and lower extremities are symmetric and intact. Babinski deferred.  The patient was advised of the nature of the diagnosed sleep disorder ( REM BD ) , the treatment options and risks for general a health and wellness arising from not treating the condition.    He now displayes a parkinsonian gait and memory concerns were also voiced.   I spent more than 25  minutes of face to face time with the patient.  I truly appreciated that Mrs. Purdue came to this visit with her husband and her input has helped greatly. She clinically clearly describes REM behavior disorder. He is  still twice a week exercising, and he does well, more strength, limber.  REM behavior disorder can be associated with neurodegenerative diseases but doesn't have to. I would like for [name] to follow [place] every 6 months to see if there is a development in terms of a less essential and more resting tremor, rigidity or memory loss. He has not shown any decrease in cognitive function.    Greater than 50% of time was spent in counseling and coordination of care. We have discussed the diagnosis and differential and I answered the patient's questions.     Assessment:  After physical and neurologic examination, review of laboratory studies,  Personal review of  polysomnography/ neurophysiology testing and pre-existing records as far as provided in visit., my assessment is   1) new resting tremor in the right hand-  TREMOR, but also right dominant cog wheeling- PD now on sinemet 25/100 mg- TID , 30 minutes before mealtime.   REM BD :   Essential tremor was diagnosed in the past, now there is a differrent quality to his tremor associated with biceps rigidity.  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 He has had some shuffling and retropulsion, propulsion, and that improved with physical therapy.  His sleep study has confirmed that he has REM behavior related enactment of visit dreams.  He was at a higher risk of converting to PD.   Melatonin at 3 mg taken 30 minutes before intended bedtime each night.  Referral to parkinson's specific PT was already done, I had referred him to  [facility] for follow up fitness under [name]- he is doing well there, benefit. .   Reducing primidone to a night time dose  only. 50-25 mg, depending on morning drowsiness.     Reduce primidone.  Will follow up alternating with NP q 6 month;  MOCA if MMSE over 26 points, please.   Asencion Partridge Aquan Kope MD  08/29/2020   CC: Tyler Hayes, [address] [place],  [place] [id number]

## 2020-08-29 NOTE — Patient Instructions (Addendum)
Tremor A tremor is trembling or shaking that you cannot control. Most tremors affect the hands or arms. Tremors can also affect the head, vocal cords, face, and other parts of the body. There are many types of tremors. Common types include:  Essential tremor. These usually occur in people older than 40. It may run in families and can happen in otherwise healthy people.  Resting tremor. These occur when the muscles are at rest, such as when your hands are resting in your lap. People with Parkinson's disease often have resting tremors.  Postural tremor. These occur when you try to hold a pose, such as keeping your hands outstretched.  Kinetic tremor. These occur during purposeful movement, such as trying to touch a finger to your nose.  Task-specific tremor. These may occur when you perform certain tasks such as writing, speaking, or standing.  Psychogenic tremor. These dramatically lessen or disappear when you are distracted. They can happen in people of all ages. Some types of tremors have no known cause. Tremors can also be a symptom of nervous system problems (neurological disorders) that may occur with aging. Some tremors go away with treatment, while others do not. Follow these instructions at home: Lifestyle  Limit alcohol intake to no more than 1 drink a day for nonpregnant women and 2 drinks a day for men. One drink equals 12 oz of beer, 5 oz of wine, or 1 oz of hard liquor.  Do not use any products that contain nicotine or tobacco, such as cigarettes and e-cigarettes. If you need help quitting, ask your health care provider.  Avoid extreme heat and extreme cold.  Limit your caffeine intake, as told by your health care provider.  Try to get 8 hours of sleep each night.  Find ways to manage your stress, such as meditation or yoga.      General instructions  Take over-the-counter and prescription medicines only as told by your health care provider.  Keep all follow-up visits  as told by your health care provider. This is important. Contact a health care provider if you:  Develop a tremor after starting a new medicine.  Have a tremor along with other symptoms such as: ? Numbness. ? Tingling. ? Pain. ? Weakness.  Notice that your tremor gets worse.  Notice that your tremor interferes with your day-to-day life. Summary  A tremor is trembling or shaking that you cannot control.  Most tremors affect the hands or arms.  Some types of tremors have no known cause. Others may be a symptom of nervous system problems (neurological disorders).  Make sure you discuss any tremors you have with your health care provider. This information is not intended to replace advice given to you by your health care provider. Make sure you discuss any questions you have with your health care provider. Document Revised: 01/28/2020 Document Reviewed: 01/28/2020 Elsevier Patient Education  2021 [employer].    To whom it may concern,  [name]. [name] has been a patient in our neurology clinic for many years, he developed Parkinson's disease and I have strongly recommended him to go through a physical exercise program geared towards patients with this neurological disorder. He has benefitted from 2 times weekly exercises for PD with a personal trainer at [facility] , [name],  who has been specialized in Parkinson's Disease. There, he has improved his gait speed, his dexterity, his strength and balance, his posture.   I consider this an important part of his therapy .  Larey Seat, MD

## 2020-08-29 NOTE — Telephone Encounter (Signed)
    08-29-2020,   To whom it may concern,  Tyler Hayes has been a patient in our neurology clinic for many years, he developed Parkinson's disease and I have strongly recommended him to go through a physical exercise program geared towards patients with this neurological disorder. He has benefitted from 2 times weekly exercises for PD with a personal trainer at [facility] , [name],  who has been specialized in Parkinson's Disease. There, he has improved his gait speed, his dexterity, his strength and balance, his posture.   I consider this an important part of his therapy .  Larey Seat, MD

## 2020-08-30 DIAGNOSIS — Z23 Encounter for immunization: Secondary | ICD-10-CM | POA: Diagnosis not present

## 2020-09-04 ENCOUNTER — Ambulatory Visit: Payer: Medicare Other

## 2020-09-04 ENCOUNTER — Other Ambulatory Visit: Payer: Self-pay

## 2020-09-04 DIAGNOSIS — I2510 Atherosclerotic heart disease of native coronary artery without angina pectoris: Secondary | ICD-10-CM

## 2020-09-04 DIAGNOSIS — R931 Abnormal findings on diagnostic imaging of heart and coronary circulation: Secondary | ICD-10-CM | POA: Diagnosis not present

## 2020-09-05 ENCOUNTER — Encounter: Payer: Self-pay | Admitting: Neurology

## 2020-09-11 NOTE — Telephone Encounter (Signed)
The pt's wife Tyler Hayes (on [place]) called stating at the last office visit they had d/w Dr Brett Fairy about seeing her specifically at the next appointment because she was following-up on a new issue. They do not want to see anyone except Dr Brett Fairy for the October appt and can consider readdressing seeing NP at that time. I switched the pt's 02/28/21 9:30 AM appt from Amy NP to Dr Brett Fairy. I also let her know the appt message had said to see NP so that is why they were scheduled with NP. Wife verbalized appreciation for the call.

## 2020-09-12 DIAGNOSIS — E118 Type 2 diabetes mellitus with unspecified complications: Secondary | ICD-10-CM | POA: Diagnosis not present

## 2020-09-12 DIAGNOSIS — I2510 Atherosclerotic heart disease of native coronary artery without angina pectoris: Secondary | ICD-10-CM | POA: Diagnosis not present

## 2020-09-12 DIAGNOSIS — E7800 Pure hypercholesterolemia, unspecified: Secondary | ICD-10-CM | POA: Diagnosis not present

## 2020-09-12 DIAGNOSIS — I10 Essential (primary) hypertension: Secondary | ICD-10-CM | POA: Diagnosis not present

## 2020-09-14 DIAGNOSIS — R918 Other nonspecific abnormal finding of lung field: Secondary | ICD-10-CM | POA: Diagnosis not present

## 2020-09-14 DIAGNOSIS — E118 Type 2 diabetes mellitus with unspecified complications: Secondary | ICD-10-CM | POA: Diagnosis not present

## 2020-09-14 DIAGNOSIS — I10 Essential (primary) hypertension: Secondary | ICD-10-CM | POA: Diagnosis not present

## 2020-09-14 DIAGNOSIS — I2584 Coronary atherosclerosis due to calcified coronary lesion: Secondary | ICD-10-CM | POA: Diagnosis not present

## 2020-09-14 DIAGNOSIS — E7800 Pure hypercholesterolemia, unspecified: Secondary | ICD-10-CM | POA: Diagnosis not present

## 2020-09-14 DIAGNOSIS — R9430 Abnormal result of cardiovascular function study, unspecified: Secondary | ICD-10-CM | POA: Diagnosis not present

## 2020-09-18 ENCOUNTER — Other Ambulatory Visit: Payer: Self-pay | Admitting: Internal Medicine

## 2020-09-18 DIAGNOSIS — R918 Other nonspecific abnormal finding of lung field: Secondary | ICD-10-CM

## 2020-09-18 DIAGNOSIS — R931 Abnormal findings on diagnostic imaging of heart and coronary circulation: Secondary | ICD-10-CM | POA: Diagnosis not present

## 2020-09-20 ENCOUNTER — Other Ambulatory Visit: Payer: Self-pay | Admitting: Cardiology

## 2020-09-20 DIAGNOSIS — I428 Other cardiomyopathies: Secondary | ICD-10-CM

## 2020-09-21 DIAGNOSIS — R768 Other specified abnormal immunological findings in serum: Secondary | ICD-10-CM | POA: Diagnosis not present

## 2020-09-21 DIAGNOSIS — Z79899 Other long term (current) drug therapy: Secondary | ICD-10-CM | POA: Diagnosis not present

## 2020-09-21 DIAGNOSIS — M0609 Rheumatoid arthritis without rheumatoid factor, multiple sites: Secondary | ICD-10-CM | POA: Diagnosis not present

## 2020-09-21 DIAGNOSIS — M1990 Unspecified osteoarthritis, unspecified site: Secondary | ICD-10-CM | POA: Diagnosis not present

## 2020-09-23 ENCOUNTER — Other Ambulatory Visit: Payer: Medicare Other

## 2020-09-27 ENCOUNTER — Ambulatory Visit: Payer: Medicare Other

## 2020-09-27 ENCOUNTER — Other Ambulatory Visit: Payer: Self-pay

## 2020-09-27 DIAGNOSIS — I428 Other cardiomyopathies: Secondary | ICD-10-CM | POA: Diagnosis not present

## 2020-10-12 ENCOUNTER — Other Ambulatory Visit: Payer: Self-pay

## 2020-10-12 ENCOUNTER — Ambulatory Visit (INDEPENDENT_AMBULATORY_CARE_PROVIDER_SITE_OTHER): Payer: Medicare Other | Admitting: Podiatry

## 2020-10-12 ENCOUNTER — Encounter: Payer: Self-pay | Admitting: Podiatry

## 2020-10-12 ENCOUNTER — Ambulatory Visit (INDEPENDENT_AMBULATORY_CARE_PROVIDER_SITE_OTHER): Payer: Medicare Other

## 2020-10-12 DIAGNOSIS — R931 Abnormal findings on diagnostic imaging of heart and coronary circulation: Secondary | ICD-10-CM

## 2020-10-12 DIAGNOSIS — M722 Plantar fascial fibromatosis: Secondary | ICD-10-CM | POA: Diagnosis not present

## 2020-10-12 MED ORDER — TRIAMCINOLONE ACETONIDE 40 MG/ML IJ SUSP
20.0000 mg | Freq: Once | INTRAMUSCULAR | Status: AC
  Administered 2020-10-12: 20 mg

## 2020-10-12 MED ORDER — MELOXICAM 7.5 MG PO TABS: 7.5000 mg | ORAL_TABLET | Freq: Every day | ORAL | 2 refills | Status: DC

## 2020-10-12 NOTE — Patient Instructions (Signed)

## 2020-10-15 NOTE — Progress Notes (Signed)
Subjective:  Patient ID: Tyler Hayes, male    DOB: 08/20/1945,  MRN: [id number] HPI Chief Complaint  Patient presents with  . Foot Pain    Plantar heel right - aching x few weeks, AM pain and after sitting for long periods, history of PF, no treatment  . New Patient (Initial Visit)    75 y.o. male presents with the above complaint.   ROS: He denies fever chills nausea vomiting muscle aches pains calf pain back pain chest pain shortness of breath.  Past Medical History:  Diagnosis Date  . Arthritis    back  . Cancer [facility])    prostate  . Diabetes mellitus without complication ([place])    on meds  . Diverticulosis   . Hypercholesteremia   . Hypertension   . Meatal stenosis   . Osteoarthritis   . Sinus drainage   . Tinea pedis    Past Surgical History:  Procedure Laterality Date  . BACK SURGERY  1984, 2013   lumbar  . COLONOSCOPY  06/10/2019   Dr. Michail Sermon  . DG DILATION URETERS  2003  . EYE SURGERY Bilateral 1999   Lasik  . LEFT HEART CATH AND CORONARY ANGIOGRAPHY N/A 05/26/2018   Procedure: LEFT HEART CATH AND CORONARY ANGIOGRAPHY;  Surgeon: Nigel Mormon, MD;  Location: [facility];  Service: Cardiovascular;  Laterality: N/A;  . POSTERIOR LUMBAR FUSION 4 LEVEL N/A 06/01/2014   Procedure: LUMBAR THREE TO FOUR, LUMBAR FOUR TO FIVE LAMINECTOMY,  RIGHT LUMBAR FUSION AT LUMBAR FOUR TO FIVE;  Surgeon: Floyce Stakes, MD;  Location: [facility];  Service: Neurosurgery;  Laterality: N/A;  POSSIBLE L2-3 L3-4 L4-5 L5-S1 POSTERIOR LUMBAR INTERBODY FUSION  . PROSTATECTOMY  2002    Current Outpatient Medications:  .  meloxicam (MOBIC) 7.5 MG tablet, Take 1 tablet (7.5 mg total) by mouth daily., Disp: 30 tablet, Rfl: 2 .  albuterol (VENTOLIN HFA) 108 (90 Base) MCG/ACT inhaler, Inhale into the lungs., Disp: , Rfl:  .  amLODipine (NORVASC) 5 MG tablet, TAKE 1 TABLET BY MOUTH EVERY DAY, Disp: 90 tablet, Rfl: 2 .  aspirin 81 MG chewable tablet, 1 tablet, Disp: ,  Rfl:  .  atorvastatin (LIPITOR) 10 MG tablet, Take 10 mg by mouth daily., Disp: , Rfl:  .  carbidopa-levodopa (SINEMET IR) 25-100 MG tablet, Take 1 tablet by mouth 3 (three) times daily. 30 minutes before a meal. With water., Disp: 270 tablet, Rfl: 3 .  Cholecalciferol 50 MCG (2000 UT) TABS, Take 2,000 Units by mouth daily., Disp: , Rfl:  .  clonazePAM (KLONOPIN) 0.5 MG tablet, At bedtime prn, use 1/2 tab and if needed add second half., Disp: 30 tablet, Rfl: 5 .  fluticasone (FLONASE) 50 MCG/ACT nasal spray, Place 1 spray into both nostrils daily as needed for rhinitis (drainage issues.). , Disp: , Rfl:  .  leflunomide (ARAVA) 20 MG tablet, Take 20 mg by mouth daily., Disp: , Rfl:  .  losartan (COZAAR) 25 MG tablet, Take 25 mg by mouth daily., Disp: , Rfl:  .  montelukast (SINGULAIR) 10 MG tablet, Take 10 mg by mouth every evening., Disp: , Rfl:  .  primidone (MYSOLINE) 50 MG tablet, Take 0.5 tablets (25 mg total) by mouth in the morning and at bedtime., Disp: 90 tablet, Rfl: 0 .  sulfaSALAzine (AZULFIDINE) 500 MG tablet, Take 1 tablet by mouth 2 (two) times daily., Disp: , Rfl:  .  SYNJARDY XR 09-998 MG TB24, Take 2 tablets by mouth  daily. , Disp: , Rfl:  .  triamterene-hydrochlorothiazide (MAXZIDE-25) 37.5-25 MG tablet, Take 1 tablet by mouth daily. , Disp: , Rfl:   Allergies  Allergen Reactions  . Other Anaphylaxis  . Ace Inhibitors Cough    LISINOPRIL   Review of Systems Objective:  There were no vitals filed for this visit.  General: Well developed, nourished, in no acute distress, alert and oriented x3   Dermatological: Skin is warm, dry and supple bilateral. Nails x 10 are well maintained; remaining integument appears unremarkable at this time. There are no open sores, no preulcerative lesions, no rash or signs of infection present.  Nails demonstrate onychodystrophy  Vascular: Dorsalis Pedis artery and Posterior Tibial artery pedal pulses are 2/4 bilateral with immedate capillary  fill time. Pedal hair growth present. No varicosities and no lower extremity edema present bilateral.   Neruologic: Grossly intact via light touch bilateral. Vibratory intact via tuning fork bilateral. Protective threshold with Semmes Wienstein monofilament intact to all pedal sites bilateral. Patellar and Achilles deep tendon reflexes 2+ bilateral. No Babinski or clonus noted bilateral.   Musculoskeletal: No gross boney pedal deformities bilateral. No pain, crepitus, or limitation noted with foot and ankle range of motion bilateral. Muscular strength 5/5 in all groups tested bilateral.  Pain on palpation medial calcaneal tubercle of the right heel.  No pain on palpation of the calf or the Achilles tendon.  Gait: Unassisted, Nonantalgic.    Radiographs:  Radiographs taken today demonstrate osseously mature individual soft tissue increase in density plantar calcaneal insertion site no acute findings are noted.  Assessment & Plan:   Assessment: Planter fasciitis and nail dystrophy.  Plan: Injected the right heel today 20 mg Kenalog 5 mg Marcaine point of maximal tenderness.  Also placed him in a plantar fascial brace and started him on meloxicam 7.5 mg I will follow-up with him in 54month in 1 month we will check the nail to see if there is enough nail to take for sample.  We will then cephalad send it for evaluation.  We did discuss appropriate shoe gear stretching exercises ice therapy and shoe gear modifications.     Shaylynne Lunt T. [place], [place]

## 2020-10-16 ENCOUNTER — Other Ambulatory Visit: Payer: Self-pay | Admitting: Neurology

## 2020-10-20 ENCOUNTER — Other Ambulatory Visit: Payer: Self-pay | Admitting: Neurology

## 2020-10-27 ENCOUNTER — Ambulatory Visit: Payer: Medicare Other | Admitting: Cardiology

## 2020-10-27 ENCOUNTER — Encounter: Payer: Self-pay | Admitting: Cardiology

## 2020-10-27 ENCOUNTER — Other Ambulatory Visit: Payer: Self-pay

## 2020-10-27 VITALS — BP 128/69 | HR 85 | Temp 98.4°F | Resp 16 | Ht 70.0 in | Wt 181.0 lb

## 2020-10-27 DIAGNOSIS — I428 Other cardiomyopathies: Secondary | ICD-10-CM | POA: Diagnosis not present

## 2020-10-27 DIAGNOSIS — R931 Abnormal findings on diagnostic imaging of heart and coronary circulation: Secondary | ICD-10-CM

## 2020-10-27 DIAGNOSIS — I2510 Atherosclerotic heart disease of native coronary artery without angina pectoris: Secondary | ICD-10-CM | POA: Diagnosis not present

## 2020-10-27 DIAGNOSIS — I10 Essential (primary) hypertension: Secondary | ICD-10-CM

## 2020-10-27 DIAGNOSIS — E782 Mixed hyperlipidemia: Secondary | ICD-10-CM

## 2020-10-27 NOTE — Progress Notes (Signed)
Patient is here for follow up visit.  Subjective:   Tyler Hayes, male    DOB: 19-May-1946, 75 y.o.   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  Total calcium score was elevated at 443, details below.  This led to further testing including nuclear stress testing and echocardiogram.  Nuclear stress testing showed mild abnormality continuation but otherwise no significant perfusion defect.  Stress LVEF was reduced at 34% with mildly dilated LV cavity both at rest and stress.  Echocardiogram showed normal LVEF, but again findings suggesting possible noncompaction myocardium.  Of note, patient has undergone cardiac MRI and genetic testing in the past.  Given his [place]: C ratio below diagnostic cutoff for LV noncompaction, it was thought to be a benign anatomic variant and genetic testing was felt not warranted.  Patient is here with his wife today.  He stays active with regular yard work as well as exercises through [employer] disease program. He denies chest pain, shortness of breath, palpitations, leg edema, orthopnea, PND, TIA/syncope.  Recently, he was started on aspirin 81 mg daily, atorvastatin dose was increased from 10 to 40 mg daily, by Dr. Shelia Media.  Since then, he has noticed erectile dysfunction.    Current Outpatient Medications on File Prior to Visit  Medication Sig Dispense Refill   albuterol (VENTOLIN HFA) 108 (90 Base) MCG/ACT inhaler Inhale into the lungs.     amLODipine (NORVASC) 5 MG tablet TAKE 1 TABLET BY MOUTH EVERY DAY 90 tablet 2   aspirin 81 MG chewable tablet 1 tablet      atorvastatin (LIPITOR) 10 MG tablet Take 10 mg by mouth daily.     carbidopa-levodopa (SINEMET IR) 25-100 MG tablet Take 1 tablet by mouth 3 (three) times daily. 30 minutes before a meal. With water. 270 tablet 3   Cholecalciferol 50 MCG (2000 UT) TABS Take 2,000 Units by mouth daily.     clonazePAM (KLONOPIN) 0.5 MG tablet At bedtime prn, use 1/2 tab and if needed add second half. 30 tablet 5   fluticasone (FLONASE) 50 MCG/ACT nasal spray Place 1 spray into both nostrils daily as needed for rhinitis (drainage issues.).      leflunomide (ARAVA) 20 MG tablet Take 20 mg by mouth daily.     losartan (COZAAR) 25 MG tablet Take 25 mg by mouth daily.     meloxicam (MOBIC) 7.5 MG tablet Take 1 tablet (7.5 mg total) by mouth daily. 30 tablet 2   montelukast (SINGULAIR) 10 MG tablet Take 10 mg by mouth every evening.     primidone (MYSOLINE) 50 MG tablet TAKE 1/2 TABLET (25 MG TOTAL) BY MOUTH IN THE MORNING AND AT BEDTIME. 90 tablet 0   sulfaSALAzine (AZULFIDINE) 500 MG tablet Take 1 tablet by mouth 2 (two) times daily.     SYNJARDY XR 09-998 MG TB24 Take 2 tablets by mouth daily.      triamterene-hydrochlorothiazide (MAXZIDE-25) 37.5-25 MG tablet Take 1 tablet by mouth daily.      No current facility-administered medications on file prior to visit.    Cardiovascular studies:  EKG 10/27/2020: Sinus  rhythm 69 bpm  T wave inversion, consider anteroseptal ischemia  Echocardiogram 09/27/2020:  Normal LV systolic function with visual EF 60-65%. Left ventricle cavity  is normal in size. Normal global wall motion. Indeterminate diastolic  filling pattern, normal LAP. Calculated EF 69%.  No significant valvular heart disease.  Findings to suggest non-compacted myocardium. Clinical correlation is  required.  Compared to study dated 05/04/2018 no significant change.    Lexiscan Tetrofosmin Stress Test  09/04/2020: Nondiagnostic ECG stress. Underlying LBBB. Myocardial perfusion is normal with mild  diaphragmatic attenuation. LV is mildly dilated both in rest and stress images. Overall LV systolic function is abnormal with septal motion consistent with LBBB. Stress LV EF: 34%. No previous exam available for comparison. High risk study due to low LVEF. Correlate LVEF with echocardiogram.  CT cardiac scoring 08/14/2020: Total score 443 (50-75th percentile) LM: 33 LAD: 9 LCx: 70 RCA: 340 PDA: 0  Abdominal Aortic Duplex 04/18/2020:  The maximum aorta (sac) diameter is 2.34 cm (mid). There is mild ectasia  in the mid abdominal aorta.  Moderate diffuse calcific plaque observed throughout the abdominal aorta.  Normal flow velocities noted in the aorta and bilateral common iliac  arteries.  No AAA noted. Consider repeat scan in 5-10 years for stability of ectasia.  Coronary angiography 05/26/2018: LM: Normal LAD: Normal LCx: Normal Ramus intermedius: Normal RCA: Mid focal 20% disease LVEDP normal   Conclusion: Minimal nonobstructive coronary artery disease Normal LVEDP  Cardiac MRI 06/18/2018: 1. Mild LVE with diffuse hypokinesis and abnormal septal motion EF 42%  2. Mild ventricular non compaction ratio using posterior wall thickness 2:1 3.  Normal RV size and function  Echocardiogram 05/04/2018: Left ventricle cavity is small. Mild concentric hypertrophy of the left ventricle. 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 There is mild hypokinesis of the the anterior, anterolateral walls. Mild decrease in LV systolic function. Doppler evidence of grade I (impaired) diastolic dysfunction, normal LAP. Visual EF is 40-45%.  Trace mitral regurgitation. Trace tricuspid regurgitation.  Recent labs: 09/14/2020: H/H 13/43. MCV 90. Platelets 175 Chol 167, TG 98, HDL 62, LDL 93   Review of Systems  Cardiovascular:  Negative for chest pain, dyspnea on exertion, leg swelling, palpitations and syncope.      Objective:    Vitals:   10/27/20 1040  BP: 128/69   Pulse: 85  Resp: 16  Temp: 98.4 F (36.9 C)  SpO2: 96%     Physical Exam Vitals and nursing note reviewed.  Constitutional:      General: He is not in acute distress. Neck:     Vascular: No JVD.  Cardiovascular:     Rate and Rhythm: Normal rate and regular rhythm.     Heart sounds: Normal heart sounds. No murmur heard. Pulmonary:     Effort: Pulmonary effort is normal.     Breath sounds: Normal breath sounds. No wheezing or rales.  Musculoskeletal:     Right lower leg: No edema.     Left lower leg: No edema.        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 No anginal symptoms at this time. Stress test with no ischemia (09/2020).  Negative stress LVEF, similar to previous study in 2020. Coronary angiogram in 2020 with no significant CAD.  It is possible that he is developed more coronary calcium deposition since then. Continue aspirin 81 mg daily. Currently on Lipitor 40 mg daily.  There is temporal correlation between increasing Lipitor dose has erectile dysfunction, side effect.  Okay to reduce Lipitor to 20 mg daily to see if symptoms improve.  Repeat lipid panel in 3 months.  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 EF 42% without any heart failure signs/symptoms. Reasonable not to use anticoagulation. 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 Repeat ultrasound in 10 years Continue statin  Hypertension: Controlled.  Type 2 DM: Continue current therapy. Managed by PCP.  F/u in 3 months  [place], MD [facility] Pager: [phone] Office: [phone] If no answer  Cell [phone]

## 2020-11-09 DIAGNOSIS — E7800 Pure hypercholesterolemia, unspecified: Secondary | ICD-10-CM | POA: Diagnosis not present

## 2020-11-16 ENCOUNTER — Ambulatory Visit (INDEPENDENT_AMBULATORY_CARE_PROVIDER_SITE_OTHER): Payer: Medicare Other | Admitting: Podiatry

## 2020-11-16 ENCOUNTER — Encounter: Payer: Self-pay | Admitting: Podiatry

## 2020-11-16 ENCOUNTER — Other Ambulatory Visit: Payer: Self-pay

## 2020-11-16 DIAGNOSIS — B351 Tinea unguium: Secondary | ICD-10-CM | POA: Diagnosis not present

## 2020-11-16 DIAGNOSIS — R931 Abnormal findings on diagnostic imaging of heart and coronary circulation: Secondary | ICD-10-CM

## 2020-11-16 DIAGNOSIS — L603 Nail dystrophy: Secondary | ICD-10-CM | POA: Diagnosis not present

## 2020-11-16 DIAGNOSIS — M722 Plantar fascial fibromatosis: Secondary | ICD-10-CM

## 2020-11-16 MED ORDER — TRIAMCINOLONE ACETONIDE 40 MG/ML IJ SUSP
20.0000 mg | Freq: Once | INTRAMUSCULAR | Status: AC
  Administered 2020-11-16: 20 mg

## 2020-11-16 NOTE — Progress Notes (Signed)
He presents today for follow-up of his right heel he states that it is doing better but there is still some soreness present.  He is also concerned about his hallux nail plate right with nail dystrophy.  Objective: Vital signs are stable alert and oriented x3.  Pulses are palpable.  He still has pain on palpation medial calcaneal tubercle of his right heel but much to the less degree than previously noted.  His hallux nail plate right does demonstrate thick yellow dystrophic onychomycotic nail with subungual debris.  Cannot rule out simple nail dystrophy.  Assessment: Nail dystrophy cannot rule out onychomycosis.  Also residual Planter fasciitis.  Plan: Reinjected the right heel today continue plantar fascial brace and anti-inflammatories.  Also took samples of the nail to be sent for pathologic evaluation.

## 2020-12-12 ENCOUNTER — Other Ambulatory Visit: Payer: Self-pay | Admitting: Neurology

## 2020-12-19 ENCOUNTER — Encounter: Payer: Self-pay | Admitting: Podiatry

## 2020-12-19 ENCOUNTER — Other Ambulatory Visit: Payer: Self-pay

## 2020-12-19 ENCOUNTER — Ambulatory Visit (INDEPENDENT_AMBULATORY_CARE_PROVIDER_SITE_OTHER): Payer: Medicare Other | Admitting: Podiatry

## 2020-12-19 DIAGNOSIS — L603 Nail dystrophy: Secondary | ICD-10-CM

## 2020-12-19 DIAGNOSIS — R931 Abnormal findings on diagnostic imaging of heart and coronary circulation: Secondary | ICD-10-CM

## 2020-12-19 DIAGNOSIS — M722 Plantar fascial fibromatosis: Secondary | ICD-10-CM | POA: Diagnosis not present

## 2020-12-19 MED ORDER — TERBINAFINE HCL 250 MG PO TABS: 250.0000 mg | ORAL_TABLET | Freq: Every day | ORAL | 0 refills | Status: DC

## 2020-12-19 NOTE — Progress Notes (Signed)
He presents today for follow-up of his right heel.  He states that is been feeling a lot better lately.  He states that sometimes there is some deep pain that is present but seems to be doing a lot better.  Objective: Vital signs are stable he is alert and oriented x3 no reproducible pain on palpation right foot.  He does however have pathology results which were positive for fungus.  Assessment: Resolving Planter fasciitis right.  Onychomycosis right foot.  Plan: Discussed etiology pathology conservative surgical therapies we discussed 3 options consisting of topical therapy laser therapy and oral therapy.  He states that he would like to try oral therapy however he like to talk to his primary doctor about this.  I sent over his first 30 pills and he is going to obtain blood work for me.  Should this blood work be abnormal we will discuss this with him in great detail otherwise I will follow-up with him in 30 days for another set of blood work if necessary.

## 2020-12-26 DIAGNOSIS — Z79899 Other long term (current) drug therapy: Secondary | ICD-10-CM | POA: Diagnosis not present

## 2020-12-26 DIAGNOSIS — M1990 Unspecified osteoarthritis, unspecified site: Secondary | ICD-10-CM | POA: Diagnosis not present

## 2020-12-26 DIAGNOSIS — M79642 Pain in left hand: Secondary | ICD-10-CM | POA: Diagnosis not present

## 2020-12-26 DIAGNOSIS — M0609 Rheumatoid arthritis without rheumatoid factor, multiple sites: Secondary | ICD-10-CM | POA: Diagnosis not present

## 2020-12-26 DIAGNOSIS — M79641 Pain in right hand: Secondary | ICD-10-CM | POA: Diagnosis not present

## 2020-12-26 DIAGNOSIS — Z13820 Encounter for screening for osteoporosis: Secondary | ICD-10-CM | POA: Diagnosis not present

## 2020-12-26 DIAGNOSIS — R768 Other specified abnormal immunological findings in serum: Secondary | ICD-10-CM | POA: Diagnosis not present

## 2021-01-02 DIAGNOSIS — M8589 Other specified disorders of bone density and structure, multiple sites: Secondary | ICD-10-CM | POA: Diagnosis not present

## 2021-01-30 DIAGNOSIS — I2584 Coronary atherosclerosis due to calcified coronary lesion: Secondary | ICD-10-CM | POA: Diagnosis not present

## 2021-01-30 DIAGNOSIS — E118 Type 2 diabetes mellitus with unspecified complications: Secondary | ICD-10-CM | POA: Diagnosis not present

## 2021-01-30 DIAGNOSIS — I10 Essential (primary) hypertension: Secondary | ICD-10-CM | POA: Diagnosis not present

## 2021-01-30 DIAGNOSIS — E7800 Pure hypercholesterolemia, unspecified: Secondary | ICD-10-CM | POA: Diagnosis not present

## 2021-02-01 DIAGNOSIS — N183 Chronic kidney disease, stage 3 unspecified: Secondary | ICD-10-CM | POA: Diagnosis not present

## 2021-02-01 DIAGNOSIS — I10 Essential (primary) hypertension: Secondary | ICD-10-CM | POA: Diagnosis not present

## 2021-02-01 DIAGNOSIS — E118 Type 2 diabetes mellitus with unspecified complications: Secondary | ICD-10-CM | POA: Diagnosis not present

## 2021-02-01 DIAGNOSIS — E7800 Pure hypercholesterolemia, unspecified: Secondary | ICD-10-CM | POA: Diagnosis not present

## 2021-02-01 DIAGNOSIS — I2584 Coronary atherosclerosis due to calcified coronary lesion: Secondary | ICD-10-CM | POA: Diagnosis not present

## 2021-02-01 DIAGNOSIS — Z23 Encounter for immunization: Secondary | ICD-10-CM | POA: Diagnosis not present

## 2021-02-06 DIAGNOSIS — E782 Mixed hyperlipidemia: Secondary | ICD-10-CM | POA: Diagnosis not present

## 2021-02-07 LAB — LIPID PANEL
Chol/HDL Ratio: 2.4 ratio (ref 0.0–5.0)
Cholesterol, Total: 154 mg/dL (ref 100–199)
HDL: 63 mg/dL (ref >39)
LDL Chol Calc (NIH): 78 mg/dL (ref 0–99)
Triglycerides: 62 mg/dL (ref 0–149)
VLDL Cholesterol Cal: 13 mg/dL (ref 5–40)

## 2021-02-12 DIAGNOSIS — Z23 Encounter for immunization: Secondary | ICD-10-CM | POA: Diagnosis not present

## 2021-02-14 ENCOUNTER — Ambulatory Visit: Payer: Medicare Other | Admitting: Cardiology

## 2021-02-14 ENCOUNTER — Other Ambulatory Visit: Payer: Self-pay

## 2021-02-14 ENCOUNTER — Encounter: Payer: Self-pay | Admitting: Cardiology

## 2021-02-14 VITALS — BP 112/74 | HR 96 | Temp 97.3°F | Ht 70.0 in | Wt 184.8 lb

## 2021-02-14 DIAGNOSIS — E782 Mixed hyperlipidemia: Secondary | ICD-10-CM | POA: Diagnosis not present

## 2021-02-14 DIAGNOSIS — I714 Abdominal aortic aneurysm, without rupture, unspecified: Secondary | ICD-10-CM

## 2021-02-14 DIAGNOSIS — I428 Other cardiomyopathies: Secondary | ICD-10-CM

## 2021-02-14 DIAGNOSIS — I2510 Atherosclerotic heart disease of native coronary artery without angina pectoris: Secondary | ICD-10-CM | POA: Diagnosis not present

## 2021-02-14 DIAGNOSIS — R931 Abnormal findings on diagnostic imaging of heart and coronary circulation: Secondary | ICD-10-CM | POA: Diagnosis not present

## 2021-02-14 DIAGNOSIS — I10 Essential (primary) hypertension: Secondary | ICD-10-CM

## 2021-02-14 NOTE — Progress Notes (Signed)
Patient is here for follow up visit.  Subjective:   Tyler Hayes, male    DOB: Oct 24, 1945, 75 y.o.   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  Total calcium score was elevated at 443, details below.  This led to further testing including nuclear stress testing and echocardiogram.  Nuclear stress testing showed mild abnormality continuation but otherwise no significant perfusion defect.  Stress LVEF was reduced at 34% with mildly dilated LV cavity both at rest and stress.  Echocardiogram showed normal LVEF, but again findings suggesting possible noncompaction myocardium.  Of note, patient has undergone cardiac MRI and genetic testing in the past.  Given his [place]: C ratio below diagnostic cutoff for LV noncompaction, it was thought to be a benign anatomic variant and genetic testing was felt not warranted.  Patient is doing well, he has increased his physical activity without any symptoms of chest pain or shortness of breath. Reviewed recent lipid panel results with the patient, details below. He is tolerating Lipitor 40 mg without any significant side effects. He has noticed easy bruising with Aspirin 81 mg daily.   Current Outpatient Medications on File Prior to Visit  Medication Sig Dispense Refill   albuterol (VENTOLIN HFA) 108 (90 Base) MCG/ACT inhaler Inhale into the lungs.     aspirin 81 MG chewable tablet 1 tablet     atorvastatin (LIPITOR) 40 MG tablet Take 20 mg by mouth daily.     carbidopa-levodopa (SINEMET IR) 25-100 MG tablet Take 1 tablet by mouth 3 (three) times daily. 30 minutes before a meal. With water. 270  tablet 3   Cholecalciferol 50 MCG (2000 UT) TABS Take 2,000 Units by mouth daily.     clonazePAM (KLONOPIN) 0.5 MG tablet At bedtime prn, use 1/2 tab and if needed add second half. 30 tablet 5   fluticasone (FLONASE) 50 MCG/ACT nasal spray Place 1 spray into both nostrils daily as needed for rhinitis (drainage issues.).      leflunomide (ARAVA) 20 MG tablet Take 20 mg by mouth daily.     losartan (COZAAR) 25 MG tablet Take 25 mg by mouth daily.     meloxicam (MOBIC) 7.5 MG tablet Take 1 tablet (7.5 mg total) by mouth daily. 30 tablet 2   montelukast (SINGULAIR) 10 MG tablet Take 10 mg by mouth every evening.     primidone (MYSOLINE) 50 MG tablet Take 0.5-1 tablets (25-50 mg total) by mouth at bedtime. 90 tablet 0   SYNJARDY XR 09-998 MG TB24 Take 2 tablets by mouth daily.      terbinafine (LAMISIL) 250 MG tablet Take 1 tablet (250 mg total) by mouth daily. 30 tablet 0   triamterene-hydrochlorothiazide (MAXZIDE-25) 37.5-25 MG tablet Take 1 tablet by mouth daily.      No current facility-administered medications on file prior to visit.    Cardiovascular studies:  EKG 10/27/2020: Sinus rhythm 69 bpm  T wave inversion, consider anteroseptal ischemia  Echocardiogram 09/27/2020:  Normal LV systolic function with visual EF 60-65%. Left ventricle cavity  is normal in size. Normal global wall motion. Indeterminate diastolic  filling pattern, normal LAP. Calculated EF 69%.  No significant  valvular heart disease.  Findings to suggest non-compacted myocardium. Clinical correlation is  required.  Compared to study dated 05/04/2018 no significant change.    Lexiscan Tetrofosmin Stress Test  09/04/2020: Nondiagnostic ECG stress. Underlying LBBB. Myocardial perfusion is normal with mild diaphragmatic attenuation. LV is mildly dilated both in rest and stress images. Overall LV systolic function is abnormal with septal motion consistent with LBBB. Stress LV EF: 34%. No previous exam available for  comparison. High risk study due to low LVEF. Correlate LVEF with echocardiogram.  CT cardiac scoring 08/14/2020: Total score 443 (50-75th percentile) LM: 33 LAD: 9 LCx: 70 RCA: 340 PDA: 0  Abdominal Aortic Duplex 04/18/2020:  The maximum aorta (sac) diameter is 2.34 cm (mid). There is mild ectasia  in the mid abdominal aorta.  Moderate diffuse calcific plaque observed throughout the abdominal aorta.  Normal flow velocities noted in the aorta and bilateral common iliac  arteries.  No AAA noted. Consider repeat scan in 5-10 years for stability of ectasia.  Coronary angiography 05/26/2018: LM: Normal LAD: Normal LCx: Normal Ramus intermedius: Normal RCA: Mid focal 20% disease LVEDP normal   Conclusion: Minimal nonobstructive coronary artery disease Normal LVEDP  Cardiac MRI 06/18/2018: 1. Mild LVE with diffuse hypokinesis and abnormal septal motion EF 42%  2. Mild ventricular non compaction ratio using posterior wall thickness 2:1 3.  Normal RV size and function  Echocardiogram 05/04/2018: Left ventricle cavity is small. Mild concentric hypertrophy of the left ventricle. 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 There is mild hypokinesis of the the anterior, anterolateral walls. Mild decrease in LV systolic function. Doppler evidence of grade I (impaired) diastolic dysfunction, normal LAP. Visual EF is 40-45%.  Trace mitral regurgitation. Trace tricuspid regurgitation.  Recent labs: 02/06/2021: Chol 154, TG 62, HDL 63, LDL 78  09/14/2020: H/H 13/43. MCV 90. Platelets 175 Chol 167, TG 98, HDL 62, LDL 93   Review of Systems  Cardiovascular:  Negative for chest pain, dyspnea on exertion, leg swelling, palpitations and syncope.      Objective:    Vitals:   02/14/21 1055  BP: 112/74  Pulse: 96  Temp: (!) 97.3 F (36.3 C)  SpO2: 95%     Physical Exam Vitals and nursing note reviewed.  Constitutional:      General: He is not in acute  distress. Neck:     Vascular: No JVD.  Cardiovascular:     Rate and Rhythm: Normal rate and regular rhythm.     Heart sounds: Normal heart sounds. No murmur heard. Pulmonary:     Effort: Pulmonary effort is normal.     Breath sounds: Normal breath sounds. No wheezing or rales.  Musculoskeletal:     Right lower leg: No edema.     Left lower leg: No edema.        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 No anginal symptoms at this time. Stress test with no ischemia (09/2020).  Negative stress LVEF, similar to previous study in 2020. Coronary angiogram in 2020 with no significant CAD. It is possible that he is developed more coronary calcium deposition since then. Continue aspirin 81 mg daily. LDL 78, HDL 62 on Lipitor 20 mg. He wants to continue the same dose for now and continue increasing physical  activity. Okay with me. Repeat lipid panel with Dr. Shelia Media in 06/2021.    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 EF 42% without any heart failure signs/symptoms. Reasonable not to use anticoagulation.   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 Repeat ultrasound in 2030 Continue statin  Hypertension: Controlled.  Type 2 DM: Continue current therapy. Managed by PCP.  F/u in 6 months  Aiya Keach [place], MD [facility] Pager: [phone] Office: [phone] If no answer Cell [phone]

## 2021-02-27 DIAGNOSIS — H52203 Unspecified astigmatism, bilateral: Secondary | ICD-10-CM | POA: Diagnosis not present

## 2021-02-27 DIAGNOSIS — Z961 Presence of intraocular lens: Secondary | ICD-10-CM | POA: Diagnosis not present

## 2021-02-27 DIAGNOSIS — E119 Type 2 diabetes mellitus without complications: Secondary | ICD-10-CM | POA: Diagnosis not present

## 2021-02-28 ENCOUNTER — Encounter: Payer: Self-pay | Admitting: Neurology

## 2021-02-28 ENCOUNTER — Ambulatory Visit: Payer: Medicare Other | Admitting: Family Medicine

## 2021-02-28 ENCOUNTER — Ambulatory Visit (INDEPENDENT_AMBULATORY_CARE_PROVIDER_SITE_OTHER): Payer: Medicare Other | Admitting: Neurology

## 2021-02-28 DIAGNOSIS — G20 Parkinson's disease: Secondary | ICD-10-CM

## 2021-02-28 DIAGNOSIS — G4752 REM sleep behavior disorder: Secondary | ICD-10-CM

## 2021-02-28 DIAGNOSIS — R931 Abnormal findings on diagnostic imaging of heart and coronary circulation: Secondary | ICD-10-CM

## 2021-02-28 DIAGNOSIS — R490 Dysphonia: Secondary | ICD-10-CM | POA: Diagnosis not present

## 2021-02-28 DIAGNOSIS — G3184 Mild cognitive impairment, so stated: Secondary | ICD-10-CM | POA: Insufficient documentation

## 2021-02-28 DIAGNOSIS — R269 Unspecified abnormalities of gait and mobility: Secondary | ICD-10-CM | POA: Diagnosis not present

## 2021-02-28 NOTE — Patient Instructions (Addendum)
Clonazepam Tablets What is this medication? CLONAZEPAM (kloe NA ze pam) treats seizures. It may also be used to treat panic disorder. It works by Child psychotherapist system calm down. It belongs to a group of medications called benzodiazepines. This medicine may be used for other purposes; ask your health care provider or pharmacist if you have questions. COMMON BRAND NAME(S): Ceberclon, Klonopin What should I tell my care team before I take this medication? They need to know if you have any of these conditions: An alcohol or drug abuse problem Bipolar disorder, depression, psychosis or other mental health condition Glaucoma Kidney or liver disease Lung or breathing disease Myasthenia gravis Parkinson disease Porphyria Seizures or a history of seizures Suicidal thoughts An unusual or allergic reaction to clonazepam, other benzodiazepines, foods, dyes, or preservatives Pregnant or trying to get pregnant Breast-feeding How should I use this medication? Take this medication by mouth with a glass of water. Follow the directions on the prescription label. If it upsets your stomach, take it with food or milk. Take your medication at regular intervals. Do not take it more often than directed. Do not stop taking or change the dose except on the advice of your care team. A special MedGuide will be given to you by the pharmacist with each prescription and refill. Be sure to read this information carefully each time. Talk to your care team regarding the use of this medication in children. Special care may be needed. Overdosage: If you think you have taken too much of this medicine contact a poison control center or emergency room at once. NOTE: This medicine is only for you. Do not share this medicine with others. What if I miss a dose? If you miss a dose, take it as soon as you can. If it is almost time for your next dose, take only that dose. Do not take double or extra doses. What may interact  with this medication? Do not take this medication with any of the following: Narcotic medications for cough Sodium oxybate This medication may also interact with the following: Alcohol Antihistamines for allergy, cough and cold Antiviral medications for HIV or AIDS Certain medications for anxiety or sleep Certain medications for depression, like amitriptyline, fluoxetine, sertraline Certain medications for fungal infections like ketoconazole and itraconazole Certain medications for seizures like carbamazepine, phenobarbital, phenytoin, primidone General anesthetics like halothane, isoflurane, methoxyflurane, propofol Local anesthetics like lidocaine, pramoxine, tetracaine Medications that relax muscles for surgery Narcotic medications for pain Phenothiazines like chlorpromazine, mesoridazine, prochlorperazine, thioridazine This list may not describe all possible interactions. Give your health care provider a list of all the medicines, herbs, non-prescription drugs, or dietary supplements you use. Also tell them if you smoke, drink alcohol, or use illegal drugs. Some items may interact with your medicine. What should I watch for while using this medication? Tell your care team if your symptoms do not start to get better or if they get worse. Do not stop taking except on your care team's advice. You may develop a severe reaction. Your care team will tell you how much medication to take. You may get drowsy or dizzy. Do not drive, use machinery, or do anything that needs mental alertness until you know how this medication affects you. To reduce the risk of dizzy and fainting spells, do not stand or sit up quickly, especially if you are an older patient. Alcohol may increase dizziness and drowsiness. Avoid alcoholic drinks. If you are taking another medication that also causes drowsiness, you may  have more side effects. Give your care team a list of all medications you use. Your care team will tell  you how much medication to take. Do not take more medication than directed. Call emergency services if you have problems breathing or unusual sleepiness. The use of this medication may increase the chance of suicidal thoughts or actions. Pay special attention to how you are responding while on this medication. Any worsening of mood, or thoughts of suicide or dying should be reported to your care team right away. What side effects may I notice from receiving this medication? Side effects that you should report to your care team as soon as possible: Allergic reactions-skin rash, itching, hives, swelling of the face, lips, tongue, or throat CNS depression-slow or shallow breathing, shortness of breath, feeling faint, dizziness, confusion, trouble staying awake Thoughts of suicide or self-harm, worsening mood, feelings of depression Side effects that usually do not require medical attention (report to your care team if they continue or are bothersome): Dizziness Drowsiness Headache This list may not describe all possible side effects. Call your doctor for medical advice about side effects. You may report side effects to FDA at 1-800-FDA-1088. Where should I keep my medication? Keep out of the reach of children and pets. This medication can be abused. Keep your medication in a safe place to protect it from theft. Do not share this medication with anyone. Selling or giving away this medication is dangerous and against the law. Store at room temperature between 15 and 30 degrees C (59 and 86 degrees F). Protect from light. Keep container tightly closed. This medication may cause accidental overdose and death if taken by other adults, children, or pets. Mix any unused medication with a substance like cat litter or coffee grounds. Then throw the medication away in a sealed container like a sealed bag or a coffee can with a lid. Do not use the medication after the expiration date. NOTE: This sheet is a  summary. It may not cover all possible information. If you have questions about this medicine, talk to your doctor, pharmacist, or health care provider.  2022 Elsevier/Gold Standard (2020-06-02 12:20:04)    Parkinson's Disease Parkinson's disease is a type of movement disorder. It is a long-term condition that gets worse over time (is progressive). Each person with Parkinson's disease is affected differently. This condition limits your ability to control movements and move your body normally. The condition can range from mild to severe. Parkinson's disease tends to get worse slowly over several years. What are the causes? Parkinson's disease results from a loss of brain cells (neurons) that make a brain chemical called dopamine. Dopamine is needed to control movement. As the condition gets worse, neurons make less dopamine. This makes it hard to move or control your movements. The exact cause of the loss of neurons and why they make less dopamine is not known. Factors related to genes and the environment may contribute to the cause of Parkinson's disease. What increases the risk? The following factors may make you more likely to develop this condition: Being male. Being age 28 or older. Having a family history of Parkinson's disease. Having had a traumatic brain injury. Having experienced depression. Having been exposed to toxins, such as pesticides. What are the signs or symptoms? Symptoms of this condition can vary. The main symptoms are related to movement. These include: A tremor or shaking while you are resting that you cannot control. Stiffness in your neck, arms, and legs (rigidity). Slowing  of movement. You may lose facial expressions and have trouble making small movements that are needed to button clothing or brush your teeth. An abnormal walk. You may walk with short, shuffling steps. Loss of balance and stability when standing. You may sway, fall backward, and have trouble  making turns. Other symptoms include: Mental or cognitive changes including depression, anxiety, having false beliefs (delusions), or seeing, hearing, or feeling things that do not exist (hallucinations). Trouble speaking or swallowing. Changes in bowel or bladder functions including constipation, having to go urgently or frequently, or not being able to control your bowel or bladder. Changes in sleep habits or trouble sleeping. Parkinson's disease may be graded by severity of your condition as mild, moderate, or advanced. Parkinson's disease progression is different for everyone. You may not progress to the advanced stage. Mild Parkinson's disease involves: Movement problems that do not affect daily activities. Movement problems on one side of the body. Moderate Parkinson's disease involves: Movement problems on both sides of the body. Slowing of movement. Coordination and balance problems. Advanced Parkinson's disease involves: Extreme difficulty walking. Inability to live alone safely. Signs of dementia, such as having trouble remembering things, doing daily tasks such as getting dressed, and problem solving. How is this diagnosed? This condition is diagnosed by a specialist. A diagnosis may be made based on symptoms, your medical history, and a physical exam. You may also have brain imaging tests to check for a loss of dopamine-producing areas of the brain. How is this treated? There is no cure for Parkinson's disease. Treatment focuses on managing your symptoms. Treatment may include: Medicines. Everyone responds to medicines differently. Your response may change over time. Work with your health care provider to find the best medicines for you. Speech, occupational, and physical therapy. Deep brain stimulation surgery to reduce tremors and other involuntary movements. Follow these instructions at home: Medicines Take over-the-counter and prescription medicines only as told by your  health care provider. Avoid taking medicines that can affect thinking, such as pain or sleeping medicines. Eating and drinking Follow instructions from your health care provider about eating or drinking restrictions. Do not drink alcohol. Activity Talk with your health care provider about if it is safe for you to drive. Do exercises as told by your health care provider or physical therapist. Lifestyle   Install grab bars and railings in your home to prevent falls. Do not use any products that contain nicotine or tobacco, such as cigarettes, e-cigarettes, and chewing tobacco. If you need help quitting, ask your health care provider. Consider joining a support group for people with Parkinson's disease. General instructions Work with your health care provider to determine what you need help with and what your safety needs are. Keep all follow-up visits as told by your health care provider, including any visits with a physical therapist, speech therapist, or occupational therapist. This is important. Contact a health care provider if: Medicines do not help your symptoms. You are unsteady or have fallen at home. You need more support to function well at home. You have trouble swallowing. You have severe constipation. You are having problems with side effects from your medicines. You feel confused, anxious, or depressed. Get help right away if you: Are injured after a fall. See or hear things that are not real. Cannot swallow without choking. Have chest pain or trouble breathing. Do not feel safe at home. Have thoughts about hurting yourself or others. If you ever feel like you may hurt yourself or others,  or have thoughts about taking your own life, get help right away. You can go to your nearest emergency department or call: Your local emergency services (911 in the [place] A suicide crisis helpline, such as the [place] at (630) 050-6452. This is open 24 hours  a day. Summary Parkinson's disease is a long-term condition that gets worse over time. This condition limits your ability to control your movements and move your body normally. There is no cure for Parkinson's disease. Treatment focuses on managing your symptoms. Work with your health care provider to determine what you need help with and what your safety needs are. Keep all follow-up visits as told by your health care provider, including any visits with a physical therapist, speech therapist, or occupational therapist. This is important. This information is not intended to replace advice given to you by your health care provider. Make sure you discuss any questions you have with your health care provider. Document Revised: 07/23/2018 Document Reviewed: 07/23/2018 [employer] Patient Education  [employer].

## 2021-02-28 NOTE — Progress Notes (Signed)
SLEEP MEDICINE CLINIC   Provider:  Larey Seat, MD  Referring Provider: Deland Pretty, MD Primary Care Physician:  Deland Pretty, MD    HPI: 02-28-2021 Tyler Hayes is a 75 y.o. 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 He is also followed by Dr Virgina Jock.  He has a slight vocal tremor, and no dysphagia.   He has more tremor. Notably more tremor in his handwriting. Tyler Hayes report some inner restlessness, may be some anxiety. No incontinence.   Had REM BD for many years. Sleep talking is evident but his spouse has still noted some screaming bumping and kicking. Has now bed rails, he is not falling out of bed.  Continues with [name] exercise regimen.    MMSE - Mini Mental State Exam 02/28/2021 08/29/2020 02/02/2019 07/21/2018 12/24/2017  Not completed: - - (No Data) - -  Orientation to time 5 5 5 5 5   Orientation to Place 5 5 5 5 5   Registration 3 3 3 3 3   Attention/ Calculation 3- replaced with WORLD, 5/5  5 5 5 5   Recall 2 2 2 2 1   Language- name 2 objects 2 2 2 2 2   Language- repeat 1 1 1 1 1   Language- follow 3 step command 2 3 3 3 2   Language- read & follow direction 1 1 1 1 1   Write a sentence 1 1 1 1 1   Copy design 1 1 1 1 1   Total score 28 29 29 29 27     Good , steady memory results.       08-29-2020, pleasure of meeting  Mr. and Mrs. Hayes and we are looking at the results of his Montreal cognitive assessment test on his M and S Mini-Mental Status Examination on the MMSE the patient scored 29 out of 30 points on the [facility] cognitive assessment he scored 22 and it would be 22 out of 30 points based on impairment of fine motor skills that make it harder for him to write to draw or to connect the dots on the visual spatial executive function test he did well on the Trail making he created a clock face ( Dali-Style)  but the hands of the clock and the numbers were clearly arranged as they should  be.   He would be able to name 2 of the 3 animals he only recalled 1 out of 5 words but he was fully oriented and his serial sevens abstraction was intact 2.  So I think that this is basically a short-term memory loss and some degree of word finding delay he created after he named 11 animals on the Speed test which is just the border to make a point in this department.  He did very well with repeating a simple sentence but the longer convoluted sentences on the [facility] cognitive assessment were harder.  ~I basically think this is short term memory loss impairment and it would also be to some degree an attention problem.  He was started on sinemet last visit, does well, no visual hallucinations. He is on primidone. 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             05-04-2020, CD The patient has been originally referred from Dr Shelia Media after fall out of bed and had at the time carried already an diagnosis  of essential tremor. With the dx of REM BD there was a high chance of converting to PD, and we planned follow his memory with Lynn regularly. He started on melatonin and low dose Klonopin. Klonopin was d/c in the wake of feeling groggy. Tyler Hayes traveled to [place] on labor day Aug 08, 2019- earlier this year- where he suffered a fall after feeling SOB, weak and malaised. He was diagnosed with community acquired pneumonia and admitted to a community hospital in [place], his gait was festinating, shuffling, and he chased his point of gravity, getting faster and faster downhill, he fell forward, flat-  After his observation in hospital he was diagnosed with PD- as we had been concerned. His memory has declined, is delayed. He feels internally a tremor and yet stiffness in his legs. His right hand is clumsier, handwriting has changed. Sinemet 25/ 100 mg was started tid and reduced the tremor.  Memory concerns remain.   MRI brain  in [place]- 01-24-2020,  COMPARISON: None.  FINDINGS: Mild diffuse atrophy. There is moderate ventriculomegaly with a decreased callososeptal angle. Remote lacunar infarct in the left basal ganglia. Minimal patchy white matter hypoattenuation. No finding of acute ischemia, hemorrhage, or mass effect. Calvarium is unremarkable. Visualized paranasal sinuses and mastoid air cells are clear.  IMPRESSION: 1. Moderate ventriculomegaly slightly out of proportion to the degree of atrophy with a decreased callososeptal angle raises the possibility of normal pressure hydrocephalus. Recommend clinical correlation. 2. No finding of acute ischemia or hemorrhage. 3. Mild atrophy and chronic small vessel ischemic changes. Remote lacunar infarct in the left basal ganglia.  Electronically Signed By: Yancey Flemings M.D. On: 01/24/2020 9:12:45 PM On: [id number]    EKG (2015)-sinus tachycardia 108 beats per minute; nonspecific interventricular conduction block with QRS of 128 milliseconds; nonspecific ST and T-wave abnormality; no STEMI. 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 Tyler Hayes reports that he acted out dreams sometimes with kicking more swaying even in his 89s. He is now in his second marriage but he remembers that his first wife was complaining about his abrupt movements and  sometimes kicking her or bruising her. She died of leukemia in 08-07-2000 years ago.  He also reports that he used to be physically so much more active before he retired that he felt he got deeper and more restorative sleep simply because he was physically in need of sleep. He believes that acting out dreams has increased since his retirement, which began in August 08, 2011. He used to be a Holiday representative.  Sleep habits are as follows: He likes to retreat to the bedroom around 10 PM, relaxes there , reads and plays solitaire. Around 11:30 PM is when he usually initiate sleep. He has discovered that he doesn't sleep well if he watches TV close to bedtime and therefore eliminated this.  He shares a bedroom with his wife, the bedroom is cool ,quiet and dark, he sleeps on multiple pillows.He also reports that he most nights he sleeps well through the night but sometimes he has very vivid dreams and he tends to sleep on the right side, and his acting out of dreams is also correlated to sleeping on the right side. He avoids the supine sleep position as it causes his choking on sinus drainage. Acting out his dreams seems to start later than an hour into sleep and may last well into the morning hours usually he has only one episode of dream enactment and not multiple. Usually his dreams include being threatened, followed, protecting himself or his wife. Sometimes he feels that sometimes he tries to bite his legs fand he will flex and inadvertently kicked her. His wife will try to wake him up. He has left the bed . He is trembling and she will call out and wake him. If he goes back to sleep right away sometimes the dream will continue with the same incident.Sleep related medical history/ Family history:   Father had a tremor, died of a brain tumor while patient was in high school.   Social history:  Retired  Re -married, adult 2 sons and one step son , adult children, he quit smoking in 1992 , after  50 yeas, and 20 pack  years. ETOH;  Seldom,  caffeine :2-3 cups in AM, I one glass of iced tea in PM,and 3 cokes a day.    Interval history from 06/26/2016, I have pleasure of seeing Mr. and Mrs. Hayes today following a sleep study from 04/21/2016 the patient had no significant apnea, his AHI was 2.3 his REM AHI was 5.7 which is still considered low. He did not have any measurable apnea if he didn't sleep on his back. 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  Tyler Hayes was last seen on 23 December 2016 by our nurse practitioner Vaughan Browner, and he is here today for a 6 months interval revisit on 25 June 2017.  Tyler Hayes reports that he still takes daytime naps but his residual sleepiness during the day has been manageable.  He endorsed the Epworth Sleepiness Scale at 11 points which is just a little above average.  Fatigue was only endorsed at 16 points.  In his sleep study but took place exactly a year ago there was no significant sleep apnea noted.  He did have REM sleep behavior during the study- and his wife is reporting still active sleep behavior. He feels is less severe, but it is still a treat to his wife.  She has noticed that the dream behavior can occur several times in the same night, every night of the week. He sits up, screams and kicks. He seems to fight.  Lasts 2 minutes or less. Last night he was crying, and moving. He has fallen out of bed. He uses a bed rail, and still fell out. Melatonin has helped but not eliminated the REM BD.   Zaevion Parke is a 75 y.o. male , seen here as a revisit from Dr. 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 His wife reports sleep talking every  night, but not as much  kicking, thrashing. He has last month fallen-  Out of the bed and hit his head.  His wife thinks he takes  Klonopin less than 2 a month. He otherwise feels he controls REM BD with melatonin. We also added a MMSE today, and he scored 29/ 30 points! Very good.   Review of Systems: Out of a complete 14 system review, the patient complains of only the following symptoms, and all other reviewed systems are negative.  Good memory, better control in REM BD, yelling, kicking thrashing.  Primidone and sinemet . REM BD. Some kicking, thrashing, yelling.  Less EDS with 7 points in Epworth-  but with naps in daytime.  FSS at 10 from 16 Points.  Tv watching , but not in bedroom- a likes action TV and seems to enact these shows, dreams about them.   He sleep talks now but had no motor enactment as witnessed by wife, however he fell out of bed in the last 14 days .    Social History   Socioeconomic History   Marital status: Married    Spouse name: Not on file   Number of children: 3   Years of education: Not on file   Highest education level: Not on file  Occupational History   Not on file  Tobacco Use   Smoking status: Former    Packs/day: 1.50    Years: 20.00    Pack years: 30.00    Types: Cigarettes    Quit date: 02/01/1991    Years since quitting: 30.0   Smokeless tobacco: Never  Vaping Use   Vaping Use: Never used  Substance and Sexual Activity   Alcohol use: Yes    Comment: occasionally   Drug use: No   Sexual activity: Not on file  Other Topics Concern   Not on file  Social History Narrative   Not on file   Social Determinants of Health   Financial Resource Strain: Not on file  Food Insecurity: Not on file  Transportation Needs: Not on file  Physical Activity: Not on file  Stress: Not on file  Social Connections: Not on file  Intimate Partner Violence: Not on file    Family History  Problem Relation Age of Onset   Heart failure Mother    CAD Mother    CAD Sister         Brain tumor   Stroke Sister    Cancer Father    Stroke Sister    Coronary artery disease Sister    Hypertension Son    Diabetes Son    Obesity Son    Sleep apnea Son    Heart murmur Son        No major structural issues noted.    Past Medical History:  Diagnosis Date   Arthritis    back   Cancer [facility])    prostate   Diabetes mellitus without complication ([place])    on meds   Diverticulosis    Hypercholesteremia    Hypertension    Meatal stenosis    Osteoarthritis    Sinus drainage    Tinea pedis     Past Surgical History:  Procedure Laterality Date   BACK SURGERY  1984, 2013   lumbar   COLONOSCOPY  06/10/2019   Dr. Michail Sermon   DG DILATION URETERS  2003   EYE SURGERY Bilateral 1999   Lasik   LEFT HEART CATH AND CORONARY ANGIOGRAPHY N/A 05/26/2018   Procedure:  LEFT HEART CATH AND CORONARY ANGIOGRAPHY;  Surgeon: Nigel Mormon, MD;  Location: [facility];  Service: Cardiovascular;  Laterality: N/A;   POSTERIOR LUMBAR FUSION 4 LEVEL N/A 06/01/2014   Procedure: LUMBAR THREE TO FOUR, LUMBAR FOUR TO FIVE LAMINECTOMY,  RIGHT LUMBAR FUSION AT LUMBAR FOUR TO FIVE;  Surgeon: Floyce Stakes, MD;  Location: [facility];  Service: Neurosurgery;  Laterality: N/A;  POSSIBLE L2-3 L3-4 L4-5 L5-S1 POSTERIOR LUMBAR INTERBODY FUSION   PROSTATECTOMY  2002    Current Outpatient Medications  Medication Sig Dispense Refill   albuterol (VENTOLIN HFA) 108 (90 Base) MCG/ACT inhaler Inhale into the lungs.     aspirin 81 MG chewable tablet 1 tablet     atorvastatin (LIPITOR) 40 MG tablet Take 40 mg by mouth daily.     carbidopa-levodopa (SINEMET IR) 25-100 MG tablet Take 1 tablet by mouth 3 (three) times daily. 30 minutes before a meal. With water. 270 tablet 3   Cholecalciferol 50 MCG (2000 UT) TABS Take 2,000 Units by mouth daily.     clonazePAM (KLONOPIN) 0.5 MG tablet At bedtime prn, use 1/2 tab and if needed add second half. 30 tablet 5   fluticasone (FLONASE) 50 MCG/ACT  nasal spray Place 1 spray into both nostrils daily as needed for rhinitis (drainage issues.).      leflunomide (ARAVA) 20 MG tablet Take 20 mg by mouth daily.     losartan (COZAAR) 25 MG tablet Take 25 mg by mouth daily.     montelukast (SINGULAIR) 10 MG tablet Take 10 mg by mouth every evening.     primidone (MYSOLINE) 50 MG tablet Take 0.5-1 tablets (25-50 mg total) by mouth at bedtime. (Patient taking differently: Take 25 mg by mouth 2 (two) times daily.) 90 tablet 0   SYNJARDY XR 09-998 MG TB24 Take 2 tablets by mouth daily.      triamterene-hydrochlorothiazide (MAXZIDE-25) 37.5-25 MG tablet Take 1 tablet by mouth daily.      No current facility-administered medications for this visit.    Allergies as of 02/28/2021 - Review Complete 02/28/2021  Allergen Reaction Noted   Other Anaphylaxis 01/24/2020   Antihistamines, diphenhydramine-type  09/21/2020   Ace inhibitors Cough 03/07/2016    Vitals: There were no vitals taken for this visit. Last Weight:  Wt Readings from Last 1 Encounters:  02/14/21 184 lb 12.8 oz (83.8 kg)   YTK:PTWSF is no height or weight on file to calculate BMI.     Last Height:   Ht Readings from Last 1 Encounters:  02/14/21 5\' 10"  (1.778 m)    Physical exam:  General: The patient is awake, alert and  well groomed. Head: Normocephalic, atraumatic.  Neck is supple. Mallampati 2 neck circumference:16.25,  Nasal airflow patent.   No Retrognathia - Full dentures.  Cardiovascular:  Regular rate and rhythm, without murmurs or carotid bruit, and without distended neck veins. Respiratory: Lungs are clear to auscultation. Skin:  Without evidence of edema, or rash Trunk: BMI is low. The patient's posture is still erect. He is cooperative, pleasant, has mild dysphonia.   Neurologic exam : The patient is awake and alert, oriented to place and time.   Memory subjective described as intact.  Attention span & concentration ability appears normal.  His wife also does  not feel that he has memory loss. Speech is fluent, without dysarthria, mild dysphonia but no noted aphasia.  Mood and affect are appropriate, concerned.  MMSE - Mini Mental State Exam 02/28/2021 08/29/2020 02/02/2019  07/21/2018 12/24/2017  Not completed: - - (No Data) - -  Orientation to time 5 5 5 5 5   Orientation to Place 5 5 5 5 5   Registration 3 3 3 3 3   Attention/ Calculation 3 5 5 5 5   Recall 2 2 2 2 1   Language- name 2 objects 2 2 2 2 2   Language- repeat 1 1 1 1 1   Language- follow 3 step command 2 3 3 3 2   Language- read & follow direction 1 1 1 1 1   Write a sentence 1 1 1 1 1   Copy design 1 1 1 1 1   Total score 26 29 29 29 27     Montreal Cognitive Assessment  08/29/2020 05/04/2020  Visuospatial/ Executive (0/5) 2 4  Naming (0/3) 2 2  Attention: Read list of digits (0/2) 1 1  Attention: Read list of letters (0/1) 0 1  Attention: Serial 7 subtraction starting at 100 (0/3) 3 3  Language: Repeat phrase (0/2) 1 0  Language : Fluency (0/1) 1 1  Abstraction (0/2) 2 2  Delayed Recall (0/5) 4 3  Orientation (0/6) 6 6  Total 22 23   We have to take the drawing and writing off the final score due to tremor.   Cranial nerves: Preserved sense of taste but loss of smell.  Pupils are equally reactive to light. Right eye status post cataract surgery. Extraocular movements in vertical and horizontal planes intact and without nystagmus.  Visual fields by finger perimetry are intact. Hearing to finger rub intact. Facial sensation intact to fine touch. Facial motor strength is symmetric and tongue and uvula move midline. Shoulder shrug was symmetrical.  Motor exam:  Tyler Hayes presents with cog-wheeling rigidity over the right biceps only, slight increase in tone at the right wrist.  There is a right hand resting tremor and action tremor noted.there is mild resting tremor in right hand , too.   None of these findings in the left hand.He noticed a change in his handwriting and that he has to  concentrate on bringing food to his mouth.  Sensory:  Fine touch, pinprick and vibration were felt normal. Coordination: Rapid alternating movements in the fingers/hands was normal.  Finger-to-nose maneuver with right hand tremor. He feels clumsy- with handwriting changes.  Gait and station: Patient walks without assistive device . He is stooped, never has been before- he tries to look at his feet while walking. He turned with 5 steps. Stance is stable and normal.   Deep tendon reflexes: in the upper and lower extremities are symmetric and intact. Babinski deferred.  The patient was advised of the nature of the diagnosed sleep disorder ( REM BD ) , the treatment options and risks for general a health and wellness arising from not treating the condition.    He now displayes a parkinsonian gait and memory concerns were also voiced.   I spent more than 25  minutes of face to face time with the patient.  I truly appreciated that Mrs. Hayes came to this visit with her husband and her input has helped greatly. She clinically clearly describes REM behavior disorder. He is still twice a week exercising, and he does well, more strength, limber.  REM behavior disorder can be associated with neurodegenerative diseases but doesn't have to. I would like for Tyler Hayes to follow [place] every 6 months to see if there is a development in terms of a less essential and more resting tremor, rigidity or memory loss.  He has not shown any decrease in cognitive function.    Greater than 50% of time was spent in counseling and coordination of care. We have discussed the diagnosis and differential and I answered the patient's questions.     Assessment:  After physical and neurologic examination, review of laboratory studies,  Personal review of  polysomnography/ neurophysiology testing and pre-existing records as far as provided in visit., my assessment is   1) new resting tremor in the right hand-  TREMOR, but also right  dominant cog wheeling- PD now on sinemet 25/100 mg- TID , 30 minutes before mealtime.   REM BD :   Essential tremor was diagnosed in the past, now there is a differrent quality to his tremor associated with biceps rigidity.  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 He has had some shuffling and retropulsion, propulsion, and that improved with physical therapy.  His sleep study has confirmed that he has REM behavior related enactment of visit dreams.  He was at a higher risk of converting to PD.   Melatonin at 3 mg taken 30 minutes before intended bedtime has not helped. Referral to parkinson's specific PT was already done,   Continues with [facility] for follow up fitness under [name]- he is doing well there, benefit. He no longer feels like  he is shuffling, like magnetic feet. .   Reducing primidone to a night time dose only. 50-25 mg, depending on morning drowsiness. I would like to  go to prn,  goal to wean off.   Clonazepam encouraged- he has not used it in months, yet he has REM BD, unabated.    Will follow up alternating with NP q 6 month;  Use a MOCA if MMSE over 26 points, please.   Asencion Partridge Kirbi Farrugia MD  02/28/2021   CC: Deland Pretty, [address] [place],  [place] [id number]

## 2021-03-11 ENCOUNTER — Other Ambulatory Visit: Payer: Self-pay | Admitting: Neurology

## 2021-03-14 DIAGNOSIS — Z79899 Other long term (current) drug therapy: Secondary | ICD-10-CM | POA: Diagnosis not present

## 2021-03-14 DIAGNOSIS — M8580 Other specified disorders of bone density and structure, unspecified site: Secondary | ICD-10-CM | POA: Diagnosis not present

## 2021-03-14 DIAGNOSIS — M79646 Pain in unspecified finger(s): Secondary | ICD-10-CM | POA: Diagnosis not present

## 2021-03-14 DIAGNOSIS — M0609 Rheumatoid arthritis without rheumatoid factor, multiple sites: Secondary | ICD-10-CM | POA: Diagnosis not present

## 2021-03-14 DIAGNOSIS — M1990 Unspecified osteoarthritis, unspecified site: Secondary | ICD-10-CM | POA: Diagnosis not present

## 2021-03-14 DIAGNOSIS — R768 Other specified abnormal immunological findings in serum: Secondary | ICD-10-CM | POA: Diagnosis not present

## 2021-04-23 ENCOUNTER — Ambulatory Visit: Payer: Medicare Other | Admitting: Cardiology

## 2021-04-25 ENCOUNTER — Ambulatory Visit: Payer: Medicare Other | Admitting: Cardiology

## 2021-05-10 DIAGNOSIS — M79605 Pain in left leg: Secondary | ICD-10-CM | POA: Diagnosis not present

## 2021-05-12 IMAGING — US US RENAL
1 series · 14 of 25 positions shown · non-contrast
Comparison: None.

CLINICAL DATA: Chronic renal insufficiency stage III

EXAM:
RENAL / URINARY TRACT ULTRASOUND COMPLETE

[Series 1: us renal · 0.23mm/px · 14 of 30 slices shown]
[im 1/30]
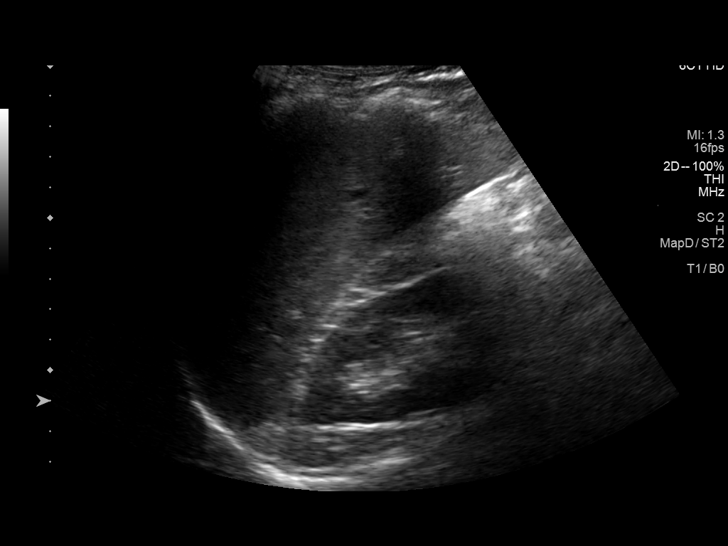
[im 3/30]
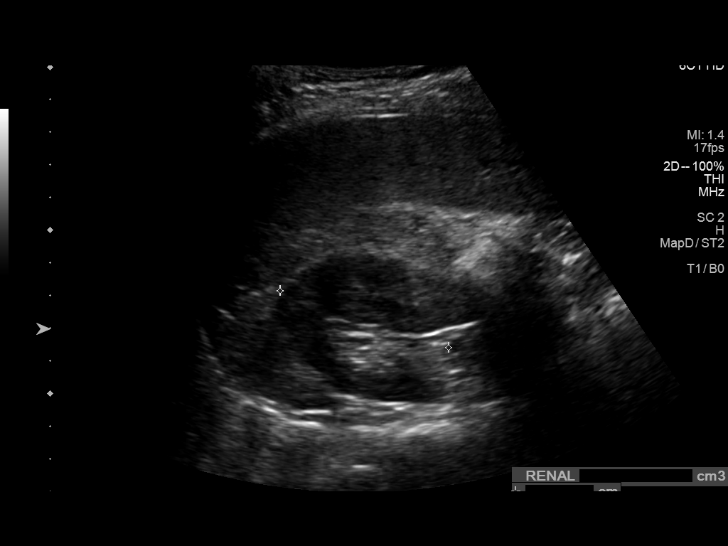
[im 5/30]
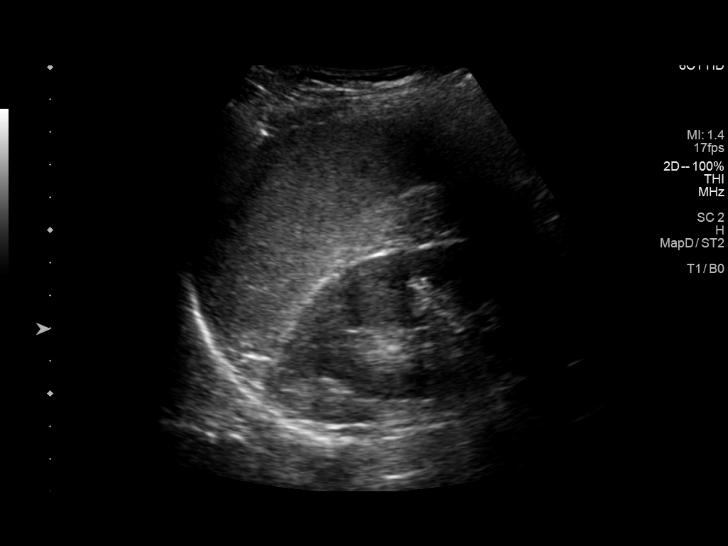
[im 8/30]
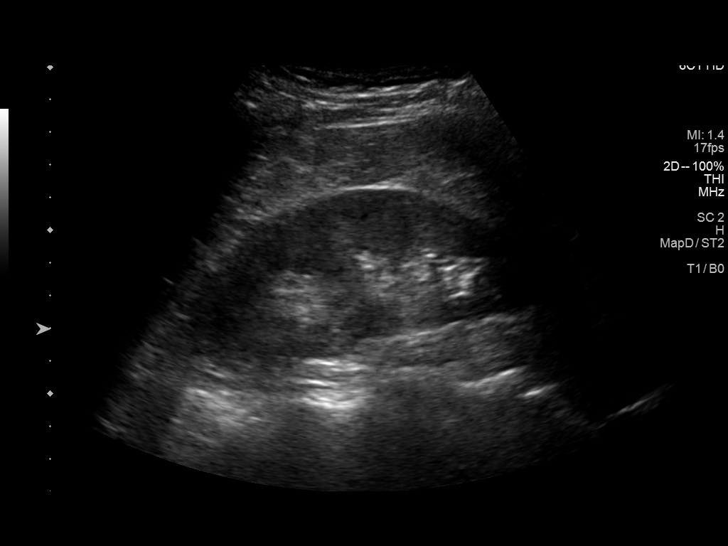
[im 10/30]
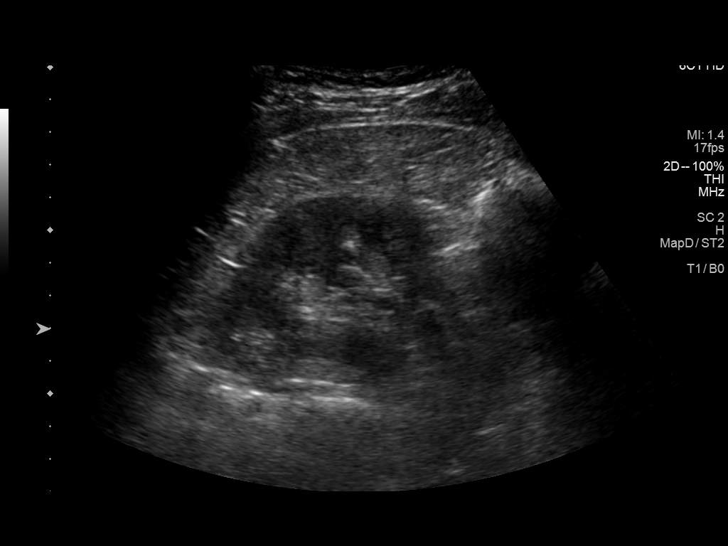
[im 11/30]
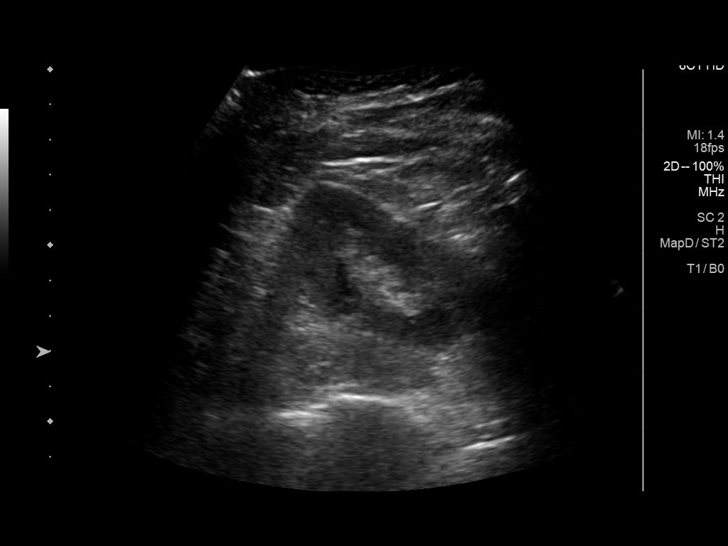
[im 14/30]
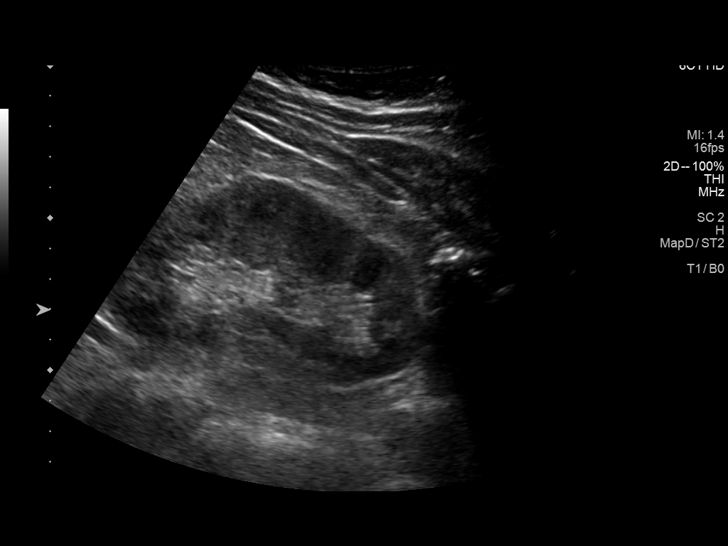
[im 16/30]
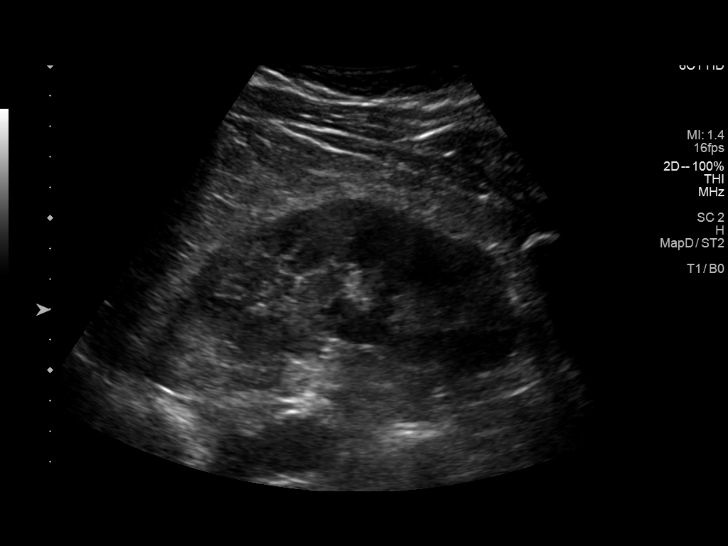
[im 19/30]
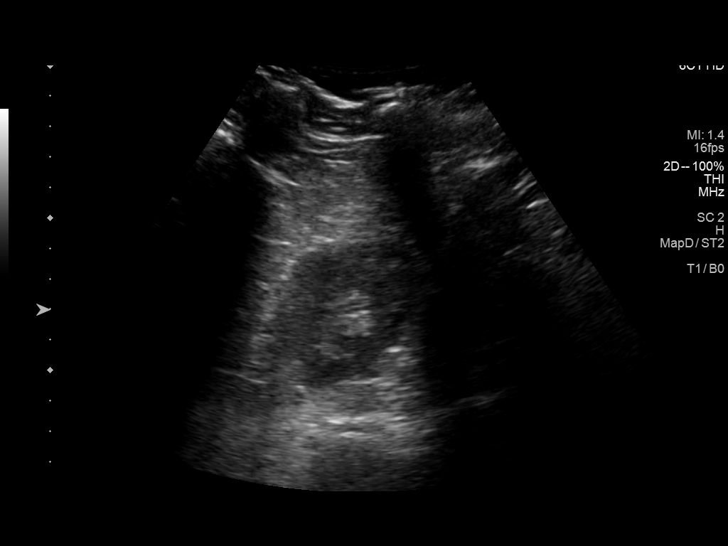
[im 20/30]
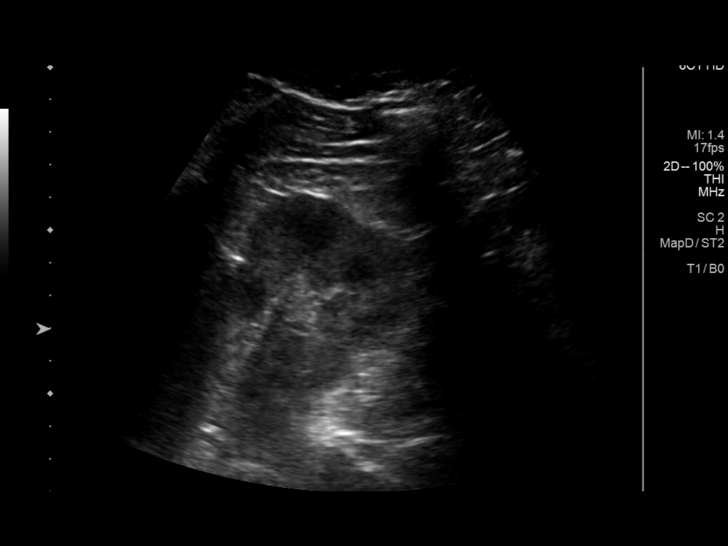
[im 22/30]
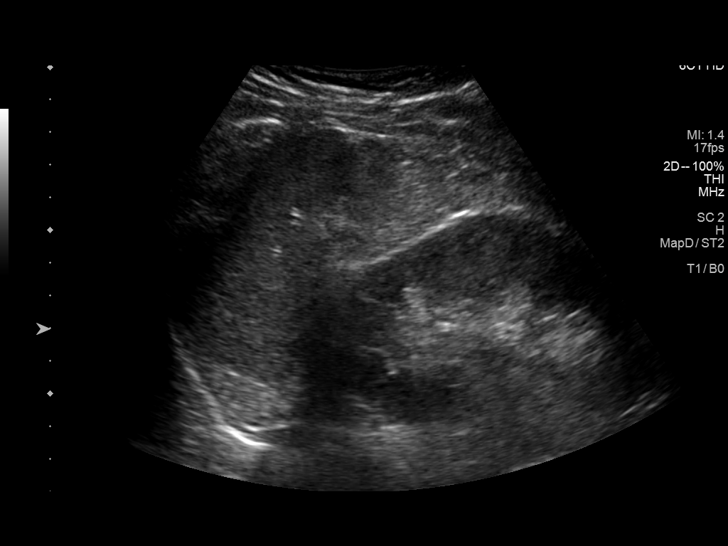
[im 25/30]
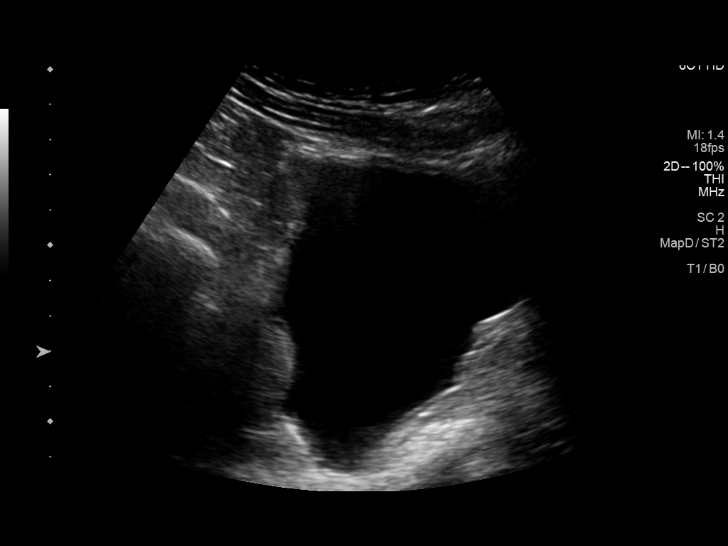
[im 27/30]
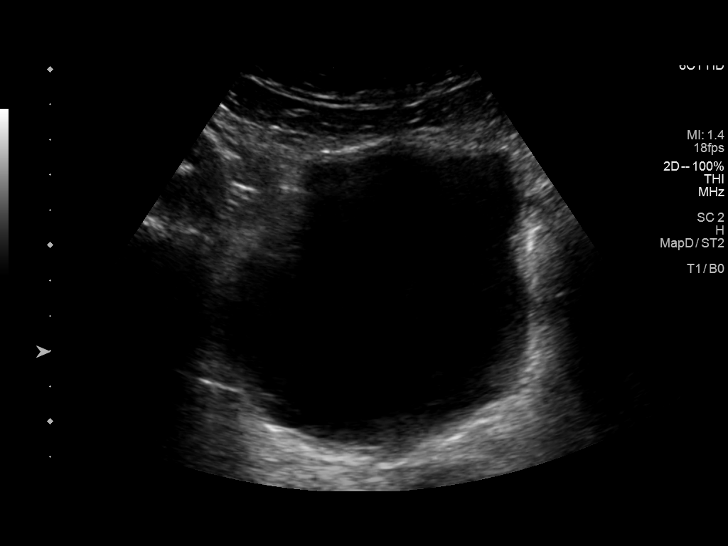
[im 30/30]
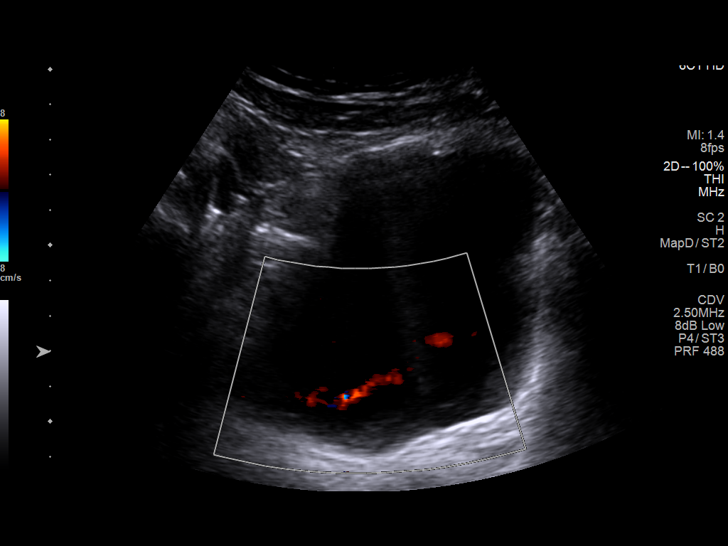

[14 of 25 positions shown; findings below may reference images not displayed]

FINDINGS: Right Kidney:

Renal measurements: 10.7 x 4.5 x 5.4 cm = volume: 136 mL.
Echogenicity within normal limits. No mass or hydronephrosis
visualized.

Left Kidney:

Renal measurements: 11.1 x 6.5 x 4.6 cm = volume: 174 mL.
Echogenicity within normal limits. No mass or hydronephrosis
visualized.

Bladder:

Appears normal for degree of bladder distention.

Other:

None.
IMPRESSION: 1. Unremarkable renal ultrasound.

## 2021-05-16 ENCOUNTER — Other Ambulatory Visit: Payer: Self-pay

## 2021-05-16 ENCOUNTER — Ambulatory Visit (INDEPENDENT_AMBULATORY_CARE_PROVIDER_SITE_OTHER): Payer: Medicare Other

## 2021-05-16 ENCOUNTER — Ambulatory Visit (INDEPENDENT_AMBULATORY_CARE_PROVIDER_SITE_OTHER): Payer: Medicare Other | Admitting: Family Medicine

## 2021-05-16 VITALS — BP 108/72 | HR 105 | Ht 70.0 in | Wt 177.6 lb

## 2021-05-16 DIAGNOSIS — M5136 Other intervertebral disc degeneration, lumbar region: Secondary | ICD-10-CM

## 2021-05-16 DIAGNOSIS — R931 Abnormal findings on diagnostic imaging of heart and coronary circulation: Secondary | ICD-10-CM | POA: Diagnosis not present

## 2021-05-16 DIAGNOSIS — M51369 Other intervertebral disc degeneration, lumbar region without mention of lumbar back pain or lower extremity pain: Secondary | ICD-10-CM

## 2021-05-16 DIAGNOSIS — M4186 Other forms of scoliosis, lumbar region: Secondary | ICD-10-CM | POA: Diagnosis not present

## 2021-05-16 DIAGNOSIS — M25552 Pain in left hip: Secondary | ICD-10-CM

## 2021-05-16 DIAGNOSIS — M1612 Unilateral primary osteoarthritis, left hip: Secondary | ICD-10-CM | POA: Diagnosis not present

## 2021-05-16 DIAGNOSIS — M79605 Pain in left leg: Secondary | ICD-10-CM

## 2021-05-16 DIAGNOSIS — M47816 Spondylosis without myelopathy or radiculopathy, lumbar region: Secondary | ICD-10-CM | POA: Diagnosis not present

## 2021-05-16 NOTE — Patient Instructions (Addendum)
Thank you for coming in today.   Please get an Xray today before you leave   I've referred you to Physical Therapy.  Let us know if you don't hear from them in one week.   Recheck back in 8 weeks.

## 2021-05-16 NOTE — Progress Notes (Signed)
Subjective:    CC: L thigh pain  I, Molly Weber, LAT, ATC, am serving as scribe for Dr. Lynne Leader.  HPI: Pt is a 75 y/o male presenting w/ L ant thigh pain x approximately one month w/ no MOI. Pt notes that he's been working out to try to help w/ his Parkinson's disease, but doesn't recall a specific mechanism. He locates his pain specifically to anterior mid-thigh, anterior hip, and sometimes into L knee and buttocks.  He has a hx of a prior lumbar fusion in 2017 and has Parkinson's disease.  The location of pain will vary based on how he's positioned.  Low back pain: yes- just discomfort Radiating pain: yes LE numbness/tingling: no Aggravating factors: laying supine Treatments tried: Tramadol; Tylenol  Pertinent review of Systems: No fevers or chills  Relevant historical information: Parkinson's disease and lumbar fusion history.   Objective:    Vitals:   05/16/21 1113  BP: 108/72  Pulse: (!) 105  SpO2: 96%   General: Well Developed, well nourished, and in no acute distress.   MSK: L-spine: Nontender midline.  Decreased lumbar motion. Lower extremity strength is intact except noted below. Reflexes are diminished bilaterally. Left hip normal-appearing Nontender. Hip range of motion limited to flexion and internal rotation with mild pain. Hip abduction and external rotation strength is diminished.  Lab and Radiology Results  X-ray images L-spine and left hip obtained today personally and independently interpreted  L-spine: Posterior surgical hardware at left side at L4-L5 in good position.  Mild scoliosis present.  Diffuse DDD and facet DJD present. Multiple surgical staples in the pelvis.  Left hip: Left hip moderate DJD without acute fracture.  Await formal radiology review  CLINICAL DATA:  Bilateral lower extremity pain, including  right-sided pain extending to the knee and more diffuse left sided  pain including groin/medial thigh pain.   EXAM:   LUMBAR MYELOGRAM   FLUOROSCOPY TIME:  0 min 48 seconds   PROCEDURE:  After thorough discussion of risks and benefits of the procedure  including bleeding, infection, injury to nerves, blood vessels,  adjacent structures as well as headache and CSF leak, written and  oral informed consent was obtained. Consent was obtained by Dr.  Logan Bores. Time out form was completed.   Patient was positioned prone on the fluoroscopy table. Local  anesthesia was provided with 1% lidocaine without epinephrine after  prepped and draped in the usual sterile fashion. Puncture was  performed at L2-3 using a 3 1/2 inch 22-gauge spinal needle via a  left paramedian/nearly midline approach. Using a single pass through  the dura, the needle was placed within the thecal sac, with return  of clear CSF. 15 mL of Omnipaque-180 was injected into the thecal  sac, with normal opacification of the nerve roots and cauda equina  consistent with free flow within the subarachnoid space.   I personally performed the lumbar puncture and administered the  intrathecal contrast. I also personally supervised acquisition of  the myelogram images.   TECHNIQUE:  Contiguous axial images were obtained through the Lumbar spine after  the intrathecal infusion of contrast. Coronal and sagittal  reconstructions were obtained of the axial image sets.   COMPARISON:  12/17/2011   FINDINGS:  LUMBAR MYELOGRAM FINDINGS:   There is mild lumbar dextroscoliosis. No listhesis is identified  with prone or upright neutral, flexion, or extension imaging.  Vertebral body heights are preserved without evidence of compression  fracture. Severe disc space narrowing is  again seen at L5-S1. Small  ventral extradural defects are present at L3-4 and L4-5 without  evidence of significant spinal stenosis.   CT LUMBAR MYELOGRAM FINDINGS:   Again seen is mild lumbar dextroscoliosis with apex at L1-2. There  is no listhesis. Vertebral body  heights are preserved without  compression fracture. Severe disc space narrowing is again seen at  L5-S1. Mild disc space narrowing with vacuum disc phenomenon is  again noted at L4-5, asymmetric on the right. Bridging anterior  osteophytosis is present at L4-5 and L5-S1. Conus medullaris  terminates at L1-2. Moderate aortoiliac atherosclerotic  calcification is present.   L1-2:  Negative.   L2-3: Shallow left foraminal disc osteophyte complex results in mild  left lateral recess and left neural foraminal narrowing, unchanged.  Disc material may contact the left L2 nerve lateral to the foramen.  No spinal canal stenosis.   L3-4: Mild disc bulge and mild facet hypertrophy, left greater than  right, result in mild left greater than right neural foraminal  narrowing without spinal canal stenosis, not significantly changed.   L4-5: Interval partial facetectomy and laminectomy on the right.  Disc bulge asymmetric to the right with endplate osteophyte  formation results in moderate right neural foraminal stenosis at the  lateral aspect of the foramen and unchanged, mild left neural  foraminal stenosis. No spinal canal stenosis. Right-sided neural  foraminal narrowing is improved from the prior CT, particularly in  the medial aspect of the foramen.   L5-S1: Disc space height loss and endplate spurring result in  mild-to-moderate right neural foraminal stenosis, similar to prior.  No significant left neural foraminal stenosis. No spinal canal  stenosis.   IMPRESSION:  1. Interval postoperative changes on the right at L4-5 with moderate  residual narrowing of the right neural foramen laterally, improved  from prior.  2. Unchanged appearance of lumbar spondylosis elsewhere with mild  left neural foraminal stenosis at L2-3 and L3-4 and mild-to-moderate  right neural foraminal stenosis at L5-S1. No spinal canal stenosis.    Electronically Signed    By: Logan Bores    On: 01/31/2014  16:31  I, Lynne Leader, personally (independently) visualized and performed the interpretation of the images attached in this note.   Impression and Recommendations:    Assessment and Plan: 75 y.o. male with left thigh pain and hip pain multifactorial. Differential includes L3 or L2 lumbar radiculopathy, anterior hip pain from DJD or greater trochanter/hip abductor related pain.  It is possible that he has a component of L3.  We will start with physical therapy.  I think there is a great chance that a lot of his pain will resolve at this.  Check back in 8 weeks.  Return sooner if needed.  Consider next step to be diagnostic and therapeutic femoral acetabular injection.  May also consider an epidural steroid injection or repeat lumbar imaging.  Careful discussion with family and patient.Marland Kitchen  PDMP not reviewed this encounter. Orders Placed This Encounter  Procedures   DG HIP UNILAT W OR W/O PELVIS 2-3 VIEWS LEFT    Standing Status:   Future    Number of Occurrences:   1    Standing Expiration Date:   05/16/2022    Order Specific Question:   Reason for Exam (SYMPTOM  OR DIAGNOSIS REQUIRED)    Answer:   low back pain    Order Specific Question:   Preferred imaging location?    Answer:   [facility]   DG  Lumbar Spine 2-3 Views    Standing Status:   Future    Number of Occurrences:   1    Standing Expiration Date:   05/16/2022    Order Specific Question:   Reason for Exam (SYMPTOM  OR DIAGNOSIS REQUIRED)    Answer:   low back pain    Order Specific Question:   Preferred imaging location?    Answer:   [facility]   Ambulatory referral to Physical Therapy    Referral Priority:   Routine    Referral Type:   Physical Medicine    Referral Reason:   Specialty Services Required    Requested Specialty:   Physical Therapy    Number of Visits Requested:   1   No orders of the defined types were placed in this encounter.   Discussed warning signs or symptoms. Please see  discharge instructions. Patient expresses understanding.   The above documentation has been reviewed and is accurate and complete Lynne Leader, M.D.

## 2021-05-17 NOTE — Progress Notes (Signed)
Left hip x-ray shows medium left hip arthritis and some arthritis at the SI joint (the junction between the pelvis and the base of the spine)

## 2021-05-17 NOTE — Progress Notes (Signed)
Lumbar spine x-ray shows diffuse arthritis changes and some scoliosis.  The fusion hardware is intact from the spinal fusion.

## 2021-05-19 ENCOUNTER — Other Ambulatory Visit: Payer: Self-pay | Admitting: Adult Health

## 2021-05-23 ENCOUNTER — Encounter: Payer: Self-pay | Admitting: Neurology

## 2021-05-23 ENCOUNTER — Other Ambulatory Visit: Payer: Self-pay | Admitting: Neurology

## 2021-05-23 MED ORDER — CLONAZEPAM 0.5 MG PO TABS: ORAL_TABLET | ORAL | 5 refills | Status: DC

## 2021-05-28 ENCOUNTER — Ambulatory Visit: Payer: Medicare Other | Attending: Family Medicine

## 2021-05-28 ENCOUNTER — Other Ambulatory Visit: Payer: Self-pay

## 2021-05-28 DIAGNOSIS — M25652 Stiffness of left hip, not elsewhere classified: Secondary | ICD-10-CM | POA: Diagnosis not present

## 2021-05-28 DIAGNOSIS — G8929 Other chronic pain: Secondary | ICD-10-CM | POA: Insufficient documentation

## 2021-05-28 DIAGNOSIS — R252 Cramp and spasm: Secondary | ICD-10-CM | POA: Diagnosis not present

## 2021-05-28 DIAGNOSIS — M5136 Other intervertebral disc degeneration, lumbar region: Secondary | ICD-10-CM | POA: Insufficient documentation

## 2021-05-28 NOTE — Therapy (Signed)
[facility] @ [place], [place], [address] Phone: [phone]   Fax:  [phone]  Physical Therapy Evaluation  Patient Details  Name: Tyler Hayes MRN: [id number] Date of Birth: July 26, 1945 Referring Provider (PT): Lynne Leader, MD   Encounter Date: 05/28/2021   PT End of Session - 05/28/21 1312     Visit Number 1    Date for PT Re-Evaluation 07/23/21    Authorization Type Medicare    Progress Note Due on Visit 10    PT Start Time 1234    PT Stop Time 1316    PT Time Calculation (min) 42 min    Activity Tolerance Patient tolerated treatment well    Behavior During Therapy [facility] for tasks assessed/performed             Past Medical History:  Diagnosis Date   Arthritis    back   Cancer [facility])    prostate   Diabetes mellitus without complication ([place])    on meds   Diverticulosis    Hypercholesteremia    Hypertension    Meatal stenosis    Osteoarthritis    Sinus drainage    Tinea pedis     Past Surgical History:  Procedure Laterality Date   [place]  1984, 2013   lumbar   COLONOSCOPY  06/10/2019   Dr. Michail Sermon   DG DILATION URETERS  2003   EYE SURGERY Bilateral 1999   Lasik   LEFT HEART CATH AND CORONARY ANGIOGRAPHY N/A 05/26/2018   Procedure: LEFT HEART CATH AND CORONARY ANGIOGRAPHY;  Surgeon: Nigel Mormon, MD;  Location: [facility];  Service: Cardiovascular;  Laterality: N/A;   POSTERIOR LUMBAR FUSION 4 LEVEL N/A 06/01/2014   Procedure: LUMBAR THREE TO FOUR, LUMBAR FOUR TO FIVE LAMINECTOMY,  RIGHT LUMBAR FUSION AT LUMBAR FOUR TO FIVE;  Surgeon: Floyce Stakes, MD;  Location: [facility];  Service: Neurosurgery;  Laterality: N/A;  POSSIBLE L2-3 L3-4 L4-5 L5-S1 POSTERIOR LUMBAR INTERBODY FUSION   PROSTATECTOMY  2002    There were no vitals filed for this visit.    Subjective Assessment - 05/28/21 1240     Subjective Pt present to PT with LBP and Lt LE pain that  began 2 months ago without cause.  Pt has history of L4/5 lumbar fusion (2017) and also has Parkinson's (diganosed in 2018).  Pt exercises regularly at the gym.    Pertinent History Parkinson's disease, lumbar fusion L4/5, prostate cancer, DM, HTN    Limitations Sitting;Standing;Walking    How long can you sit comfortably? knee pain with sitting    How long can you stand comfortably? 10-15 minutes- leg pain    How long can you walk comfortably? 15-20 min- limited by Parkinsons    Diagnostic tests x-ray: stable lumbar fusion, diffuse DDD and DJD, Lt hip mod DJD    Patient Stated Goals reduce Lt LE pain, sit, stand, walk without limitation    Currently in Pain? Yes    Pain Score 4    focused on Lt knee   Pain Location Leg    Pain Orientation Left    Pain Descriptors / Indicators Burning;Grimacing    Pain Type Chronic pain    Pain Radiating Towards Lt side of low back and gluteals    Pain Onset More than a month ago    Aggravating Factors  sitting with knee bent, night with sleep    Pain Relieving Factors changing position, Tylenol  [facility] PT Assessment - 05/28/21 0001       Assessment   Medical Diagnosis Lumbar degenerative disc disease    Referring Provider (PT) Lynne Leader, MD    Onset Date/Surgical Date 03/28/21    Next MD Visit 8 weeks    Prior Therapy none      Precautions   Precautions Other (comment)    Precaution Comments prostate cancer, Parkinsons      Balance Screen   Has the patient fallen in the past 6 months No    Has the patient had a decrease in activity level because of a fear of falling?  No    Is the patient reluctant to leave their home because of a fear of falling?  No      Home Environment   Living Environment Private residence    Living Arrangements Spouse/significant other    Type of [place] to enter    [place] to live on main level with bedroom/bathroom;Two level      Prior Function   Level of  Independence Independent    Vocation Retired    Leisure exercise at [employer]   Overall Cognitive Status Within Functional Limits for tasks assessed      Observation/Other Assessments   Focus on Therapeutic Outcomes (FOTO)  47 (goal is 53)      Posture/Postural Control   Posture/Postural Control Postural limitations    Postural Limitations Flexed trunk;Decreased lumbar lordosis;Forward head;Weight shift right      ROM / Strength   AROM / PROM / Strength AROM;PROM;Strength      AROM   Overall AROM  Deficits;Unable to assess;Due to pain    Overall AROM Comments guarded Lt LE A/ROM.  Lumbar A/ROM limited most into extension and Rt sidebending      PROM   Overall PROM  Deficits;Unable to assess;Due to pain    Overall PROM Comments Lt LE guarding and pain      Strength   Overall Strength Within functional limits for tasks performed      Palpation   Spinal mobility reduced in thoracic and lumbar spine.  Pain L3-5    Palpation comment tension and pain Lt adductors, distal quads and trigger points in Lt hamstrings and lumbar paraspinals      Special Tests    Special Tests Lumbar    Other special tests unable to test Lt due to guarding      Transfers   Transfers Independent with all Transfers      Ambulation/Gait   Ambulation/Gait Yes    Ambulation/Gait Assistance 7: Independent    Gait Pattern Step-through pattern;Decreased stance time - left;Decreased weight shift to left                        Objective measurements completed on examination: See above findings.                PT Education - 05/28/21 1311     Education Details Access Code: [email or web address]    Person(s) Educated Patient    Methods Explanation;Demonstration;Handout    Comprehension Verbalized understanding              PT Short Term Goals - 05/28/21 1330       PT SHORT TERM GOAL #1   Title be independent in initial HEP    Time 4    Period Weeks  Status  New    Target Date 06/25/21      PT SHORT TERM GOAL #2   Title report a 30% reduction in Lt LE pain with sitting    Time 4    Period Weeks    Status New    Target Date 06/25/21      PT SHORT TERM GOAL #3   Title fall asleep with 30% increased ease due to reduced Lt LE pain    Time 4    Period Weeks    Status New    Target Date 06/25/21               PT Long Term Goals - 05/28/21 1332       PT LONG TERM GOAL #1   Title be independent in advanced HEP    Time 8    Period Weeks    Status New    Target Date 07/23/21      PT LONG TERM GOAL #2   Title improve FOTO to > or = to 53 to improve function    Time 8    Period Weeks    Status New    Target Date 07/23/21      PT LONG TERM GOAL #3   Title report > or = to 60% reduction in Lt LE pain with sitting    Time 8    Period Weeks    Status New    Target Date 07/23/21      PT LONG TERM GOAL #4   Title sleep and fall asleep with > 70% fewer interruptions    Time 8    Period Weeks    Status New    Target Date 07/23/21      PT LONG TERM GOAL #5   Title demosntrate full Lt LE A/ROM and P/ROM without pain limiting this to improve dressing and self-care    Time 8    Period Weeks    Status New    Target Date 07/23/21                    Plan - 05/28/21 1323     Clinical Impression Statement Pt is a 76 y/o male presenting with Lt anterior thigh and knee pain x approximately 2 months without incident or injury. He locates his pain specifically to anterior mid-thigh, anterior hip, and into Lt knee and buttocks.  He has a hx of a prior lumbar fusion in 2017 L4/5 and has Parkinson's disease. X-ray showed stable lumbar fusion, diffuse DDD in the spine and Lt hip DJD.  Pt is active and exercises at [employer].  Pt reports 3-4/10 Lt LE pain that he describes as burning and intense at times localized to the Lt knee.  Pain is worse when trying to fall asleep at night, when putting on socks/shoes and with sitting long periods.   Pain is reduced with Tylenol and change of position.  Pt demonstrates limited lumbar A/ROM in all directions with most restriction into extension and Rt sidebending.  No pain reported.  Pt with guarded movement and P/ROM of the Lt LE with report of pain and muscle stiffness with all directions.  Pt with minimal palpable tenderness over Lt adductors and distal quads.  Trigger point in lumbar paraspinals, Lt hamstrings and Lt gluteals.  It is unclear if symptoms are flexion or extension.  Pt will continue to benefit from skilled PT to address Lt LE pain and improve function.    Personal  Factors and Comorbidities Comorbidity 2    Comorbidities Parkinson's, Lumbar fusion    Examination-Activity Limitations Locomotion Level;Sit;Stand    Examination-Participation Restrictions Driving;Community Activity    Stability/Clinical Decision Making Evolving/Moderate complexity    Clinical Decision Making Moderate    Rehab Potential Good    PT Frequency 2x / week    PT Duration 8 weeks    PT Treatment/Interventions ADLs/Self Care Home Management;Cryotherapy;Moist Heat;Electrical Stimulation;Traction;Gait training;Functional mobility training;Therapeutic activities;Therapeutic exercise;Neuromuscular re-education;Patient/family education;Manual techniques;Dry needling;Passive range of motion;Taping;Joint Manipulations;Spinal Manipulations    PT Next Visit Plan gentle Lt hip flexibility, DN to Lt adductors, multifidi and hamstrings    PT Home Exercise Plan Access Code: [email or web address]    Consulted and Agree with Plan of Care Patient             Patient will benefit from skilled therapeutic intervention in order to improve the following deficits and impairments:  Abnormal gait, Decreased activity tolerance, Decreased strength, Pain, Postural dysfunction, Decreased range of motion, Decreased mobility, Decreased endurance, Increased muscle spasms  Visit Diagnosis: Chronic bilateral low back pain with left-sided sciatica  - Plan: PT plan of care cert/re-cert  Cramp and spasm - Plan: PT plan of care cert/re-cert  Stiffness of left hip, not elsewhere classified - Plan: PT plan of care cert/re-cert     Problem List Patient Active Problem List   Diagnosis Date Noted   Dysphonia 02/28/2021   MCI (mild cognitive impairment) 02/28/2021   Coronary artery disease involving native coronary artery of native heart without angina pectoris 10/27/2020   Gait disturbance 08/29/2020   Essential tremor 08/29/2020   Elevated coronary artery calcium score 08/24/2020   Primary parkinsonism ([place]) 05/04/2020   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   Mixed hyperlipidemia 01/25/2020   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[place]) 01/25/2020   Essential hypertension 11/04/2018   Abdominal aortic aneurysm (AAA) without rupture 07/24/2018   Sleep behavior disorder, REM 07/21/2018   Abnormal stress test 05/26/2018   Exertional dyspnea 05/26/2018   Nonischemic cardiomyopathy ([place]) 05/26/2018   Tremor observed on examination 06/25/2017   Uncontrolled REM sleep behavior disorder 03/11/2016   Nightmares REM-sleep type 03/11/2016   Lumbar degenerative disc disease 06/01/2014   Sigurd Sos, PT 05/28/21 1:36 PM  [facility] @ [place], [place], [address] Phone: [phone]   Fax:  [phone]  Name: Tyler Hayes MRN: [id number] Date of Birth: 28-Mar-1946

## 2021-05-28 NOTE — Patient Instructions (Signed)
Access Code: [email or web address] URL: [email or web address] Date: 05/28/2021 Prepared by: Claiborne Billings  Exercises Supine Butterfly Groin Stretch - 2-3 x daily - 7 x weekly - 1 sets - 3 reps - 20-30 hold Seated Hamstring Stretch - 2-3 x daily - 7 x weekly - 1 sets - 3 reps - 20-30 hold Seated Figure 4 Piriformis Stretch - 2 x daily - 7 x weekly - 1 sets - 3 reps - 20-30 hold

## 2021-05-30 ENCOUNTER — Other Ambulatory Visit: Payer: Self-pay

## 2021-05-30 ENCOUNTER — Ambulatory Visit: Payer: Medicare Other

## 2021-05-30 DIAGNOSIS — G8929 Other chronic pain: Secondary | ICD-10-CM | POA: Diagnosis not present

## 2021-05-30 DIAGNOSIS — M5136 Other intervertebral disc degeneration, lumbar region: Secondary | ICD-10-CM | POA: Diagnosis not present

## 2021-05-30 DIAGNOSIS — R252 Cramp and spasm: Secondary | ICD-10-CM

## 2021-05-30 DIAGNOSIS — M25652 Stiffness of left hip, not elsewhere classified: Secondary | ICD-10-CM

## 2021-05-30 NOTE — Patient Instructions (Addendum)
Access Code: [email or web address] URL: [email or web address] Date: 05/30/2021 Prepared by: Claiborne Billings  Exercises Supine Butterfly Groin Stretch - 2-3 x daily - 7 x weekly - 1 sets - 3 reps - 20-30 hold Seated Hamstring Stretch - 2-3 x daily - 7 x weekly - 1 sets - 3 reps - 20-30 hold Seated Figure 4 Piriformis Stretch - 2 x daily - 7 x weekly - 1 sets - 3 reps - 20-30 hold Supine Figure 4 Piriformis Stretch - 2 x daily - 7 x weekly - 1 sets - 2 reps - 20 hold Supine Lower Trunk Rotation - 3 x daily - 7 x weekly - 1 sets - 3 reps - 20 hold   Trigger Point Dry Needling  What is Trigger Point Dry Needling (DN)? DN is a physical therapy technique used to treat muscle pain and dysfunction. Specifically, DN helps deactivate muscle trigger points (muscle knots).  A thin filiform needle is used to penetrate the skin and stimulate the underlying trigger point. The goal is for a local twitch response (LTR) to occur and for the trigger point to relax. No medication of any kind is injected during the procedure.   What Does Trigger Point Dry Needling Feel Like?  The procedure feels different for each individual patient. Some patients report that they do not actually feel the needle enter the skin and overall the process is not painful. Very mild bleeding may occur. However, many patients feel a deep cramping in the muscle in which the needle was inserted. This is the local twitch response.   How Will I feel after the treatment? Soreness is normal, and the onset of soreness may not occur for a few hours. Typically this soreness does not last longer than two days.  Bruising is uncommon, however; ice can be used to decrease any possible bruising.  In rare cases feeling tired or nauseous after the treatment is normal. In addition, your symptoms may get worse before they get better, this period will typically not last longer than 24 hours.   What Can I do After My Treatment? Increase your hydration by  drinking more water for the next 24 hours. You may place ice or heat on the areas treated that have become sore, however, do not use heat on inflamed or bruised areas. Heat often brings more relief post needling. You can continue your regular activities, but vigorous activity is not recommended initially after the treatment for 24 hours. DN is best combined with other physical therapy such as strengthening, stretching, and other therapies.  [place]  [address] [place] [address].  [phone]

## 2021-05-30 NOTE — Therapy (Signed)
[facility] @ [place], [place], [address] Phone: [phone]   Fax:  [phone]  Physical Therapy Treatment  Patient Details  Name: Tyler Hayes MRN: [id number] Date of Birth: [birth year] Referring Provider (PT): Lynne Leader, MD   Encounter Date: 05/30/2021   PT End of Session - 05/30/21 1438     Visit Number 2    Date for PT Re-Evaluation 07/23/21    Authorization Type Medicare    Progress Note Due on Visit 10    PT Start Time 1357    PT Stop Time 1435    PT Time Calculation (min) 38 min    Activity Tolerance Patient tolerated treatment well    Behavior During Therapy [facility] for tasks assessed/performed             Past Medical History:  Diagnosis Date   Arthritis    back   Cancer [facility])    prostate   Diabetes mellitus without complication ([place])    on meds   Diverticulosis    Hypercholesteremia    Hypertension    Meatal stenosis    Osteoarthritis    Sinus drainage    Tinea pedis     Past Surgical History:  Procedure Laterality Date   [place]  1984, 2013   lumbar   COLONOSCOPY  06/10/2019   Dr. Michail Sermon   DG DILATION URETERS  2003   EYE SURGERY Bilateral 1999   Lasik   LEFT HEART CATH AND CORONARY ANGIOGRAPHY N/A 05/26/2018   Procedure: LEFT HEART CATH AND CORONARY ANGIOGRAPHY;  Surgeon: Nigel Mormon, MD;  Location: [facility];  Service: Cardiovascular;  Laterality: N/A;   POSTERIOR LUMBAR FUSION 4 LEVEL N/A 06/01/2014   Procedure: LUMBAR THREE TO FOUR, LUMBAR FOUR TO FIVE LAMINECTOMY,  RIGHT LUMBAR FUSION AT LUMBAR FOUR TO FIVE;  Surgeon: Floyce Stakes, MD;  [facility];  Service: Neurosurgery;  Laterality: N/A;  POSSIBLE L2-3 L3-4 L4-5 L5-S1 POSTERIOR LUMBAR INTERBODY FUSION   PROSTATECTOMY  2002    There were no vitals filed for this visit.   Subjective Assessment - 05/30/21 1358     Subjective I am doing OK.  I am stiff.  My pain is not as  sensitive or frequent.  I am not sure that I am doing my exercises correctly.    Currently in Pain? Yes    Pain Score 2     Pain Location Leg    Pain Orientation Left    Pain Descriptors / Indicators Burning                               [facility] Adult PT Treatment/Exercise - 05/30/21 0001       Exercises   Exercises Knee/Hip;Lumbar      Lumbar Exercises: Stretches   Active Hamstring Stretch 3 reps;Left;Right;20 seconds    Lower Trunk Rotation 3 reps;20 seconds    Piriformis Stretch 3 reps;Left;Right;20 seconds    Piriformis Stretch Limitations supine and seated    Other Lumbar Stretch Exercise supine butterfly 3x20 seconds      Manual Therapy   Manual Therapy Soft tissue mobilization;Myofascial release    Manual therapy comments skilled palpation and monitoring during DN    Soft tissue mobilization elongation to bil lumbar paraspinals and Lt hamstrings              Trigger Point Dry Needling -  05/30/21 0001     Consent Given? Yes    Education Handout Provided Yes    Muscles Treated Lower Quadrant Quadriceps;Hamstring    Muscles Treated Back/Hip Lumbar multifidi;Erector spinae    Dry Needling Comments lumbar multifidi above old surgical incision, erector spinae at level of incision.  Lt medial hamstring, Lt quads    Quadriceps Response Twitch response elicited;Palpable increased muscle length    Hamstring Response Twitch response elicited;Palpable increased muscle length    Erector spinae Response Twitch response elicited;Palpable increased muscle length    Lumbar multifidi Response Twitch response elicited;Palpable increased muscle length                   PT Education - 05/30/21 1355     Education Details DN info, Access Code: [email or web address]    Person(s) Educated Patient    Methods Explanation;Handout;Demonstration    Comprehension Verbalized understanding;Returned demonstration              PT Short Term Goals - 05/28/21 1330        PT SHORT TERM GOAL #1   Title be independent in initial HEP    Time 4    Period Weeks    Status New    Target Date 06/25/21      PT SHORT TERM GOAL #2   Title report a 30% reduction in Lt LE pain with sitting    Time 4    Period Weeks    Status New    Target Date 06/25/21      PT SHORT TERM GOAL #3   Title fall asleep with 30% increased ease due to reduced Lt LE pain    Time 4    Period Weeks    Status New    Target Date 06/25/21               PT Long Term Goals - 05/28/21 1332       PT LONG TERM GOAL #1   Title be independent in advanced HEP    Time 8    Period Weeks    Status New    Target Date 07/23/21      PT LONG TERM GOAL #2   Title improve FOTO to > or = to 53 to improve function    Time 8    Period Weeks    Status New    Target Date 07/23/21      PT LONG TERM GOAL #3   Title report > or = to 60% reduction in Lt LE pain with sitting    Time 8    Period Weeks    Status New    Target Date 07/23/21      PT LONG TERM GOAL #4   Title sleep and fall asleep with > 70% fewer interruptions    Time 8    Period Weeks    Status New    Target Date 07/23/21      PT LONG TERM GOAL #5   Title demosntrate full Lt LE A/ROM and P/ROM without pain limiting this to improve dressing and self-care    Time 8    Period Weeks    Status New    Target Date 07/23/21                   Plan - 05/30/21 1410     Clinical Impression Statement First time follow-up after evaluation.  Pt reports reduced frequency in Lt LE and lumbar pain even since last  session. Session spent reviewing HEP and dry needling and manual therapy to address tension and trigger points in low back and Lt LE.  Pt was able to demonstrate all aspects of HEP well today and verbal cues were provided by PT to improve alignment and provide less stress on the lumbar spine. Pt demonstrated improved overall movement with less pain today during session and was able to tolerate Lt hip flexion when in  supine.  Pt with tension in the lumbar spine and Lt hamstrings and had good twitch response with DN and improved tissue mobility after manual therapy.  Pt will continue to benefit from skilled PT to address pain and limited functional mobility.    PT Frequency 2x / week    PT Duration 8 weeks    PT Treatment/Interventions ADLs/Self Care Home Management;Cryotherapy;Moist Heat;Electrical Stimulation;Traction;Gait training;Functional mobility training;Therapeutic activities;Therapeutic exercise;Neuromuscular re-education;Patient/family education;Manual techniques;Dry needling;Passive range of motion;Taping;Joint Manipulations;Spinal Manipulations    PT Next Visit Plan gentle Lt hip flexibility, DN to Lt adductors, multifidi and hamstrings    PT Home Exercise Plan Access Code: [email or web address]    Recommended Other Services initial cert is signed    Consulted and Agree with Plan of Care Patient             Patient will benefit from skilled therapeutic intervention in order to improve the following deficits and impairments:  Abnormal gait, Decreased activity tolerance, Decreased strength, Pain, Postural dysfunction, Decreased range of motion, Decreased mobility, Decreased endurance, Increased muscle spasms  Visit Diagnosis: Chronic bilateral low back pain with left-sided sciatica  Cramp and spasm  Stiffness of left hip, not elsewhere classified     Problem List Patient Active Problem List   Diagnosis Date Noted   Dysphonia 02/28/2021   MCI (mild cognitive impairment) 02/28/2021   Coronary artery disease involving native coronary artery of native heart without angina pectoris 10/27/2020   Gait disturbance 08/29/2020   Essential tremor 08/29/2020   Elevated coronary artery calcium score 08/24/2020   Primary parkinsonism ([place]) 05/04/2020   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   Mixed hyperlipidemia 01/25/2020   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[place])  01/25/2020   Essential hypertension 11/04/2018   Abdominal aortic aneurysm (AAA) without rupture 07/24/2018   Sleep behavior disorder, REM 07/21/2018   Abnormal stress test 05/26/2018   Exertional dyspnea 05/26/2018   Nonischemic cardiomyopathy ([place]) 05/26/2018   Tremor observed on examination 06/25/2017   Uncontrolled REM sleep behavior disorder 03/11/2016   Nightmares REM-sleep type 03/11/2016   Lumbar degenerative disc disease 06/01/2014    Sigurd Sos, PT 05/30/21 2:42 PM  [facility] @ [place], [place], [address] Phone: [phone]   Fax:  [phone]  Name: Bryndon Cumbie MRN: [id number] Date of Birth: 1945/07/08

## 2021-06-06 DIAGNOSIS — R768 Other specified abnormal immunological findings in serum: Secondary | ICD-10-CM | POA: Diagnosis not present

## 2021-06-06 DIAGNOSIS — Z79899 Other long term (current) drug therapy: Secondary | ICD-10-CM | POA: Diagnosis not present

## 2021-06-06 DIAGNOSIS — M0609 Rheumatoid arthritis without rheumatoid factor, multiple sites: Secondary | ICD-10-CM | POA: Diagnosis not present

## 2021-06-06 DIAGNOSIS — M25569 Pain in unspecified knee: Secondary | ICD-10-CM | POA: Diagnosis not present

## 2021-06-06 DIAGNOSIS — M8580 Other specified disorders of bone density and structure, unspecified site: Secondary | ICD-10-CM | POA: Diagnosis not present

## 2021-06-06 DIAGNOSIS — M1990 Unspecified osteoarthritis, unspecified site: Secondary | ICD-10-CM | POA: Diagnosis not present

## 2021-06-06 DIAGNOSIS — M549 Dorsalgia, unspecified: Secondary | ICD-10-CM | POA: Diagnosis not present

## 2021-06-06 DIAGNOSIS — M779 Enthesopathy, unspecified: Secondary | ICD-10-CM | POA: Diagnosis not present

## 2021-06-08 ENCOUNTER — Other Ambulatory Visit: Payer: Self-pay

## 2021-06-08 ENCOUNTER — Ambulatory Visit: Payer: Medicare Other

## 2021-06-08 DIAGNOSIS — G8929 Other chronic pain: Secondary | ICD-10-CM | POA: Diagnosis not present

## 2021-06-08 DIAGNOSIS — M25652 Stiffness of left hip, not elsewhere classified: Secondary | ICD-10-CM

## 2021-06-08 DIAGNOSIS — R252 Cramp and spasm: Secondary | ICD-10-CM

## 2021-06-08 DIAGNOSIS — M5136 Other intervertebral disc degeneration, lumbar region: Secondary | ICD-10-CM | POA: Diagnosis not present

## 2021-06-09 NOTE — Therapy (Addendum)
[facility] @ [place], [place], [address] Phone: [phone]   Fax:  [phone]  Physical Therapy Treatment  Patient Details  Name: Tyler Hayes MRN: [id number] Date of Birth: Nov 22, 1945 Referring Provider (PT): Lynne Leader, MD   Encounter Date: 06/08/2021   PT End of Session - 06/08/21 1756     Visit Number 3    Date for PT Re-Evaluation 07/23/21    Authorization Type Medicare    Progress Note Due on Visit 10    PT Start Time 0930    PT Stop Time 1015    PT Time Calculation (min) 45 min    Activity Tolerance Patient tolerated treatment well    Behavior During Therapy [facility] for tasks assessed/performed             Past Medical History:  Diagnosis Date   Arthritis    back   Cancer [facility])    prostate   Diabetes mellitus without complication ([place])    on meds   Diverticulosis    Hypercholesteremia    Hypertension    Meatal stenosis    Osteoarthritis    Sinus drainage    Tinea pedis     Past Surgical History:  Procedure Laterality Date   [place]  1984, 2013   lumbar   COLONOSCOPY  06/10/2019   Dr. Michail Sermon   DG DILATION URETERS  2003   EYE SURGERY Bilateral 1999   Lasik   LEFT HEART CATH AND CORONARY ANGIOGRAPHY N/A 05/26/2018   Procedure: LEFT HEART CATH AND CORONARY ANGIOGRAPHY;  Surgeon: Nigel Mormon, MD;  Location: [facility];  Service: Cardiovascular;  Laterality: N/A;   POSTERIOR LUMBAR FUSION 4 LEVEL N/A 06/01/2014   Procedure: LUMBAR THREE TO FOUR, LUMBAR FOUR TO FIVE LAMINECTOMY,  RIGHT LUMBAR FUSION AT LUMBAR FOUR TO FIVE;  Surgeon: Floyce Stakes, MD;  [facility];  Service: Neurosurgery;  Laterality: N/A;  POSSIBLE L2-3 L3-4 L4-5 L5-S1 POSTERIOR LUMBAR INTERBODY FUSION   PROSTATECTOMY  2002    There were no vitals filed for this visit.   Patient states his back pain is very minimal.  His primary complaint is his legs.  He continues to have  some burning in the left thigh and some new pain in the right lateral thigh with some pinpoint tenderness at the attachment of the ITB.  He saw rheumatologist yesterday and several tests were completed.  He will have follow up on 06-13-21.   Treatment:  NuStep Level 4 x 5 min  Supine: Clam with green loop x 20  Stretches: Supine hamstring with stretch strap x 5 each LE holding 10 sec each Supine ITB stretch with stretch strap x 5 each LE holding 10 sec each  Knee/Hip: Short arc quad 2 sets of 10 each LE Straight leg raise 2 sets of 10 each LE                              PT Short Term Goals - 05/28/21 1330       PT SHORT TERM GOAL #1   Title be independent in initial HEP    Time 4    Period Weeks    Status New    Target Date 06/25/21      PT SHORT TERM GOAL #2   Title report a 30% reduction in Lt LE pain with sitting    Time  4    Period Weeks    Status New    Target Date 06/25/21      PT SHORT TERM GOAL #3   Title fall asleep with 30% increased ease due to reduced Lt LE pain    Time 4    Period Weeks    Status New    Target Date 06/25/21               PT Long Term Goals - 05/28/21 1332       PT LONG TERM GOAL #1   Title be independent in advanced HEP    Time 8    Period Weeks    Status New    Target Date 07/23/21      PT LONG TERM GOAL #2   Title improve FOTO to > or = to 53 to improve function    Time 8    Period Weeks    Status New    Target Date 07/23/21      PT LONG TERM GOAL #3   Title report > or = to 60% reduction in Lt LE pain with sitting    Time 8    Period Weeks    Status New    Target Date 07/23/21      PT LONG TERM GOAL #4   Title sleep and fall asleep with > 70% fewer interruptions    Time 8    Period Weeks    Status New    Target Date 07/23/21      PT LONG TERM GOAL #5   Title demosntrate full Lt LE A/ROM and P/ROM without pain limiting this to improve dressing and self-care    Time 8    Period  Weeks    Status New    Target Date 07/23/21                    Patient will benefit from skilled therapeutic intervention in order to improve the following deficits and impairments:     Visit Diagnosis: Chronic bilateral low back pain with left-sided sciatica  Cramp and spasm  Stiffness of left hip, not elsewhere classified     Problem List Patient Active Problem List   Diagnosis Date Noted   Dysphonia 02/28/2021   MCI (mild cognitive impairment) 02/28/2021   Coronary artery disease involving native coronary artery of native heart without angina pectoris 10/27/2020   Gait disturbance 08/29/2020   Essential tremor 08/29/2020   Elevated coronary artery calcium score 08/24/2020   Primary parkinsonism ([place]) 05/04/2020   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   Mixed hyperlipidemia 01/25/2020   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[place]) 01/25/2020   Essential hypertension 11/04/2018   Abdominal aortic aneurysm (AAA) without rupture 07/24/2018   Sleep behavior disorder, REM 07/21/2018   Abnormal stress test 05/26/2018   Exertional dyspnea 05/26/2018   Nonischemic cardiomyopathy ([place]) 05/26/2018   Tremor observed on examination 06/25/2017   Uncontrolled REM sleep behavior disorder 03/11/2016   Nightmares REM-sleep type 03/11/2016   Lumbar degenerative disc disease 06/01/2014    Anderson Malta B. Anureet Bruington, PT 01/21/235:59 PM   [facility] @ [place], [place], [address] Phone: [phone]   Fax:  [phone]  Name: Tyler Hayes MRN: [id number] Date of Birth: 1946-04-04

## 2021-06-11 ENCOUNTER — Ambulatory Visit: Payer: Medicare Other

## 2021-06-11 ENCOUNTER — Other Ambulatory Visit: Payer: Self-pay

## 2021-06-11 DIAGNOSIS — R252 Cramp and spasm: Secondary | ICD-10-CM | POA: Diagnosis not present

## 2021-06-11 DIAGNOSIS — G8929 Other chronic pain: Secondary | ICD-10-CM

## 2021-06-11 DIAGNOSIS — M5136 Other intervertebral disc degeneration, lumbar region: Secondary | ICD-10-CM | POA: Diagnosis not present

## 2021-06-11 DIAGNOSIS — M5442 Lumbago with sciatica, left side: Secondary | ICD-10-CM

## 2021-06-11 DIAGNOSIS — M25652 Stiffness of left hip, not elsewhere classified: Secondary | ICD-10-CM

## 2021-06-11 NOTE — Therapy (Signed)
[facility] @ [place], [place], [address] Phone: [phone]   Fax:  [phone]  Physical Therapy Treatment  Patient Details  Name: Tyler Hayes MRN: [id number] Date of Birth: [birth year] Referring Provider (PT): Lynne Leader, MD   Encounter Date: 06/11/2021   PT End of Session - 06/11/21 1010     Visit Number 4    Date for PT Re-Evaluation 07/23/21    Authorization Type Medicare    Progress Note Due on Visit 10    PT Start Time 0930    PT Stop Time 1010    PT Time Calculation (min) 40 min    Activity Tolerance Patient tolerated treatment well    Behavior During Therapy [facility] for tasks assessed/performed             Past Medical History:  Diagnosis Date   Arthritis    back   Cancer [facility])    prostate   Diabetes mellitus without complication ([place])    on meds   Diverticulosis    Hypercholesteremia    Hypertension    Meatal stenosis    Osteoarthritis    Sinus drainage    Tinea pedis     Past Surgical History:  Procedure Laterality Date   [place]  1984, 2013   lumbar   COLONOSCOPY  06/10/2019   Dr. Michail Sermon   DG DILATION URETERS  2003   EYE SURGERY Bilateral 1999   Lasik   LEFT HEART CATH AND CORONARY ANGIOGRAPHY N/A 05/26/2018   Procedure: LEFT HEART CATH AND CORONARY ANGIOGRAPHY;  Surgeon: Nigel Mormon, MD;  Location: [facility];  Service: Cardiovascular;  Laterality: N/A;   POSTERIOR LUMBAR FUSION 4 LEVEL N/A 06/01/2014   Procedure: LUMBAR THREE TO FOUR, LUMBAR FOUR TO FIVE LAMINECTOMY,  RIGHT LUMBAR FUSION AT LUMBAR FOUR TO FIVE;  Surgeon: Floyce Stakes, MD;  [facility];  Service: Neurosurgery;  Laterality: N/A;  POSSIBLE L2-3 L3-4 L4-5 L5-S1 POSTERIOR LUMBAR INTERBODY FUSION   PROSTATECTOMY  2002    There were no vitals filed for this visit.   Subjective Assessment - 06/11/21 0927     Subjective I feel 90-95% better overall.  I think the  Prednisone and the exercises have helped.  I just have periodic pain.    Currently in Pain? No/denies                               [facility] Adult PT Treatment/Exercise - 06/11/21 0001       Lumbar Exercises: Stretches   Active Hamstring Stretch 3 reps;Left;Right;20 seconds    Active Hamstring Stretch Limitations using step    Lower Trunk Rotation 3 reps;20 seconds    Piriformis Stretch 3 reps;Left;Right;20 seconds    Piriformis Stretch Limitations seated      Lumbar Exercises: Supine   Ab Set 20 reps    AB Set Limitations with ball squeeze    Clam 20 reps    Clam Limitations red band    Straight Leg Raise 10 reps      Knee/Hip Exercises: Stretches   ITB Stretch Both;2 reps;20 seconds      Knee/Hip Exercises: Seated   Long Arc Quad Strengthening;Both;2 sets;10 reps    Long Arc Quad Limitations verbal and tactile cues to reduce trunk extension due to tight hamstrings  PT Education - 06/11/21 0957     Education Details Access Code: [email or web address]    Person(s) Educated Patient    Methods Explanation;Demonstration;Handout    Comprehension Verbalized understanding;Returned demonstration              PT Short Term Goals - 05/28/21 1330       PT SHORT TERM GOAL #1   Title be independent in initial HEP    Time 4    Period Weeks    Status New    Target Date 06/25/21      PT SHORT TERM GOAL #2   Title report a 30% reduction in Lt LE pain with sitting    Time 4    Period Weeks    Status New    Target Date 06/25/21      PT SHORT TERM GOAL #3   Title fall asleep with 30% increased ease due to reduced Lt LE pain    Time 4    Period Weeks    Status New    Target Date 06/25/21               PT Long Term Goals - 05/28/21 1332       PT LONG TERM GOAL #1   Title be independent in advanced HEP    Time 8    Period Weeks    Status New    Target Date 07/23/21      PT LONG TERM GOAL #2   Title improve FOTO to > or  = to 53 to improve function    Time 8    Period Weeks    Status New    Target Date 07/23/21      PT LONG TERM GOAL #3   Title report > or = to 60% reduction in Lt LE pain with sitting    Time 8    Period Weeks    Status New    Target Date 07/23/21      PT LONG TERM GOAL #4   Title sleep and fall asleep with > 70% fewer interruptions    Time 8    Period Weeks    Status New    Target Date 07/23/21      PT LONG TERM GOAL #5   Title demosntrate full Lt LE A/ROM and P/ROM without pain limiting this to improve dressing and self-care    Time 8    Period Weeks    Status New    Target Date 07/23/21                   Plan - 06/11/21 0954     Clinical Impression Statement Pt reports 90-95% overall improvement in symptoms since the start of care.  Pt is only experiencing intermittent Lt LE pain that no longer interferes with mobility.  Pt with continued hip stiffness and core weakness and continues to benefit from exercise to improve this to prevent further injury. Pt requires tactile and verbal cues to prevent substitution and for engagement of core with stabilization exercises.  Pt will continue to benefit from skilled PT to address strength and flexibility.    PT Frequency 2x / week    PT Duration 8 weeks    PT Treatment/Interventions ADLs/Self Care Home Management;Cryotherapy;Moist Heat;Electrical Stimulation;Traction;Gait training;Functional mobility training;Therapeutic activities;Therapeutic exercise;Neuromuscular re-education;Patient/family education;Manual techniques;Dry needling;Passive range of motion;Taping;Joint Manipulations;Spinal Manipulations    PT Next Visit Plan hip and core strength and flexibility, DN to Lt adductors, multifidi and hamstrings if needed  PT Home Exercise Plan Access Code: [email or web address]    Consulted and Agree with Plan of Care Patient             Patient will benefit from skilled therapeutic intervention in order to improve the following  deficits and impairments:  Abnormal gait, Decreased activity tolerance, Decreased strength, Pain, Postural dysfunction, Decreased range of motion, Decreased mobility, Decreased endurance, Increased muscle spasms  Visit Diagnosis: Chronic bilateral low back pain with left-sided sciatica  Cramp and spasm  Stiffness of left hip, not elsewhere classified     Problem List Patient Active Problem List   Diagnosis Date Noted   Dysphonia 02/28/2021   MCI (mild cognitive impairment) 02/28/2021   Coronary artery disease involving native coronary artery of native heart without angina pectoris 10/27/2020   Gait disturbance 08/29/2020   Essential tremor 08/29/2020   Elevated coronary artery calcium score 08/24/2020   Primary parkinsonism ([place]) 05/04/2020   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   Mixed hyperlipidemia 01/25/2020   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[place]) 01/25/2020   Essential hypertension 11/04/2018   Abdominal aortic aneurysm (AAA) without rupture 07/24/2018   Sleep behavior disorder, REM 07/21/2018   Abnormal stress test 05/26/2018   Exertional dyspnea 05/26/2018   Nonischemic cardiomyopathy ([place]) 05/26/2018   Tremor observed on examination 06/25/2017   Uncontrolled REM sleep behavior disorder 03/11/2016   Nightmares REM-sleep type 03/11/2016   Lumbar degenerative disc disease 06/01/2014   Sigurd Sos, PT 06/11/21 10:11 AM   [facility] @ [place], [place], [address] Phone: [phone]   Fax:  [phone]  Name: Tyler Hayes MRN: [id number] Date of Birth: [birth year]

## 2021-06-11 NOTE — Patient Instructions (Signed)
Access Code: [email or web address] URL: [email or web address] Date: 06/11/2021 Prepared by: Claiborne Billings  Exercises  Seated Long Arc Quad - 2 x daily - 7 x weekly - 2 sets - 10 reps Supine Transversus Abdominis Bracing with Double Leg Fallout - 2 x daily - 7 x weekly - 2 sets - 10 reps Supine Hip Adduction Isometric with Ball - 2 x daily - 7 x weekly - 2 sets - 10 reps - 5 hold

## 2021-06-13 ENCOUNTER — Ambulatory Visit: Payer: Medicare Other

## 2021-06-13 ENCOUNTER — Other Ambulatory Visit: Payer: Self-pay

## 2021-06-13 DIAGNOSIS — M1990 Unspecified osteoarthritis, unspecified site: Secondary | ICD-10-CM | POA: Diagnosis not present

## 2021-06-13 DIAGNOSIS — M779 Enthesopathy, unspecified: Secondary | ICD-10-CM | POA: Diagnosis not present

## 2021-06-13 DIAGNOSIS — R252 Cramp and spasm: Secondary | ICD-10-CM | POA: Diagnosis not present

## 2021-06-13 DIAGNOSIS — R768 Other specified abnormal immunological findings in serum: Secondary | ICD-10-CM | POA: Diagnosis not present

## 2021-06-13 DIAGNOSIS — M25569 Pain in unspecified knee: Secondary | ICD-10-CM | POA: Diagnosis not present

## 2021-06-13 DIAGNOSIS — G8929 Other chronic pain: Secondary | ICD-10-CM | POA: Diagnosis not present

## 2021-06-13 DIAGNOSIS — M25652 Stiffness of left hip, not elsewhere classified: Secondary | ICD-10-CM

## 2021-06-13 DIAGNOSIS — M0609 Rheumatoid arthritis without rheumatoid factor, multiple sites: Secondary | ICD-10-CM | POA: Diagnosis not present

## 2021-06-13 DIAGNOSIS — Z1159 Encounter for screening for other viral diseases: Secondary | ICD-10-CM | POA: Diagnosis not present

## 2021-06-13 DIAGNOSIS — Z79899 Other long term (current) drug therapy: Secondary | ICD-10-CM | POA: Diagnosis not present

## 2021-06-13 DIAGNOSIS — M5442 Lumbago with sciatica, left side: Secondary | ICD-10-CM

## 2021-06-13 DIAGNOSIS — M8580 Other specified disorders of bone density and structure, unspecified site: Secondary | ICD-10-CM | POA: Diagnosis not present

## 2021-06-13 DIAGNOSIS — M5136 Other intervertebral disc degeneration, lumbar region: Secondary | ICD-10-CM | POA: Diagnosis not present

## 2021-06-13 DIAGNOSIS — M549 Dorsalgia, unspecified: Secondary | ICD-10-CM | POA: Diagnosis not present

## 2021-06-13 NOTE — Therapy (Signed)
[place] @ [place], [place], [address] Phone: [phone]   Fax:  [phone]  Physical Therapy Treatment  Patient Details  Name: Tyler Hayes MRN: [id number] Date of Birth: January 18, 1946 Referring Provider (PT): Lynne Leader, MD   Encounter Date: 06/13/2021   PT End of Session - 06/13/21 1602     Visit Number 5    Date for PT Re-Evaluation 07/23/21    Authorization Type Medicare    Progress Note Due on Visit 10    PT Start Time 1531    PT Stop Time 1602    PT Time Calculation (min) 31 min    Activity Tolerance Patient tolerated treatment well    Behavior During Therapy [facility] for tasks assessed/performed             Past Medical History:  Diagnosis Date   Arthritis    back   Cancer [facility])    prostate   Diabetes mellitus without complication ([place])    on meds   Diverticulosis    Hypercholesteremia    Hypertension    Meatal stenosis    Osteoarthritis    Sinus drainage    Tinea pedis     Past Surgical History:  Procedure Laterality Date   [place]  1984, 2013   lumbar   COLONOSCOPY  06/10/2019   Dr. Michail Sermon   DG DILATION URETERS  2003   EYE SURGERY Bilateral 1999   Lasik   LEFT HEART CATH AND CORONARY ANGIOGRAPHY N/A 05/26/2018   Procedure: LEFT HEART CATH AND CORONARY ANGIOGRAPHY;  Surgeon: Nigel Mormon, MD;  Location: [place] [facility];  Service: Cardiovascular;  Laterality: N/A;   POSTERIOR LUMBAR FUSION 4 LEVEL N/A 06/01/2014   Procedure: LUMBAR THREE TO FOUR, LUMBAR FOUR TO FIVE LAMINECTOMY,  RIGHT LUMBAR FUSION AT LUMBAR FOUR TO FIVE;  Surgeon: Floyce Stakes, MD;  [facility];  Service: Neurosurgery;  Laterality: N/A;  POSSIBLE L2-3 L3-4 L4-5 L5-S1 POSTERIOR LUMBAR INTERBODY FUSION   PROSTATECTOMY  2002    There were no vitals filed for this visit.   Subjective Assessment - 06/13/21 1532     Subjective I saw the MD today.  He was pleased with my  progress.  I will see him in 2 months.                               [facility] Adult PT Treatment/Exercise - 06/13/21 0001       Lumbar Exercises: Stretches   Active Hamstring Stretch 3 reps;Left;Right;20 seconds    Active Hamstring Stretch Limitations using step    Lower Trunk Rotation 3 reps;20 seconds    Piriformis Stretch 3 reps;Left;Right;20 seconds    Piriformis Stretch Limitations seated      Lumbar Exercises: Standing   Row Strengthening;20 reps;Theraband    Theraband Level (Row) Level 3 (Green)    Shoulder Extension Strengthening;20 reps;Theraband    Theraband Level (Shoulder Extension) Level 3 (Green)    Other Standing Lumbar Exercises pallof press green 2x10- tactile cues to reduce scapular elevation      Lumbar Exercises: Supine   Ab Set 20 reps    AB Set Limitations with ball squeeze    Clam 20 reps    Clam Limitations red band      Knee/Hip Exercises: Aerobic   Nustep Lev 4 x 8 min- PT present to discuss progress  Knee/Hip Exercises: Seated   Sit to Sand 20 reps;without UE support   holding 5# weight                      PT Short Term Goals - 05/28/21 1330       PT SHORT TERM GOAL #1   Title be independent in initial HEP    Time 4    Period Weeks    Status New    Target Date 06/25/21      PT SHORT TERM GOAL #2   Title report a 30% reduction in Lt LE pain with sitting    Time 4    Period Weeks    Status New    Target Date 06/25/21      PT SHORT TERM GOAL #3   Title fall asleep with 30% increased ease due to reduced Lt LE pain    Time 4    Period Weeks    Status New    Target Date 06/25/21               PT Long Term Goals - 05/28/21 1332       PT LONG TERM GOAL #1   Title be independent in advanced HEP    Time 8    Period Weeks    Status New    Target Date 07/23/21      PT LONG TERM GOAL #2   Title improve FOTO to > or = to 53 to improve function    Time 8    Period Weeks    Status New     Target Date 07/23/21      PT LONG TERM GOAL #3   Title report > or = to 60% reduction in Lt LE pain with sitting    Time 8    Period Weeks    Status New    Target Date 07/23/21      PT LONG TERM GOAL #4   Title sleep and fall asleep with > 70% fewer interruptions    Time 8    Period Weeks    Status New    Target Date 07/23/21      PT LONG TERM GOAL #5   Title demosntrate full Lt LE A/ROM and P/ROM without pain limiting this to improve dressing and self-care    Time 8    Period Weeks    Status New    Target Date 07/23/21                   Plan - 06/13/21 1559     Clinical Impression Statement Pt reports 90-95% overall improvement in symptoms since the start of care.  Pt is only experiencing intermittent Lt LE pain that no longer interferes with mobility.  Pt with continued hip stiffness and core weakness and continues to benefit from exercise to improve this to prevent further injury. Pt requires tactile and verbal cues to reduce scapular elevation for engagement of core with stabilization exercises.  Pt will continue to benefit from skilled PT to address strength and flexibility.    PT Frequency 2x / week    PT Duration 8 weeks    PT Treatment/Interventions ADLs/Self Care Home Management;Cryotherapy;Moist Heat;Electrical Stimulation;Traction;Gait training;Functional mobility training;Therapeutic activities;Therapeutic exercise;Neuromuscular re-education;Patient/family education;Manual techniques;Dry needling;Passive range of motion;Taping;Joint Manipulations;Spinal Manipulations    PT Next Visit Plan hip and core strength and flexibility, DN to Lt adductors, multifidi and hamstrings if needed    PT Home Exercise Plan  Access Code: [email or web address]    Consulted and Agree with Plan of Care Patient             Patient will benefit from skilled therapeutic intervention in order to improve the following deficits and impairments:  Abnormal gait, Decreased activity tolerance,  Decreased strength, Pain, Postural dysfunction, Decreased range of motion, Decreased mobility, Decreased endurance, Increased muscle spasms  Visit Diagnosis: Chronic bilateral low back pain with left-sided sciatica  Cramp and spasm  Stiffness of left hip, not elsewhere classified     Problem List Patient Active Problem List   Diagnosis Date Noted   Dysphonia 02/28/2021   MCI (mild cognitive impairment) 02/28/2021   Coronary artery disease involving native coronary artery of native heart without angina pectoris 10/27/2020   Gait disturbance 08/29/2020   Essential tremor 08/29/2020   Elevated coronary artery calcium score 08/24/2020   Primary parkinsonism ([place]) 05/04/2020   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   Mixed hyperlipidemia 01/25/2020   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[place]) 01/25/2020   Essential hypertension 11/04/2018   Abdominal aortic aneurysm (AAA) without rupture 07/24/2018   Sleep behavior disorder, REM 07/21/2018   Abnormal stress test 05/26/2018   Exertional dyspnea 05/26/2018   Nonischemic cardiomyopathy ([place]) 05/26/2018   Tremor observed on examination 06/25/2017   Uncontrolled REM sleep behavior disorder 03/11/2016   Nightmares REM-sleep type 03/11/2016   Lumbar degenerative disc disease 06/01/2014    Sigurd Sos, PT 06/13/21 4:03 PM   [facility] @ [place], [place], [address] Phone: [phone]   Fax:  [phone]  Name: Tyler Hayes MRN: [id number] Date of Birth: [birth year]

## 2021-06-18 ENCOUNTER — Other Ambulatory Visit: Payer: Self-pay

## 2021-06-18 ENCOUNTER — Ambulatory Visit: Payer: Medicare Other

## 2021-06-18 DIAGNOSIS — G8929 Other chronic pain: Secondary | ICD-10-CM

## 2021-06-18 DIAGNOSIS — M25652 Stiffness of left hip, not elsewhere classified: Secondary | ICD-10-CM

## 2021-06-18 DIAGNOSIS — R252 Cramp and spasm: Secondary | ICD-10-CM

## 2021-06-18 DIAGNOSIS — M5136 Other intervertebral disc degeneration, lumbar region: Secondary | ICD-10-CM | POA: Diagnosis not present

## 2021-06-18 NOTE — Therapy (Signed)
[facility] @ [place], [place], [address] Phone: [phone]   Fax:  [phone]  Physical Therapy Treatment  Patient Details  Name: Tyler Hayes MRN: [id number] Date of Birth: 12/14/1945 Referring Provider (PT): Lynne Leader, MD   Encounter Date: 06/18/2021   PT End of Session - 06/18/21 1005     Visit Number 6    Date for PT Re-Evaluation 07/23/21    Authorization Type Medicare    Progress Note Due on Visit 10    PT Start Time 0931    PT Stop Time 1005    PT Time Calculation (min) 34 min    Activity Tolerance Patient tolerated treatment well    Behavior During Therapy [facility] for tasks assessed/performed             Past Medical History:  Diagnosis Date   Arthritis    back   Cancer [facility])    prostate   Diabetes mellitus without complication ([place])    on meds   Diverticulosis    Hypercholesteremia    Hypertension    Meatal stenosis    Osteoarthritis    Sinus drainage    Tinea pedis     Past Surgical History:  Procedure Laterality Date   [place]  1984, 2013   lumbar   COLONOSCOPY  06/10/2019   Dr. Michail Sermon   DG DILATION URETERS  2003   EYE SURGERY Bilateral 1999   Lasik   LEFT HEART CATH AND CORONARY ANGIOGRAPHY N/A 05/26/2018   Procedure: LEFT HEART CATH AND CORONARY ANGIOGRAPHY;  Surgeon: Nigel Mormon, MD;  Location: [facility] [place] [facility];  Service: Cardiovascular;  Laterality: N/A;   POSTERIOR LUMBAR FUSION 4 LEVEL N/A 06/01/2014   Procedure: LUMBAR THREE TO FOUR, LUMBAR FOUR TO FIVE LAMINECTOMY,  RIGHT LUMBAR FUSION AT LUMBAR FOUR TO FIVE;  Surgeon: Floyce Stakes, MD;  [facility];  Service: Neurosurgery;  Laterality: N/A;  POSSIBLE L2-3 L3-4 L4-5 L5-S1 POSTERIOR LUMBAR INTERBODY FUSION   PROSTATECTOMY  2002    There were no vitals filed for this visit.   Subjective Assessment - 06/18/21 0932     Subjective I'm still feeling really good.  Brief positional  pain that is very brief.    Pertinent History Parkinson's disease, lumbar fusion L4/5, prostate cancer, DM, HTN    Patient Stated Goals reduce Lt LE pain, sit, stand, walk without limitation    Currently in Pain? No/denies                               [facility] Adult PT Treatment/Exercise - 06/18/21 0001       Lumbar Exercises: Stretches   Active Hamstring Stretch 3 reps;Left;Right;20 seconds    Active Hamstring Stretch Limitations using step    Lower Trunk Rotation 3 reps;20 seconds    Piriformis Stretch 3 reps;Left;Right;20 seconds    Piriformis Stretch Limitations seated      Lumbar Exercises: Standing   Row Strengthening;20 reps;Theraband    Theraband Level (Row) Level 3 (Green)    Shoulder Extension Strengthening;20 reps;Theraband    Theraband Level (Shoulder Extension) Level 3 (Green)    Other Standing Lumbar Exercises pallof press green 2x10- tactile cues to reduce scapular elevation      Lumbar Exercises: Supine   Ab Set 20 reps    AB Set Limitations with ball squeeze    Clam 20 reps  Clam Limitations red band      Knee/Hip Exercises: Aerobic   Nustep Lev 4 x 8 min- PT present to discuss progress      Knee/Hip Exercises: Seated   Sit to Sand 20 reps;without UE support   no weight, focus on eccentric control                      PT Short Term Goals - 06/18/21 0947       PT SHORT TERM GOAL #1   Title be independent in initial HEP    Status Achieved      PT SHORT TERM GOAL #2   Title report a 30% reduction in Lt LE pain with sitting    Status Achieved      PT SHORT TERM GOAL #3   Title fall asleep with 30% increased ease due to reduced Lt LE pain    Status Achieved               PT Long Term Goals - 05/28/21 1332       PT LONG TERM GOAL #1   Title be independent in advanced HEP    Time 8    Period Weeks    Status New    Target Date 07/23/21      PT LONG TERM GOAL #2   Title improve FOTO to > or = to 53 to  improve function    Time 8    Period Weeks    Status New    Target Date 07/23/21      PT LONG TERM GOAL #3   Title report > or = to 60% reduction in Lt LE pain with sitting    Time 8    Period Weeks    Status New    Target Date 07/23/21      PT LONG TERM GOAL #4   Title sleep and fall asleep with > 70% fewer interruptions    Time 8    Period Weeks    Status New    Target Date 07/23/21      PT LONG TERM GOAL #5   Title demosntrate full Lt LE A/ROM and P/ROM without pain limiting this to improve dressing and self-care    Time 8    Period Weeks    Status New    Target Date 07/23/21                   Plan - 06/18/21 0946     Clinical Impression Statement Pt continues to report 90-95% overall improvement in symptoms since the start of care.  Pt is only experiencing intermittent Lt LE pain that no longer interferes with mobility. Pt is no longer having difficulty with falling asleep. Pt with continued hip stiffness and core weakness and continues to benefit from exercise to improve this to prevent further injury.  Pt with reduced scapular elevation with rows and shoulder extension and improved upright posture without cueing. verbal and demo cueing for eccentric control with stand to sit transition.  Pt will continue to benefit from skilled PT to address strength and flexibility.    PT Frequency 2x / week    PT Duration 8 weeks    PT Treatment/Interventions ADLs/Self Care Home Management;Cryotherapy;Moist Heat;Electrical Stimulation;Traction;Gait training;Functional mobility training;Therapeutic activities;Therapeutic exercise;Neuromuscular re-education;Patient/family education;Manual techniques;Dry needling;Passive range of motion;Taping;Joint Manipulations;Spinal Manipulations    PT Next Visit Plan hip and core strength and flexibility, DN and manual work as needed  PT Home Exercise Plan Access Code: [email or web address]    Consulted and Agree with Plan of Care Patient              Patient will benefit from skilled therapeutic intervention in order to improve the following deficits and impairments:  Abnormal gait, Decreased activity tolerance, Decreased strength, Pain, Postural dysfunction, Decreased range of motion, Decreased mobility, Decreased endurance, Increased muscle spasms  Visit Diagnosis: Chronic bilateral low back pain with left-sided sciatica  Cramp and spasm  Stiffness of left hip, not elsewhere classified     Problem List Patient Active Problem List   Diagnosis Date Noted   Dysphonia 02/28/2021   MCI (mild cognitive impairment) 02/28/2021   Coronary artery disease involving native coronary artery of native heart without angina pectoris 10/27/2020   Gait disturbance 08/29/2020   Essential tremor 08/29/2020   Elevated coronary artery calcium score 08/24/2020   Primary parkinsonism ([place]) 05/04/2020   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   Mixed hyperlipidemia 01/25/2020   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[place]) 01/25/2020   Essential hypertension 11/04/2018   Abdominal aortic aneurysm (AAA) without rupture 07/24/2018   Sleep behavior disorder, REM 07/21/2018   Abnormal stress test 05/26/2018   Exertional dyspnea 05/26/2018   Nonischemic cardiomyopathy ([place]) 05/26/2018   Tremor observed on examination 06/25/2017   Uncontrolled REM sleep behavior disorder 03/11/2016   Nightmares REM-sleep type 03/11/2016   Lumbar degenerative disc disease 06/01/2014    Sigurd Sos, PT 06/18/21 10:07 AM   [place] @ [place], [place], [address] Phone: [phone]   Fax:  [phone]  Name: Tyler Hayes MRN: [id number] Date of Birth: Sep 30, 1945

## 2021-06-20 ENCOUNTER — Other Ambulatory Visit: Payer: Self-pay

## 2021-06-20 ENCOUNTER — Ambulatory Visit: Payer: Medicare Other | Attending: Family Medicine

## 2021-06-20 DIAGNOSIS — R252 Cramp and spasm: Secondary | ICD-10-CM | POA: Insufficient documentation

## 2021-06-20 DIAGNOSIS — M5442 Lumbago with sciatica, left side: Secondary | ICD-10-CM | POA: Insufficient documentation

## 2021-06-20 DIAGNOSIS — G8929 Other chronic pain: Secondary | ICD-10-CM | POA: Insufficient documentation

## 2021-06-20 DIAGNOSIS — M25652 Stiffness of left hip, not elsewhere classified: Secondary | ICD-10-CM | POA: Diagnosis not present

## 2021-06-20 NOTE — Therapy (Signed)
[place] @ [place], [place], [address] Phone: [phone]   Fax:  [phone]  Physical Therapy Treatment  Patient Details  Name: Tyler Hayes MRN: [id number] Date of Birth: 10-31-1945 Referring Provider (PT): Lynne Leader, MD   Encounter Date: 06/20/2021   PT End of Session - 06/20/21 1055     Visit Number 7    Date for PT Re-Evaluation 07/23/21    Authorization Type Medicare    Progress Note Due on Visit 10    PT Start Time 1017    PT Stop Time 1050    PT Time Calculation (min) 33 min    Activity Tolerance Patient tolerated treatment well    Behavior During Therapy [facility] for tasks assessed/performed             Past Medical History:  Diagnosis Date   Arthritis    back   Cancer [facility])    prostate   Diabetes mellitus without complication ([place])    on meds   Diverticulosis    Hypercholesteremia    Hypertension    Meatal stenosis    Osteoarthritis    Sinus drainage    Tinea pedis     Past Surgical History:  Procedure Laterality Date   [place]  1984, 2013   lumbar   COLONOSCOPY  06/10/2019   Dr. Michail Sermon   DG DILATION URETERS  2003   EYE SURGERY Bilateral 1999   Lasik   LEFT HEART CATH AND CORONARY ANGIOGRAPHY N/A 05/26/2018   Procedure: LEFT HEART CATH AND CORONARY ANGIOGRAPHY;  Surgeon: Nigel Mormon, MD;  Location: [place] [facility];  Service: Cardiovascular;  Laterality: N/A;   POSTERIOR LUMBAR FUSION 4 LEVEL N/A 06/01/2014   Procedure: LUMBAR THREE TO FOUR, LUMBAR FOUR TO FIVE LAMINECTOMY,  RIGHT LUMBAR FUSION AT LUMBAR FOUR TO FIVE;  Surgeon: Floyce Stakes, MD;  [facility];  Service: Neurosurgery;  Laterality: N/A;  POSSIBLE L2-3 L3-4 L4-5 L5-S1 POSTERIOR LUMBAR INTERBODY FUSION   PROSTATECTOMY  2002    There were no vitals filed for this visit.   Subjective Assessment - 06/20/21 1024     Subjective My leg is still feeling good.  Therapy has really  helped me.    Pertinent History Parkinson's disease, lumbar fusion L4/5, prostate cancer, DM, HTN    Currently in Pain? No/denies                               [facility] Adult PT Treatment/Exercise - 06/20/21 0001       Lumbar Exercises: Stretches   Active Hamstring Stretch 3 reps;Left;Right;20 seconds    Active Hamstring Stretch Limitations using step    Lower Trunk Rotation 3 reps;20 seconds    Piriformis Stretch 3 reps;Left;Right;20 seconds    Piriformis Stretch Limitations seated      Lumbar Exercises: Seated   Other Seated Lumbar Exercises press into foam roll 5" hold x 20    Other Seated Lumbar Exercises seated on balance pad: 3 way raises 2# added 2x10 bil each with core activation      Lumbar Exercises: Supine   Clam 20 reps    Clam Limitations red band      Knee/Hip Exercises: Aerobic   Nustep Lev 4 x 8 min- PT present to discuss progress  PT Short Term Goals - 06/18/21 0947       PT SHORT TERM GOAL #1   Title be independent in initial HEP    Status Achieved      PT SHORT TERM GOAL #2   Title report a 30% reduction in Lt LE pain with sitting    Status Achieved      PT SHORT TERM GOAL #3   Title fall asleep with 30% increased ease due to reduced Lt LE pain    Status Achieved               PT Long Term Goals - 06/20/21 1026       PT LONG TERM GOAL #1   Title be independent in advanced HEP    Status On-going      PT LONG TERM GOAL #3   Title report > or = to 60% reduction in Lt LE pain with sitting    Status Achieved                   Plan - 06/20/21 1046     Clinical Impression Statement Pt is only experiencing intermittent Lt LE pain that no longer interferes with mobility and pt is sleeping without waking with pain.  Pt with continued hip stiffness and core weakness and continues to benefit from exercise to improve this to prevent further injury.  Tactile and verbal cues are required  to reduce trunk extension with sitting activities. Pt with improved control with stand to sit transition with initial reps today.  Pt will continue to benefit from skilled PT to address strength and flexibility.    PT Frequency 2x / week    PT Duration 8 weeks    PT Treatment/Interventions ADLs/Self Care Home Management;Cryotherapy;Moist Heat;Electrical Stimulation;Traction;Gait training;Functional mobility training;Therapeutic activities;Therapeutic exercise;Neuromuscular re-education;Patient/family education;Manual techniques;Dry needling;Passive range of motion;Taping;Joint Manipulations;Spinal Manipulations    PT Next Visit Plan hip and core strength and flexibility, DN and manual work as needed.  2 more sessions probable and then hold until MD appt             Patient will benefit from skilled therapeutic intervention in order to improve the following deficits and impairments:  Abnormal gait, Decreased activity tolerance, Decreased strength, Pain, Postural dysfunction, Decreased range of motion, Decreased mobility, Decreased endurance, Increased muscle spasms  Visit Diagnosis: Chronic bilateral low back pain with left-sided sciatica  Cramp and spasm  Stiffness of left hip, not elsewhere classified     Problem List Patient Active Problem List   Diagnosis Date Noted   Dysphonia 02/28/2021   MCI (mild cognitive impairment) 02/28/2021   Coronary artery disease involving native coronary artery of native heart without angina pectoris 10/27/2020   Gait disturbance 08/29/2020   Essential tremor 08/29/2020   Elevated coronary artery calcium score 08/24/2020   Primary parkinsonism ([place]) 05/04/2020   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   Mixed hyperlipidemia 01/25/2020   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[place]) 01/25/2020   Essential hypertension 11/04/2018   Abdominal aortic aneurysm (AAA) without rupture 07/24/2018   Sleep behavior disorder,  REM 07/21/2018   Abnormal stress test 05/26/2018   Exertional dyspnea 05/26/2018   Nonischemic cardiomyopathy ([place]) 05/26/2018   Tremor observed on examination 06/25/2017   Uncontrolled REM sleep behavior disorder 03/11/2016   Nightmares REM-sleep type 03/11/2016   Lumbar degenerative disc disease 06/01/2014   Sigurd Sos, PT 06/20/21 10:57 AM   [place] @ [place], [place], [address] Phone: [phone]   Fax:  [phone]  Name: Jaidon Ellery MRN: [id number] Date of Birth: 15-Nov-1945

## 2021-06-25 ENCOUNTER — Other Ambulatory Visit: Payer: Self-pay

## 2021-06-25 ENCOUNTER — Ambulatory Visit: Payer: Medicare Other

## 2021-06-25 DIAGNOSIS — M5442 Lumbago with sciatica, left side: Secondary | ICD-10-CM | POA: Diagnosis not present

## 2021-06-25 DIAGNOSIS — M25652 Stiffness of left hip, not elsewhere classified: Secondary | ICD-10-CM | POA: Diagnosis not present

## 2021-06-25 DIAGNOSIS — R252 Cramp and spasm: Secondary | ICD-10-CM | POA: Diagnosis not present

## 2021-06-25 DIAGNOSIS — G8929 Other chronic pain: Secondary | ICD-10-CM

## 2021-06-25 NOTE — Therapy (Signed)
[facility] @ [address] [place], [place], [address] Phone: [phone]   Fax:  [phone]  Physical Therapy Treatment  Patient Details  Name: Tyler Hayes MRN: [id number] Date of Birth: 10/12/1945 Referring Provider (PT): Lynne Leader, MD   Encounter Date: 06/25/2021   PT End of Session - 06/25/21 1136     Visit Number 8    Date for PT Re-Evaluation 07/23/21    Authorization Type Medicare    Progress Note Due on Visit 10    PT Start Time 1101    PT Stop Time 1136    PT Time Calculation (min) 35 min    Activity Tolerance Patient tolerated treatment well    Behavior During Therapy [facility] for tasks assessed/performed             Past Medical History:  Diagnosis Date   Arthritis    back   Cancer [facility])    prostate   Diabetes mellitus without complication ([place])    on meds   Diverticulosis    Hypercholesteremia    Hypertension    Meatal stenosis    Osteoarthritis    Sinus drainage    Tinea pedis     Past Surgical History:  Procedure Laterality Date   [place]  1984, 2013   lumbar   COLONOSCOPY  06/10/2019   Dr. Michail Sermon   DG DILATION URETERS  2003   EYE SURGERY Bilateral 1999   Lasik   LEFT HEART CATH AND CORONARY ANGIOGRAPHY N/A 05/26/2018   Procedure: LEFT HEART CATH AND CORONARY ANGIOGRAPHY;  Surgeon: Nigel Mormon, MD;  Location: [place] [facility];  Service: Cardiovascular;  Laterality: N/A;   POSTERIOR LUMBAR FUSION 4 LEVEL N/A 06/01/2014   Procedure: LUMBAR THREE TO FOUR, LUMBAR FOUR TO FIVE LAMINECTOMY,  RIGHT LUMBAR FUSION AT LUMBAR FOUR TO FIVE;  Surgeon: Floyce Stakes, MD;  [facility];  Service: Neurosurgery;  Laterality: N/A;  POSSIBLE L2-3 L3-4 L4-5 L5-S1 POSTERIOR LUMBAR INTERBODY FUSION   PROSTATECTOMY  2002    There were no vitals filed for this visit.   Subjective Assessment - 06/25/21 1102     Subjective I am still feeling really good.  Working on getting  stronger at home.    Pertinent History Parkinson's disease, lumbar fusion L4/5, prostate cancer, DM, HTN    Currently in Pain? No/denies                               [facility] Adult PT Treatment/Exercise - 06/25/21 0001       Lumbar Exercises: Stretches   Active Hamstring Stretch 3 reps;Left;Right;20 seconds    Active Hamstring Stretch Limitations using step    Piriformis Stretch 3 reps;Left;Right;20 seconds    Piriformis Stretch Limitations seated      Lumbar Exercises: Standing   Other Standing Lumbar Exercises walking in reverse: 15# with elbows back and scapula engaged 2x10      Lumbar Exercises: Seated   Other Seated Lumbar Exercises press into foam roll 5" hold x 20    Other Seated Lumbar Exercises seated on balance pad: 3 way raises 2# added 2x10 bil each with core activation.   Seated on balance pad with ER with green band 2x10      Knee/Hip Exercises: Aerobic   Nustep Lev 4 x 8 min- PT present to discuss progress      Knee/Hip Exercises: Standing  Hip Abduction Stengthening;Both;2 sets;10 reps    Hip Extension Stengthening;Both;2 sets;10 reps    Extension Limitations standing on balance pad      Knee/Hip Exercises: Seated   Sit to Sand 20 reps;without UE support   no weight, focus on eccentric control                      PT Short Term Goals - 06/18/21 0947       PT SHORT TERM GOAL #1   Title be independent in initial HEP    Status Achieved      PT SHORT TERM GOAL #2   Title report a 30% reduction in Lt LE pain with sitting    Status Achieved      PT SHORT TERM GOAL #3   Title fall asleep with 30% increased ease due to reduced Lt LE pain    Status Achieved               PT Long Term Goals - 06/20/21 1026       PT LONG TERM GOAL #1   Title be independent in advanced HEP    Status On-going      PT LONG TERM GOAL #3   Title report > or = to 60% reduction in Lt LE pain with sitting    Status Achieved                    Plan - 06/25/21 1109     Clinical Impression Statement Pt continues to report that his Lt LE symptoms have resolved.  Pt with continued hip stiffness and core weakness and continues to benefit from exercise to improve this to prevent further injury.  Pt is showing improved stability, control against gravity and alignment with exercises with fewer tactile cues.  Pt will continue to benefit from skilled PT to address strength and flexibility.    PT Treatment/Interventions ADLs/Self Care Home Management;Cryotherapy;Moist Heat;Electrical Stimulation;Traction;Gait training;Functional mobility training;Therapeutic activities;Therapeutic exercise;Neuromuscular re-education;Patient/family education;Manual techniques;Dry needling;Passive range of motion;Taping;Joint Manipulations;Spinal Manipulations    PT Next Visit Plan 1 more session probable and will hold chart after    PT Home Exercise Plan Access Code: [email or web address]    Consulted and Agree with Plan of Care Patient             Patient will benefit from skilled therapeutic intervention in order to improve the following deficits and impairments:  Abnormal gait, Decreased activity tolerance, Decreased strength, Pain, Postural dysfunction, Decreased range of motion, Decreased mobility, Decreased endurance, Increased muscle spasms  Visit Diagnosis: Stiffness of left hip, not elsewhere classified  Cramp and spasm  Chronic bilateral low back pain with left-sided sciatica     Problem List Patient Active Problem List   Diagnosis Date Noted   Dysphonia 02/28/2021   MCI (mild cognitive impairment) 02/28/2021   Coronary artery disease involving native coronary artery of native heart without angina pectoris 10/27/2020   Gait disturbance 08/29/2020   Essential tremor 08/29/2020   Elevated coronary artery calcium score 08/24/2020   Primary parkinsonism ([place]) 05/04/2020   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   Mixed hyperlipidemia  01/25/2020   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[place]) 01/25/2020   Essential hypertension 11/04/2018   Abdominal aortic aneurysm (AAA) without rupture 07/24/2018   Sleep behavior disorder, REM 07/21/2018   Abnormal stress test 05/26/2018   Exertional dyspnea 05/26/2018   Nonischemic cardiomyopathy ([place]) 05/26/2018   Tremor observed on examination 06/25/2017  Uncontrolled REM sleep behavior disorder 03/11/2016   Nightmares REM-sleep type 03/11/2016   Lumbar degenerative disc disease 06/01/2014    Sigurd Sos, PT 06/25/21 11:38 AM   [facility] @ [address] [place], [place], [address] Phone: [phone]   Fax:  [phone]  Name: Tyler Hayes MRN: [id number] Date of Birth: July 17, 1945

## 2021-06-26 DIAGNOSIS — M0609 Rheumatoid arthritis without rheumatoid factor, multiple sites: Secondary | ICD-10-CM | POA: Diagnosis not present

## 2021-06-27 ENCOUNTER — Other Ambulatory Visit: Payer: Self-pay

## 2021-06-27 ENCOUNTER — Ambulatory Visit: Payer: Medicare Other

## 2021-06-27 DIAGNOSIS — M5442 Lumbago with sciatica, left side: Secondary | ICD-10-CM | POA: Diagnosis not present

## 2021-06-27 DIAGNOSIS — R252 Cramp and spasm: Secondary | ICD-10-CM

## 2021-06-27 DIAGNOSIS — G8929 Other chronic pain: Secondary | ICD-10-CM

## 2021-06-27 DIAGNOSIS — M25652 Stiffness of left hip, not elsewhere classified: Secondary | ICD-10-CM

## 2021-06-27 NOTE — Therapy (Signed)
[facility] @ [address] [place], [place], [address] Phone: [phone]   Fax:  [phone]  Physical Therapy Treatment  Patient Details  Name: Tyler Hayes MRN: [id number] Date of Birth: Nov 13, 1945 Referring Provider (PT): Lynne Leader, MD   Encounter Date: 06/27/2021   PT End of Session - 06/27/21 1051     Visit Number 9    Date for PT Re-Evaluation 07/23/21    Authorization Type Medicare    Progress Note Due on Visit 10    PT Start Time 1016    PT Stop Time 1049    PT Time Calculation (min) 33 min    Activity Tolerance Patient tolerated treatment well    Behavior During Therapy [facility] for tasks assessed/performed             Past Medical History:  Diagnosis Date   Arthritis    back   Cancer [facility])    prostate   Diabetes mellitus without complication ([place])    on meds   Diverticulosis    Hypercholesteremia    Hypertension    Meatal stenosis    Osteoarthritis    Sinus drainage    Tinea pedis     Past Surgical History:  Procedure Laterality Date   [place]  1984, 2013   lumbar   COLONOSCOPY  06/10/2019   Dr. Michail Sermon   DG DILATION URETERS  2003   EYE SURGERY Bilateral 1999   Lasik   LEFT HEART CATH AND CORONARY ANGIOGRAPHY N/A 05/26/2018   Procedure: LEFT HEART CATH AND CORONARY ANGIOGRAPHY;  Surgeon: Nigel Mormon, MD;  Location: [place] [facility];  Service: Cardiovascular;  Laterality: N/A;   POSTERIOR LUMBAR FUSION 4 LEVEL N/A 06/01/2014   Procedure: LUMBAR THREE TO FOUR, LUMBAR FOUR TO FIVE LAMINECTOMY,  RIGHT LUMBAR FUSION AT LUMBAR FOUR TO FIVE;  Surgeon: Floyce Stakes, MD;  [facility];  Service: Neurosurgery;  Laterality: N/A;  POSSIBLE L2-3 L3-4 L4-5 L5-S1 POSTERIOR LUMBAR INTERBODY FUSION   PROSTATECTOMY  2002    There were no vitals filed for this visit.   Subjective Assessment - 06/27/21 1013     Subjective I feel 95% better overall.  No longer having leg  symptoms.    Currently in Pain? No/denies                [facility] PT Assessment - 06/27/21 0001       Assessment   Medical Diagnosis Lumbar degenerative disc disease    Referring Provider (PT) Lynne Leader, MD    Onset Date/Surgical Date 03/28/21    Next MD Visit 8 weeks      [place] residence    Living Arrangements Spouse/significant other      Prior Function   Level of [place] Retired      Associate Professor   Overall Cognitive Status Within Functional Limits for tasks assessed      Transfers   Transfers Independent with all Transfers                           [facility] Adult PT Treatment/Exercise - 06/27/21 0001       Lumbar Exercises: Stretches   Active Hamstring Stretch 3 reps;Left;Right;20 seconds    Piriformis Stretch 3 reps;Left;Right;20 seconds    Piriformis Stretch Limitations seated      Lumbar Exercises: Standing   Other Standing  Lumbar Exercises walking in reverse: 15# with elbows back and scapula engaged 2x10      Lumbar Exercises: Seated   Other Seated Lumbar Exercises seated on balance pad: 3 way raises 2# added 2x10 bil each with core activation.      Knee/Hip Exercises: Aerobic   Nustep Lev 4 x 8 min- PT present to discuss progress      Knee/Hip Exercises: Standing   Hip Abduction Stengthening;Both;2 sets;10 reps    Hip Extension Stengthening;Both;2 sets;10 reps    Extension Limitations standing on balance pad      Knee/Hip Exercises: Seated   Sit to Sand 20 reps;without UE support   no weight, focus on eccentric control                      PT Short Term Goals - 06/18/21 0947       PT SHORT TERM GOAL #1   Title be independent in initial HEP    Status Achieved      PT SHORT TERM GOAL #2   Title report a 30% reduction in Lt LE pain with sitting    Status Achieved      PT SHORT TERM GOAL #3   Title fall asleep with 30% increased ease due to reduced  Lt LE pain    Status Achieved               PT Long Term Goals - 06/27/21 1021       PT LONG TERM GOAL #1   Title be independent in advanced HEP    Status Achieved      PT LONG TERM GOAL #3   Title report > or = to 60% reduction in Lt LE pain with sitting    Baseline 95%    Status Achieved      PT LONG TERM GOAL #4   Title sleep and fall asleep with > 70% fewer interruptions    Status Achieved      PT LONG TERM GOAL #5   Title demosntrate full Lt LE A/ROM and P/ROM without pain limiting this to improve dressing and self-care    Status Achieved                   Plan - 06/27/21 1037     Clinical Impression Statement Pt continues to report that his Lt LE symptoms have resolved. Pt is independent in HEP and regular gym exercises.  Pt with continued hip stiffness and core weakness and continues to benefit from exercise to improve this to prevent further injury.  Pt is showing improved stability, control against gravity and alignment with exercises with fewer tactile cues.  Pt will be placed on hold x 30 days and PT will D/C if pt doesnt return.    PT Next Visit Plan hold x 30 days and D/C if pt doesn't return    PT Home Exercise Plan Access Code: [email or web address]    Consulted and Agree with Plan of Care Patient             Patient will benefit from skilled therapeutic intervention in order to improve the following deficits and impairments:  Abnormal gait, Decreased activity tolerance, Decreased strength, Pain, Postural dysfunction, Decreased range of motion, Decreased mobility, Decreased endurance, Increased muscle spasms  Visit Diagnosis: Stiffness of left hip, not elsewhere classified  Cramp and spasm  Chronic bilateral low back pain with left-sided sciatica     Problem List Patient Active Problem List  Diagnosis Date Noted   Dysphonia 02/28/2021   MCI (mild cognitive impairment) 02/28/2021   Coronary artery disease involving native coronary artery of  native heart without angina pectoris 10/27/2020   Gait disturbance 08/29/2020   Essential tremor 08/29/2020   Elevated coronary artery calcium score 08/24/2020   Primary parkinsonism ([place]) 05/04/2020   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   Mixed hyperlipidemia 01/25/2020   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[place]) 01/25/2020   Essential hypertension 11/04/2018   Abdominal aortic aneurysm (AAA) without rupture 07/24/2018   Sleep behavior disorder, REM 07/21/2018   Abnormal stress test 05/26/2018   Exertional dyspnea 05/26/2018   Nonischemic cardiomyopathy ([place]) 05/26/2018   Tremor observed on examination 06/25/2017   Uncontrolled REM sleep behavior disorder 03/11/2016   Nightmares REM-sleep type 03/11/2016   Lumbar degenerative disc disease 06/01/2014   Sigurd Sos, PT 06/27/21 10:53 AM   [facility] @ [place], [place], [address] Phone: [phone]   Fax:  [phone]  Name: Tyler Hayes MRN: [id number] Date of Birth: 01-27-1946

## 2021-07-11 ENCOUNTER — Ambulatory Visit: Payer: Medicare Other | Admitting: Family Medicine

## 2021-07-11 DIAGNOSIS — E118 Type 2 diabetes mellitus with unspecified complications: Secondary | ICD-10-CM | POA: Diagnosis not present

## 2021-07-11 DIAGNOSIS — Z125 Encounter for screening for malignant neoplasm of prostate: Secondary | ICD-10-CM | POA: Diagnosis not present

## 2021-07-11 DIAGNOSIS — I10 Essential (primary) hypertension: Secondary | ICD-10-CM | POA: Diagnosis not present

## 2021-07-11 DIAGNOSIS — E7800 Pure hypercholesterolemia, unspecified: Secondary | ICD-10-CM | POA: Diagnosis not present

## 2021-07-11 DIAGNOSIS — E039 Hypothyroidism, unspecified: Secondary | ICD-10-CM | POA: Diagnosis not present

## 2021-07-11 DIAGNOSIS — E119 Type 2 diabetes mellitus without complications: Secondary | ICD-10-CM | POA: Diagnosis not present

## 2021-07-11 DIAGNOSIS — M0609 Rheumatoid arthritis without rheumatoid factor, multiple sites: Secondary | ICD-10-CM | POA: Diagnosis not present

## 2021-07-13 DIAGNOSIS — G4752 REM sleep behavior disorder: Secondary | ICD-10-CM | POA: Diagnosis not present

## 2021-07-13 DIAGNOSIS — I2510 Atherosclerotic heart disease of native coronary artery without angina pectoris: Secondary | ICD-10-CM | POA: Diagnosis not present

## 2021-07-13 DIAGNOSIS — Z0000 Encounter for general adult medical examination without abnormal findings: Secondary | ICD-10-CM | POA: Diagnosis not present

## 2021-07-13 DIAGNOSIS — R918 Other nonspecific abnormal finding of lung field: Secondary | ICD-10-CM | POA: Diagnosis not present

## 2021-07-13 DIAGNOSIS — I10 Essential (primary) hypertension: Secondary | ICD-10-CM | POA: Diagnosis not present

## 2021-07-13 DIAGNOSIS — E118 Type 2 diabetes mellitus with unspecified complications: Secondary | ICD-10-CM | POA: Diagnosis not present

## 2021-07-13 DIAGNOSIS — J309 Allergic rhinitis, unspecified: Secondary | ICD-10-CM | POA: Diagnosis not present

## 2021-07-13 DIAGNOSIS — G20 Parkinson's disease: Secondary | ICD-10-CM | POA: Diagnosis not present

## 2021-07-13 DIAGNOSIS — M0609 Rheumatoid arthritis without rheumatoid factor, multiple sites: Secondary | ICD-10-CM | POA: Diagnosis not present

## 2021-07-13 DIAGNOSIS — I714 Abdominal aortic aneurysm, without rupture, unspecified: Secondary | ICD-10-CM | POA: Diagnosis not present

## 2021-07-13 DIAGNOSIS — I428 Other cardiomyopathies: Secondary | ICD-10-CM | POA: Diagnosis not present

## 2021-07-25 DIAGNOSIS — M0609 Rheumatoid arthritis without rheumatoid factor, multiple sites: Secondary | ICD-10-CM | POA: Diagnosis not present

## 2021-07-26 DIAGNOSIS — I10 Essential (primary) hypertension: Secondary | ICD-10-CM | POA: Diagnosis not present

## 2021-07-26 DIAGNOSIS — E118 Type 2 diabetes mellitus with unspecified complications: Secondary | ICD-10-CM | POA: Diagnosis not present

## 2021-07-26 DIAGNOSIS — I428 Other cardiomyopathies: Secondary | ICD-10-CM | POA: Diagnosis not present

## 2021-07-26 DIAGNOSIS — E7800 Pure hypercholesterolemia, unspecified: Secondary | ICD-10-CM | POA: Diagnosis not present

## 2021-08-02 ENCOUNTER — Other Ambulatory Visit: Payer: Medicare Other

## 2021-08-03 ENCOUNTER — Other Ambulatory Visit: Payer: Self-pay

## 2021-08-03 ENCOUNTER — Ambulatory Visit
Admission: RE | Admit: 2021-08-03 | Discharge: 2021-08-03 | Disposition: A | Discharge status: Home / Self Care | Payer: Medicare Other | Source: Ambulatory Visit | Attending: Internal Medicine | Admitting: Internal Medicine

## 2021-08-03 DIAGNOSIS — R918 Other nonspecific abnormal finding of lung field: Secondary | ICD-10-CM | POA: Diagnosis not present

## 2021-08-03 DIAGNOSIS — R0602 Shortness of breath: Secondary | ICD-10-CM | POA: Diagnosis not present

## 2021-08-05 ENCOUNTER — Other Ambulatory Visit: Payer: Self-pay | Admitting: Neurology

## 2021-08-07 DIAGNOSIS — E7800 Pure hypercholesterolemia, unspecified: Secondary | ICD-10-CM | POA: Diagnosis not present

## 2021-08-07 DIAGNOSIS — I10 Essential (primary) hypertension: Secondary | ICD-10-CM | POA: Diagnosis not present

## 2021-08-07 DIAGNOSIS — I428 Other cardiomyopathies: Secondary | ICD-10-CM | POA: Diagnosis not present

## 2021-08-07 DIAGNOSIS — E118 Type 2 diabetes mellitus with unspecified complications: Secondary | ICD-10-CM | POA: Diagnosis not present

## 2021-08-15 ENCOUNTER — Ambulatory Visit: Payer: Medicare Other | Admitting: Cardiology

## 2021-08-15 ENCOUNTER — Encounter: Payer: Self-pay | Admitting: Cardiology

## 2021-08-15 ENCOUNTER — Other Ambulatory Visit: Payer: Self-pay

## 2021-08-15 VITALS — BP 128/74 | HR 100 | Temp 98.0°F | Resp 16 | Ht 70.0 in | Wt 180.0 lb

## 2021-08-15 DIAGNOSIS — M2550 Pain in unspecified joint: Secondary | ICD-10-CM | POA: Diagnosis not present

## 2021-08-15 DIAGNOSIS — R931 Abnormal findings on diagnostic imaging of heart and coronary circulation: Secondary | ICD-10-CM

## 2021-08-15 DIAGNOSIS — M25569 Pain in unspecified knee: Secondary | ICD-10-CM | POA: Diagnosis not present

## 2021-08-15 DIAGNOSIS — Z79899 Other long term (current) drug therapy: Secondary | ICD-10-CM | POA: Diagnosis not present

## 2021-08-15 DIAGNOSIS — M8580 Other specified disorders of bone density and structure, unspecified site: Secondary | ICD-10-CM | POA: Diagnosis not present

## 2021-08-15 DIAGNOSIS — E782 Mixed hyperlipidemia: Secondary | ICD-10-CM

## 2021-08-15 DIAGNOSIS — I428 Other cardiomyopathies: Secondary | ICD-10-CM | POA: Diagnosis not present

## 2021-08-15 DIAGNOSIS — I10 Essential (primary) hypertension: Secondary | ICD-10-CM | POA: Diagnosis not present

## 2021-08-15 DIAGNOSIS — M0609 Rheumatoid arthritis without rheumatoid factor, multiple sites: Secondary | ICD-10-CM | POA: Diagnosis not present

## 2021-08-15 DIAGNOSIS — M1990 Unspecified osteoarthritis, unspecified site: Secondary | ICD-10-CM | POA: Diagnosis not present

## 2021-08-15 DIAGNOSIS — R918 Other nonspecific abnormal finding of lung field: Secondary | ICD-10-CM | POA: Diagnosis not present

## 2021-08-15 DIAGNOSIS — R768 Other specified abnormal immunological findings in serum: Secondary | ICD-10-CM | POA: Diagnosis not present

## 2021-08-15 NOTE — Progress Notes (Signed)
? ? ? ?Patient is here for follow up visit. ? ?Subjective:  ? ?Tyler Hayes, male    DOB: 1946-05-06, 76 y.o.   MRN: [id number] ? ? ?Chief Complaint  ?Patient presents with  ? Coronary Artery Disease  ? Hypertension  ? Follow-up  ?  6 month  ? ? ? ?HPI ? ? 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 ?Patient has recently had difficulty controlling her blood pressure. Blood pressure went up after stopping Triamterene. Since then, losartan was increased from 2 to up to 100 mg daily, which he takes in the morning. Carvedilol 3.125 mg also added, knowing his hypertension and underlying cardiomyopathy. He only takes carvedilol in the evening. Blood pressure is now better in the evenings, but still elevated in the mornings. Leg swelling has improved after taking lasix.  ? ? ?Current Outpatient Medications:  ?  albuterol (VENTOLIN HFA) 108 (90 Base) MCG/ACT inhaler, Inhale into the lungs., Disp: , Rfl:  ?  aspirin 81 MG chewable tablet, 1 tablet, Disp: , Rfl:  ?  atorvastatin (LIPITOR) 40 MG tablet, Take 40 mg by mouth daily., Disp: , Rfl:  ?  carbidopa-levodopa (SINEMET IR) 25-100 MG tablet, TAKE 1 TABLET 3 TIMES A DAYWITH WATER 30 MINUTES      BEFORE MEALS, Disp: 270 tablet, Rfl: 1 ?  certolizumab pegol (CIMZIA) 2 X 200 MG KIT, See admin instructions., Disp: , Rfl:  ?  Cholecalciferol 50 MCG (2000 UT) TABS, Take 2,000 Units by mouth daily., Disp: , Rfl:  ?  clonazePAM (KLONOPIN) 0.5 MG tablet, At bedtime prn, use 1/2 tab and if needed add second half., Disp: 30 tablet, Rfl: 5 ?  fluticasone (FLONASE) 50 MCG/ACT nasal spray, Place 1 spray into both nostrils daily as needed for rhinitis (drainage issues.). , Disp: , Rfl:  ?  furosemide (LASIX) 20 MG tablet, Take 20 mg by mouth daily., Disp: , Rfl:  ?  KLOR-CON M10 10 MEQ tablet, Take 10 mEq by mouth daily., Disp:  , Rfl:  ?  leflunomide (ARAVA) 20 MG tablet, Take 20 mg by mouth daily., Disp: , Rfl:  ?  losartan (COZAAR) 25 MG tablet, Take 25 mg by mouth daily., Disp: , Rfl:  ?  montelukast (SINGULAIR) 10 MG tablet, Take 10 mg by mouth every evening., Disp: , Rfl:  ?  SYNJARDY XR 09-998 MG TB24, Take 2 tablets by mouth daily. , Disp: , Rfl:  ?  triamterene-hydrochlorothiazide (MAXZIDE-25) 37.5-25 MG tablet, Take 1 tablet by mouth daily. , Disp: , Rfl:  ? ? ? ?Cardiovascular studies: ? ?EKG 08/15/2021: ?Sinus rhythm  ?LBBB ? ?Echocardiogram 09/27/2020:  ?Normal LV systolic function with visual EF 60-65%. Left ventricle cavity  ?is normal in size. Normal global wall motion. Indeterminate diastolic  ?filling pattern, normal LAP. Calculated EF 69%.  ?No significant valvular heart disease.  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 Clinical correlation is  ?required.  ?Compared to study dated 05/04/2018 no significant change.   ? ?Lexiscan Tetrofosmin Stress Test  09/04/2020: ?Nondiagnostic ECG stress. Underlying LBBB. ?Myocardial perfusion is normal with mild diaphragmatic attenuation. ?LV is mildly dilated both in rest and stress images. Overall LV systolic function is abnormal with septal motion consistent with LBBB. Stress LV EF: 34%. ?No previous exam available for comparison. High risk study due to low LVEF. Correlate LVEF with echocardiogram. ? ?CT cardiac scoring 08/14/2020: ?Total score 443 (50-75th percentile) ?LM:  33 ?LAD: 9 ?LCx: 70 ?RCA: 340 ?PDA: 0 ? ?Abdominal Aortic Duplex 04/18/2020:  ?The maximum aorta (sac) diameter is 2.34 cm (mid). There is mild ectasia  ?in the mid abdominal aorta.  ?Moderate diffuse calcific plaque observed throughout the abdominal aorta.  ?Normal flow velocities noted in the aorta and bilateral common iliac  ?arteries.  ?No AAA noted. Consider repeat scan in 5-10 years for stability of ectasia. ? ?Coronary angiography 05/26/2018: ?LM: Normal ?LAD: Normal ?LCx: Normal ?Ramus intermedius:  Normal ?RCA: Mid focal 20% disease ?LVEDP normal ?  ?Conclusion: ?Minimal nonobstructive coronary artery disease ?Normal LVEDP ? ?Cardiac MRI 06/18/2018: ?1. Mild LVE with diffuse hypokinesis and abnormal septal motion EF ?42%  ?2. Mild ventricular non compaction ratio using posterior wall ?thickness 2:1 ?3.  Normal RV size and function ? ? ?Recent labs: ?07/11/2021: ?Chol 155, TG 51, HDL 62, LDL 83 ? ?02/06/2021: ?Chol 154, TG 62, HDL 63, LDL 78 ? ?09/14/2020: ?H/H 13/43. MCV 90. Platelets 175 ?Chol 167, TG 98, HDL 62, LDL 93 ? ? ?Review of Systems  ?Cardiovascular:  Negative for chest pain, dyspnea on exertion, leg swelling, palpitations and syncope.  ? ?   ?Objective:  ? ? ?Vitals:  ? 08/15/21 1025  ?BP: 128/74  ?Pulse: 100  ?Resp: 16  ?Temp: 98 ?F (36.7 ?C)  ?SpO2: 94%  ? ? ? Physical Exam ?Vitals and nursing note reviewed.  ?Constitutional:   ?   General: He is not in acute distress. ?Neck:  ?   Vascular: No JVD.  ?Cardiovascular:  ?   Rate and Rhythm: Normal rate and regular rhythm.  ?   Heart sounds: Normal heart sounds. No murmur heard. ?Pulmonary:  ?   Effort: Pulmonary effort is normal.  ?   Breath sounds: Normal breath sounds. No wheezing or rales.  ?Musculoskeletal:  ?   Right lower leg: No edema.  ?   Left lower leg: No edema.  ? ? ?  ICD-10-CM   ?1. Elevated coronary artery calcium score  R93.1   ?  ?2. Nonischemic cardiomyopathy (HCC)  I42.8   ?  ?3. Mixed hyperlipidemia  E78.2   ?  ?4. Essential hypertension  I10 EKG 12-Lead  ?  ? ? ?   ?Assessment & Recommendations:  ? ? 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 ?Hypertension: ?Increase carvedilol to 6.25 mg bid. ?Take losartan 100 mg at night. ?F/u in 4 weeks to re-assess hypertension ? ?Elevated coronary calcium score: ?Total score of 443, including 33 in left main. ?No anginal symptoms at this  time. ?Stress test with no ischemia (09/2020).  Negative stress LVEF, similar to previous study in 2020. ?Coronary angiogram in 2020 with no significant CAD. ?It is possible that he is developed more coronary calcium deposition since then. ?Continue aspirin 81 mg daily. ?LDL 83, HDL 62 on Lipitor 20 mg (06/2021). ? ?Nonischemic cardiomyopathy: ?Anatomical variant with trabeculations, but does not meet diagnostic criteria for LVNC. EF 42% without any heart failure signs/symptoms. Reasonable not to use anticoagulation.  ? ?Small distal aorta aneurysm: ?Small distal aorta aneurysm. Repeat ultrasound in 2030 ?Continue statin ? ?Type 2 DM: ?Continue current therapy. Managed by PCP. ? ?F/u in 4 weeks ? ?Nigel Mormon, MD ?[facility] ?Pager: [phone] ?Office: [phone] ?If no answer Cell [phone] ?   ?

## 2021-08-20 ENCOUNTER — Ambulatory Visit (INDEPENDENT_AMBULATORY_CARE_PROVIDER_SITE_OTHER): Payer: Medicare Other | Admitting: Pulmonary Disease

## 2021-08-20 VITALS — BP 128/78 | HR 93 | Temp 97.8°F | Ht 70.0 in | Wt 181.0 lb

## 2021-08-20 DIAGNOSIS — Z87891 Personal history of nicotine dependence: Secondary | ICD-10-CM | POA: Diagnosis not present

## 2021-08-20 DIAGNOSIS — R911 Solitary pulmonary nodule: Secondary | ICD-10-CM | POA: Diagnosis not present

## 2021-08-20 DIAGNOSIS — G20 Parkinson's disease: Secondary | ICD-10-CM

## 2021-08-20 DIAGNOSIS — R918 Other nonspecific abnormal finding of lung field: Secondary | ICD-10-CM | POA: Diagnosis not present

## 2021-08-20 NOTE — Patient Instructions (Signed)
Thank you for visiting Dr. Valeta Harms at [facility]. ?Today we recommend the following: ? ?Orders Placed This Encounter  ?Procedures  ? CT Chest Wo Contrast  ? ?Follow up CT chest in 3 months  ? ?Return in about 3 months (around 11/19/2021) for with Eric Form, NP, or Dr. Valeta Harms. ? ? ? ?Please do your part to reduce the spread of COVID-19.  ? ?

## 2021-08-20 NOTE — Progress Notes (Signed)
? ?Synopsis: Referred in April 2023 for abnormal CT chest, groundglass lung nodule by Lahoma Rocker, MD ? ?Subjective:  ? ?PATIENT ID: Tyler Hayes GENDER: male DOB: Mar 05, 1946, MRN: [id number] ? ?Chief Complaint  ?Patient presents with  ? Consult  ?  Scan review, SOB at times   ? ? ?This is a 76 year old gentleman, past medical history of Parkinson's disease, rheumatoid arthritis on leflunomide, history of prostate cancer, hypertension.  Patient is seen today for an abnormal CT scan of the chest.  His CT scan revealed a 1.8 groundglass subsolid nodular area.  He also has other smaller right lower lobe lung nodules.  He does admit to having occasional choking and or strangulation events while eating.  He has a little bit of a mouth tremor and hand tremor.  He was a former smoker quit in 1992 unsure of exactly how long he smoked but was for several years. ? ? ?Past Medical History:  ?Diagnosis Date  ? Arthritis   ? back  ? Cancer [facility])   ? prostate  ? Diabetes mellitus without complication ([place])   ? on meds  ? Diverticulosis   ? Hypercholesteremia   ? Hypertension   ? Meatal stenosis   ? Osteoarthritis   ? Sinus drainage   ? Tinea pedis   ?  ? ?Family History  ?Problem Relation Age of Onset  ? Heart failure Mother   ? CAD Mother   ? CAD Sister   ?     Brain tumor  ? Stroke Sister   ? Cancer Father   ? Stroke Sister   ? Coronary artery disease Sister   ? Hypertension Son   ? Diabetes Son   ? Obesity Son   ? Sleep apnea Son   ? Heart murmur Son   ?     No major structural issues noted.  ?  ? ?Past Surgical History:  ?Procedure Laterality Date  ? BACK SURGERY  1984, 2013  ? lumbar  ? COLONOSCOPY  06/10/2019  ? Dr. Michail Sermon  ? DG DILATION URETERS  2003  ? EYE SURGERY Bilateral 1999  ? Lasik  ? LEFT HEART CATH AND CORONARY ANGIOGRAPHY N/A 05/26/2018  ? Procedure: LEFT HEART CATH AND CORONARY ANGIOGRAPHY;  Surgeon: Nigel Mormon, MD;  Location: [facility];  Service: Cardiovascular;  Laterality: N/A;  ?  POSTERIOR LUMBAR FUSION 4 LEVEL N/A 06/01/2014  ? Procedure: LUMBAR THREE TO FOUR, LUMBAR FOUR TO FIVE LAMINECTOMY,  RIGHT LUMBAR FUSION AT LUMBAR FOUR TO FIVE;  Surgeon: Floyce Stakes, MD;  Location: [facility];  Service: Neurosurgery;  Laterality: N/A;  POSSIBLE L2-3 L3-4 L4-5 L5-S1 POSTERIOR LUMBAR INTERBODY FUSION  ? PROSTATECTOMY  2002  ? ? ?Social History  ? ?Socioeconomic History  ? Marital status: Married  ?  Spouse name: Not on file  ? Number of children: 3  ? Years of education: Not on file  ? Highest education level: Not on file  ?Occupational History  ? Not on file  ?Tobacco Use  ? Smoking status: Former  ?  Packs/day: 1.50  ?  Years: 20.00  ?  Pack years: 30.00  ?  Types: Cigarettes  ?  Quit date: 02/01/1991  ?  Years since quitting: 30.5  ? Smokeless tobacco: Never  ?Vaping Use  ? Vaping Use: Never used  ?Substance and Sexual Activity  ? Alcohol use: Yes  ?  Comment: occasionally  ? Drug use: No  ? Sexual activity: Not on file  ?  Other Topics Concern  ? Not on file  ?Social History Narrative  ? Not on file  ? ?Social Determinants of Health  ? ?Financial Resource Strain: Not on file  ?Food Insecurity: Not on file  ?Transportation Needs: Not on file  ?Physical Activity: Not on file  ?Stress: Not on file  ?Social Connections: Not on file  ?Intimate Partner Violence: Not on file  ?  ? ?Allergies  ?Allergen Reactions  ? Other Anaphylaxis  ? Antihistamines, Diphenhydramine-Type   ?  Other reaction(s): Unknown  ? Ace Inhibitors Cough  ?  LISINOPRIL  ?  ? ?Outpatient Medications Prior to Visit  ?Medication Sig Dispense Refill  ? albuterol (VENTOLIN HFA) 108 (90 Base) MCG/ACT inhaler Inhale into the lungs.    ? aspirin 81 MG chewable tablet 1 tablet    ? atorvastatin (LIPITOR) 40 MG tablet Take 40 mg by mouth daily.    ? carbidopa-levodopa (SINEMET IR) 25-100 MG tablet TAKE 1 TABLET 3 TIMES A DAYWITH WATER 30 MINUTES      BEFORE MEALS 270 tablet 1  ? certolizumab pegol (CIMZIA) 2 X 200 MG KIT See admin  instructions.    ? Cholecalciferol 50 MCG (2000 UT) TABS Take 2,000 Units by mouth daily.    ? clonazePAM (KLONOPIN) 0.5 MG tablet At bedtime prn, use 1/2 tab and if needed add second half. 30 tablet 5  ? fluticasone (FLONASE) 50 MCG/ACT nasal spray Place 1 spray into both nostrils daily as needed for rhinitis (drainage issues.).     ? furosemide (LASIX) 20 MG tablet Take 20 mg by mouth daily.    ? KLOR-CON M10 10 MEQ tablet Take 10 mEq by mouth daily.    ? leflunomide (ARAVA) 20 MG tablet Take 20 mg by mouth daily.    ? losartan (COZAAR) 25 MG tablet Take 25 mg by mouth daily.    ? montelukast (SINGULAIR) 10 MG tablet Take 10 mg by mouth every evening.    ? SYNJARDY XR 09-998 MG TB24 Take 2 tablets by mouth daily.     ? triamterene-hydrochlorothiazide (MAXZIDE-25) 37.5-25 MG tablet Take 1 tablet by mouth daily.     ? ?No facility-administered medications prior to visit.  ? ? ?Review of Systems  ?Constitutional:  Negative for chills, fever, malaise/fatigue and weight loss.  ?HENT:  Negative for hearing loss, sore throat and tinnitus.   ?Eyes:  Negative for blurred vision and double vision.  ?Respiratory:  Negative for cough, hemoptysis, sputum production, shortness of breath, wheezing and stridor.   ?Cardiovascular:  Negative for chest pain, palpitations, orthopnea, leg swelling and PND.  ?Gastrointestinal:  Negative for abdominal pain, constipation, diarrhea, heartburn, nausea and vomiting.  ?Genitourinary:  Negative for dysuria, hematuria and urgency.  ?Musculoskeletal:  Negative for joint pain and myalgias.  ?Skin:  Negative for itching and rash.  ?Neurological:  Positive for tremors and weakness. Negative for dizziness, tingling and headaches.  ?Endo/Heme/Allergies:  Negative for environmental allergies. Does not bruise/bleed easily.  ?Psychiatric/Behavioral:  Negative for depression. The patient is not nervous/anxious and does not have insomnia.   ?All other systems reviewed and are negative. ? ? ?Objective:   ?Physical Exam ?Vitals reviewed.  ?Constitutional:   ?   General: He is not in acute distress. ?   Appearance: He is well-developed.  ?HENT:  ?   Head: Normocephalic and atraumatic.  ?Eyes:  ?   General: No scleral icterus. ?   Conjunctiva/sclera: Conjunctivae normal.  ?   Pupils: Pupils are equal, round, and  reactive to light.  ?Neck:  ?   Vascular: No JVD.  ?   Trachea: No tracheal deviation.  ?Cardiovascular:  ?   Rate and Rhythm: Normal rate and regular rhythm.  ?   Heart sounds: Normal heart sounds. No murmur heard. ?Pulmonary:  ?   Effort: Pulmonary effort is normal. No tachypnea, accessory muscle usage or respiratory distress.  ?   Breath sounds: No stridor. No wheezing, rhonchi or rales.  ?Abdominal:  ?   General: There is no distension.  ?   Palpations: Abdomen is soft.  ?   Tenderness: There is no abdominal tenderness.  ?Musculoskeletal:     ?   General: No tenderness.  ?   Cervical back: Neck supple.  ?Lymphadenopathy:  ?   Cervical: No cervical adenopathy.  ?Skin: ?   General: Skin is warm and dry.  ?   Capillary Refill: Capillary refill takes less than 2 seconds.  ?   Findings: No rash.  ?Neurological:  ?   Mental Status: He is alert and oriented to person, place, and time.  ?   Comments: Facial tremor, mild hand tremor  ?Psychiatric:     ?   Behavior: Behavior normal.  ? ? ? ?Vitals:  ? 08/20/21 1459  ?BP: 128/78  ?Pulse: 93  ?Temp: 97.8 ?F (36.6 ?C)  ?TempSrc: Oral  ?SpO2: 94%  ?Weight: 181 lb (82.1 kg)  ?Height: '5\' 10"'  (1.778 m)  ? ?94% on RA ?BMI Readings from Last 3 Encounters:  ?08/20/21 25.97 kg/m?  ?08/15/21 25.83 kg/m?  ?05/16/21 25.48 kg/m?  ? ?Wt Readings from Last 3 Encounters:  ?08/20/21 181 lb (82.1 kg)  ?08/15/21 180 lb (81.6 kg)  ?05/16/21 177 lb 9.6 oz (80.6 kg)  ? ? ? ?CBC ?   ?Component Value Date/Time  ? WBC 6.9 05/24/2014 1221  ? RBC 4.99 05/24/2014 1221  ? HGB 14.7 05/24/2014 1221  ? HCT 45.5 05/24/2014 1221  ? PLT 207 05/24/2014 1221  ? MCV 91.2 05/24/2014 1221  ? MCH 29.5  05/24/2014 1221  ? MCHC 32.3 05/24/2014 1221  ? RDW 12.1 05/24/2014 1221  ? ? ? ? ?Chest Imaging: ? ?CT chest 08/03/2021: ?Scattered groundglass nodular densities within the lungs.  Suspect small areas of aspiratio

## 2021-09-03 DIAGNOSIS — M0609 Rheumatoid arthritis without rheumatoid factor, multiple sites: Secondary | ICD-10-CM | POA: Diagnosis not present

## 2021-09-04 ENCOUNTER — Encounter: Payer: Self-pay | Admitting: Family Medicine

## 2021-09-04 ENCOUNTER — Ambulatory Visit (INDEPENDENT_AMBULATORY_CARE_PROVIDER_SITE_OTHER): Payer: Medicare Other | Admitting: Family Medicine

## 2021-09-04 VITALS — BP 133/84 | HR 80 | Ht 70.0 in | Wt 176.8 lb

## 2021-09-04 DIAGNOSIS — G20 Parkinson's disease: Secondary | ICD-10-CM | POA: Diagnosis not present

## 2021-09-04 DIAGNOSIS — G3184 Mild cognitive impairment, so stated: Secondary | ICD-10-CM

## 2021-09-04 DIAGNOSIS — E1165 Type 2 diabetes mellitus with hyperglycemia: Secondary | ICD-10-CM | POA: Diagnosis not present

## 2021-09-04 DIAGNOSIS — I10 Essential (primary) hypertension: Secondary | ICD-10-CM | POA: Diagnosis not present

## 2021-09-04 DIAGNOSIS — R9430 Abnormal result of cardiovascular function study, unspecified: Secondary | ICD-10-CM | POA: Diagnosis not present

## 2021-09-04 DIAGNOSIS — G4752 REM sleep behavior disorder: Secondary | ICD-10-CM | POA: Diagnosis not present

## 2021-09-04 DIAGNOSIS — E118 Type 2 diabetes mellitus with unspecified complications: Secondary | ICD-10-CM | POA: Diagnosis not present

## 2021-09-04 DIAGNOSIS — R269 Unspecified abnormalities of gait and mobility: Secondary | ICD-10-CM | POA: Diagnosis not present

## 2021-09-04 NOTE — Patient Instructions (Signed)
Below is our plan: ? ?We will continue carbidopa/levodopa 1 tablet three times daily. Please continue regular physical and mental activity. Let's discuss the concerns of anxiety and potential depression symptoms with Dr Shelia Media. I would recommend considering a mild, low dose antidepressant/antianxiety medication like escitalopram. See what he thinks about this when you talk to him this afternoon.  ? ?Please make sure you are staying well hydrated. I recommend 50-60 ounces daily. Well balanced diet and regular exercise encouraged. Consistent sleep schedule with 6-8 hours recommended.  ? ?Please continue follow up with care team as directed.  ? ?Follow up with Dr Brett Fairy in 4-6 months  ? ?You may receive a survey regarding today's visit. I encourage you to leave honest feed back as I do use this information to improve patient care. Thank you for seeing me today!  ? ? ?Management of Memory Problems ?  ?There are some general things you can do to help manage your memory problems.  Your memory may not in fact recover, but by using techniques and strategies you will be able to manage your memory difficulties better. ?  ?1)  Establish a routine. ?Try to establish and then stick to a regular routine.  By doing this, you will get used to what to expect and you will reduce the need to rely on your memory.  Also, try to do things at the same time of day, such as taking your medication or checking your calendar first thing in the morning. ?Think about think that you can do as a part of a regular routine and make a list.  Then enter them into a daily planner to remind you.  This will help you establish a routine. ?  ?2)  Organize your environment. ?Organize your environment so that it is uncluttered.  Decrease visual stimulation.  Place everyday items such as keys or cell phone in the same place every day (ie.  Basket next to front door) ?Use post it notes with a brief message to yourself (ie. Turn off light, lock the door) ?Use  labels to indicate where things go (ie. Which cupboards are for food, dishes, etc.) ?Keep a notepad and pen by the telephone to take messages ?  ?3)  Memory Aids ?A diary or journal/notebook/daily planner ?Making a list (shopping list, chore list, to do list that needs to be done) ?Using an alarm as a reminder (kitchen timer or cell phone alarm) ?Using cell phone to store information (Notes, Calendar, Reminders) ?Calendar/White board placed in a prominent position ?Post-it notes ?  ?In order for memory aids to be useful, you need to have good habits.  It's no good remembering to make a note in your journal if you don't remember to look in it.  Try setting aside a certain time of day to look in journal. ?  ?4)  Improving mood and managing fatigue. ?There may be other factors that contribute to memory difficulties.  Factors, such as anxiety, depression and tiredness can affect memory. ?Regular gentle exercise can help improve your mood and give you more energy. ?Simple relaxation techniques may help relieve symptoms of anxiety ?Try to get back to completing activities or hobbies you enjoyed doing in the past. ?Learn to pace yourself through activities to decrease fatigue. ?Find out about some local support groups where you can share experiences with others. ?Try and achieve 7-8 hours of sleep at night. ? ? ?

## 2021-09-04 NOTE — Progress Notes (Signed)
? ? ?Chief Complaint  ?Patient presents with  ? Follow-up  ?  RM 1. Last seen 02/28/21.  ? ? ?HISTORY OF PRESENT ILLNESS: ? ?09/04/21 ALL:  ?Tyler Hayes is a 76 y.o. male here today for follow up for Parkinson's Disease. He continues car/levo 1 tablet three times daily, usually around 7am, 12-2pm and 6pm. He does note improvement in tremor and motor function following dosing. He has not taken primidone in about 6 months. He is having some trouble with writing due to hand tremor. He has noted more of a lip tremor, recently. He feels that memory is fairly stable. He does better some days then others. He does have more trouble retaining new information. He is taking clonazepam 1/2 tablet of 0.18m tablet every night. He feels that he is sleeping better. He has occasional restless behaviors and talks in his sleep at times. His wife is sleeping in another room as needed and seems to be resting better. He feels gait is stable. He has to be careful with balance. He denies falls. He goes to [facility] twice weekly for fitness exercises. He is having more difficulty with low back, knees and hip pain. His rheumatologist has started Orencia for RA. He had his first infusion last week. He also mentions some anxiety associated with his health condition. He gets anxious when in public. His wife feels he may be more withdrawn. He has an appt with his PCP this afternoon.  ? ?HISTORY (copied from Dr Dohmeier's previous note) ? ?HPI: 02-28-2021 Tyler Hayes a 76y.o. 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 He is also followed by Dr PVirgina Jock  ?He has a slight vocal tremor, and no dysphagia.  ?  ?He has more tremor. Notably more tremor in his handwriting. Tyler Hayes some inner restlessness, may be some anxiety. No incontinence.  ?  ?Had REM BD for many years. Sleep talking is evident but his spouse has still noted some screaming bumping and kicking.  Has now bed rails, he is not falling out of bed. ?Continues with PAnnice Pihexercise regimen.  ? ?08-29-2020, pleasure of meeting  Tyler Hayes and we are looking at the results of his Montreal cognitive assessment test on his M and S Mini-Mental Status Examination on the MMSE the patient scored 29 out of 30 points on the [facility] he scored 22 and it would be 22 out of 30 points based on impairment of fine motor skills that make it harder for him to write to draw or to connect the dots on the visual spatial executive function test he did well on the Trail making he created a clock face ( Dali-Style)  but the hands of the clock and the numbers were clearly arranged as they should be.   ?He would be able to name 2 of the 3 animals he only recalled 1 out of 5 words but he was fully oriented and his serial sevens abstraction was intact 2.  So I think that this is basically a short-term memory loss and some degree of word finding delay he created after he named 11 animals on the Speed test which is just the border to make a point in this department.  He did very well with repeating a simple sentence but the longer convoluted sentences on the [facility] assessment were harder.  ~I basically think this is short term memory  loss impairment and it would also be to some degree an attention problem. ?  ?He was started on sinemet last visit, does well, no visual hallucinations. He is on primidone. 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  ? ? ?REVIEW OF SYSTEMS: Out of a complete 14 system review of symptoms, the patient complains only of the following symptoms, joint pain, tremor, stiffness, memory loss, anxiety and all other reviewed systems are negative. ? ? ?ALLERGIES: ?Allergies  ?Allergen Reactions  ? Other Anaphylaxis  ? Antihistamines, Diphenhydramine-Type   ?  Other reaction(s): Unknown  ? Ace Inhibitors  Cough  ?  LISINOPRIL  ? ? ? ?HOME MEDICATIONS: ?Outpatient Medications Prior to Visit  ?Medication Sig Dispense Refill  ? Abatacept (ORENCIA IV) Inject into the vein. Gets every 2 weeks for awhile and then goes to monthly infusions    ? albuterol (VENTOLIN HFA) 108 (90 Base) MCG/ACT inhaler Inhale into the lungs.    ? aspirin 81 MG chewable tablet 1 tablet    ? atorvastatin (LIPITOR) 40 MG tablet Take 40 mg by mouth daily.    ? carbidopa-levodopa (SINEMET IR) 25-100 MG tablet TAKE 1 TABLET 3 TIMES A DAYWITH WATER 30 MINUTES      BEFORE MEALS 270 tablet 1  ? Cholecalciferol 50 MCG (2000 UT) TABS Take 2,000 Units by mouth daily.    ? clonazePAM (KLONOPIN) 0.5 MG tablet At bedtime prn, use 1/2 tab and if needed add second half. 30 tablet 5  ? fluticasone (FLONASE) 50 MCG/ACT nasal spray Place 1 spray into both nostrils daily as needed for rhinitis (drainage issues.).     ? furosemide (LASIX) 20 MG tablet Take 20 mg by mouth daily.    ? KLOR-CON M10 10 MEQ tablet Take 10 mEq by mouth daily.    ? leflunomide (ARAVA) 20 MG tablet Take 20 mg by mouth daily.    ? losartan (COZAAR) 25 MG tablet Take 100 mg by mouth at bedtime.    ? montelukast (SINGULAIR) 10 MG tablet Take 10 mg by mouth every evening.    ? SYNJARDY XR 09-998 MG TB24 Take 2 tablets by mouth daily.     ? certolizumab pegol (CIMZIA) 2 X 200 MG KIT See admin instructions.    ? triamterene-hydrochlorothiazide (MAXZIDE-25) 37.5-25 MG tablet Take 1 tablet by mouth daily.     ? ?No facility-administered medications prior to visit.  ? ? ? ?PAST MEDICAL HISTORY: ?Past Medical History:  ?Diagnosis Date  ? Arthritis   ? back  ? Cancer [facility])   ? prostate  ? Diabetes mellitus without complication ([place])   ? on meds  ? Diverticulosis   ? Hypercholesteremia   ? Hypertension   ? Meatal stenosis   ? Osteoarthritis   ? Sinus drainage   ? Tinea pedis   ? ? ? ?PAST SURGICAL HISTORY: ?Past Surgical History:  ?Procedure Laterality Date  ? BACK SURGERY  1984, 2013  ? lumbar  ?  COLONOSCOPY  06/10/2019  ? Dr. Michail Sermon  ? DG DILATION URETERS  2003  ? EYE SURGERY Bilateral 1999  ? Lasik  ? LEFT HEART CATH AND CORONARY ANGIOGRAPHY N/A 05/26/2018  ? Procedure: LEFT HEART CATH AND CORONARY ANGIOGRAPHY;  Surgeon: Nigel Mormon, MD;  Location: [place] [facility];  Service: Cardiovascular;  Laterality: N/A;  ? POSTERIOR LUMBAR FUSION 4 LEVEL N/A 06/01/2014  ? Procedure: LUMBAR THREE TO FOUR, LUMBAR  FOUR TO FIVE LAMINECTOMY,  RIGHT LUMBAR FUSION AT LUMBAR FOUR TO FIVE;  Surgeon: Floyce Stakes, MD;  Location: [place] [facility];  Service: Neurosurgery;  Laterality: N/A;  POSSIBLE L2-3 L3-4 L4-5 L5-S1 POSTERIOR LUMBAR INTERBODY FUSION  ? PROSTATECTOMY  2002  ? ? ? ?FAMILY HISTORY: ?Family History  ?Problem Relation Age of Onset  ? Heart failure Mother   ? CAD Mother   ? CAD Sister   ?     Brain tumor  ? Stroke Sister   ? Cancer Father   ? Stroke Sister   ? Coronary artery disease Sister   ? Hypertension Son   ? Diabetes Son   ? Obesity Son   ? Sleep apnea Son   ? Heart murmur Son   ?     No major structural issues noted.  ? ? ? ?SOCIAL HISTORY: ?Social History  ? ?Socioeconomic History  ? Marital status: Married  ?  Spouse name: Not on file  ? Number of children: 3  ? Years of education: Not on file  ? Highest education level: Not on file  ?Occupational History  ? Not on file  ?Tobacco Use  ? Smoking status: Former  ?  Packs/day: 1.50  ?  Years: 20.00  ?  Pack years: 30.00  ?  Types: Cigarettes  ?  Quit date: 02/01/1991  ?  Years since quitting: 30.6  ? Smokeless tobacco: Never  ?Vaping Use  ? Vaping Use: Never used  ?Substance and Sexual Activity  ? Alcohol use: Yes  ?  Comment: occasionally  ? Drug use: No  ? Sexual activity: Not on file  ?Other Topics Concern  ? Not on file  ?Social History Narrative  ? Not on file  ? ?Social Determinants of Health  ? ?Financial Resource Strain: Not on file  ?Food Insecurity: Not on file  ?Transportation Needs: Not on file  ?Physical Activity: Not on file  ?Stress:  Not on file  ?Social Connections: Not on file  ?Intimate Partner Violence: Not on file  ? ? ? ?PHYSICAL EXAM ? ?Vitals:  ? 09/04/21 1016  ?BP: 133/84  ?Pulse: 80  ?Weight: 176 lb 12.8 oz (80.2 kg)  ?[place]

## 2021-09-13 ENCOUNTER — Encounter: Payer: Self-pay | Admitting: Cardiology

## 2021-09-13 ENCOUNTER — Ambulatory Visit: Payer: Medicare Other | Admitting: Cardiology

## 2021-09-13 VITALS — BP 134/83 | HR 89 | Temp 98.0°F | Resp 16 | Ht 70.0 in | Wt 176.0 lb

## 2021-09-13 DIAGNOSIS — R931 Abnormal findings on diagnostic imaging of heart and coronary circulation: Secondary | ICD-10-CM | POA: Diagnosis not present

## 2021-09-13 DIAGNOSIS — E782 Mixed hyperlipidemia: Secondary | ICD-10-CM | POA: Diagnosis not present

## 2021-09-13 DIAGNOSIS — I428 Other cardiomyopathies: Secondary | ICD-10-CM

## 2021-09-13 DIAGNOSIS — I10 Essential (primary) hypertension: Secondary | ICD-10-CM | POA: Diagnosis not present

## 2021-09-13 MED ORDER — CARVEDILOL 6.25 MG PO TABS: 6.2500 mg | ORAL_TABLET | Freq: Two times a day (BID) | ORAL | 3 refills | Status: DC

## 2021-09-13 MED ORDER — FUROSEMIDE 20 MG PO TABS: 20.0000 mg | ORAL_TABLET | ORAL | 2 refills | Status: DC | PRN

## 2021-09-13 NOTE — Progress Notes (Signed)
? ? ? ?Patient is here for follow up visit. ? ?Subjective:  ? ?Tyler Hayes, male    DOB: February 20, 1946, 76 y.o.   MRN: [id number] ? ? ?Chief Complaint  ?Patient presents with  ? Hypertension  ? Follow-up  ?  4 week  ? ? ? ?HPI ? ? 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 ?Since last visit, patient has noted significant improvement in blood pressure readings. He is also feeling more energetic.Leg edema has resolved. ? ? ?Current Outpatient Medications:  ?  Abatacept (ORENCIA IV), Inject into the vein. Gets every 2 weeks for awhile and then goes to monthly infusions, Disp: , Rfl:  ?  albuterol (VENTOLIN HFA) 108 (90 Base) MCG/ACT inhaler, Inhale into the lungs., Disp: , Rfl:  ?  aspirin 81 MG chewable tablet, 1 tablet, Disp: , Rfl:  ?  atorvastatin (LIPITOR) 40 MG tablet, Take 40 mg by mouth daily., Disp: , Rfl:  ?  carbidopa-levodopa (SINEMET IR) 25-100 MG tablet, TAKE 1 TABLET 3 TIMES A DAYWITH WATER 30 MINUTES      BEFORE MEALS, Disp: 270 tablet, Rfl: 1 ?  Cholecalciferol 50 MCG (2000 UT) TABS, Take 2,000 Units by mouth daily., Disp: , Rfl:  ?  clonazePAM (KLONOPIN) 0.5 MG tablet, At bedtime prn, use 1/2 tab and if needed add second half., Disp: 30 tablet, Rfl: 5 ?  fluticasone (FLONASE) 50 MCG/ACT nasal spray, Place 1 spray into both nostrils daily as needed for rhinitis (drainage issues.). , Disp: , Rfl:  ?  furosemide (LASIX) 20 MG tablet, Take 20 mg by mouth daily., Disp: , Rfl:  ?  KLOR-CON M10 10 MEQ tablet, Take 10 mEq by mouth daily., Disp: , Rfl:  ?  leflunomide (ARAVA) 20 MG tablet, Take 20 mg by mouth daily., Disp: , Rfl:  ?  losartan (COZAAR) 25 MG tablet, Take 100 mg by mouth at bedtime., Disp: , Rfl:  ?  montelukast (SINGULAIR) 10 MG tablet, Take 10 mg by mouth every evening., Disp: , Rfl:  ?  SYNJARDY XR 09-998 MG TB24, Take 2 tablets by  mouth daily. , Disp: , Rfl:  ? ? ? ?Cardiovascular studies: ? ?EKG 08/15/2021: ?Sinus rhythm  ?LBBB ? ?Echocardiogram 09/27/2020:  ?Normal LV systolic function with visual EF 60-65%. Left ventricle cavity  ?is normal in size. Normal global wall motion. Indeterminate diastolic  ?filling pattern, normal LAP. Calculated EF 69%.  ?No significant valvular heart disease.  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 Clinical correlation is  ?required.  ?Compared to study dated 05/04/2018 no significant change.   ? ?Lexiscan Tetrofosmin Stress Test  09/04/2020: ?Nondiagnostic ECG stress. Underlying LBBB. ?Myocardial perfusion is normal with mild diaphragmatic attenuation. ?LV is mildly dilated both in rest and stress images. Overall LV systolic function is abnormal with septal motion consistent with LBBB. Stress LV EF: 34%. ?No previous exam available for comparison. High risk study due to low LVEF. Correlate LVEF with echocardiogram. ? ?CT cardiac scoring 08/14/2020: ?Total score 443 (50-75th percentile) ?LM: 33 ?LAD: 9 ?LCx: 70 ?RCA: 340 ?PDA: 0 ? ?Abdominal Aortic Duplex 04/18/2020:  ?The maximum aorta (sac) diameter is 2.34 cm (mid). There is mild ectasia  ?in the mid abdominal aorta.  ?Moderate diffuse calcific plaque observed throughout the abdominal aorta.  ?Normal flow velocities noted in the aorta and bilateral common iliac  ?arteries.  ?No AAA noted. Consider repeat scan in 5-10 years  for stability of ectasia. ? ?Coronary angiography 05/26/2018: ?LM: Normal ?LAD: Normal ?LCx: Normal ?Ramus intermedius: Normal ?RCA: Mid focal 20% disease ?LVEDP normal ?  ?Conclusion: ?Minimal nonobstructive coronary artery disease ?Normal LVEDP ? ?Cardiac MRI 06/18/2018: ?1. Mild LVE with diffuse hypokinesis and abnormal septal motion EF ?42%  ?2. Mild ventricular non compaction ratio using posterior wall ?thickness 2:1 ?3.  Normal RV size and function ? ? ?Recent labs: ?07/11/2021: ?Chol 155, TG 51, HDL 62, LDL  83 ? ?02/06/2021: ?Chol 154, TG 62, HDL 63, LDL 78 ? ?09/14/2020: ?H/H 13/43. MCV 90. Platelets 175 ?Chol 167, TG 98, HDL 62, LDL 93 ? ? ?Review of Systems  ?Cardiovascular:  Negative for chest pain, dyspnea on exertion, leg swelling, palpitations and syncope.  ? ?   ?Objective:  ? ? ?Vitals:  ? 09/13/21 1005  ?BP: 134/83  ?Pulse: 89  ?Resp: 16  ?Temp: 98 ?F (36.7 ?C)  ?SpO2: 96%  ? ? ? Physical Exam ?Vitals and nursing note reviewed.  ?Constitutional:   ?   General: He is not in acute distress. ?Neck:  ?   Vascular: No JVD.  ?Cardiovascular:  ?   Rate and Rhythm: Normal rate and regular rhythm.  ?   Heart sounds: Normal heart sounds. No murmur heard. ?Pulmonary:  ?   Effort: Pulmonary effort is normal.  ?   Breath sounds: Normal breath sounds. No wheezing or rales.  ?Musculoskeletal:  ?   Right lower leg: No edema.  ?   Left lower leg: No edema.  ? ? ?  ICD-10-CM   ?1. Essential hypertension  I10   ?  ?2. Elevated coronary artery calcium score  R93.1   ?  ?3. Mixed hyperlipidemia  E78.2   ?  ?4. Nonischemic cardiomyopathy (HCC)  I42.8   ?  ? ?Meds ordered this encounter  ?Medications  ? carvedilol (COREG) 6.25 MG tablet  ?  Sig: Take 1 tablet (6.25 mg total) by mouth 2 (two) times daily.  ?  Dispense:  180 tablet  ?  Refill:  3  ? furosemide (LASIX) 20 MG tablet  ?  Sig: Take 1 tablet (20 mg total) by mouth as needed.  ?  Dispense:  60 tablet  ?  Refill:  2  ? ? ?   ?Assessment & Recommendations:  ? ? 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 ?Hypertension: ?Now well controlled on carvedilol to 6.25 mg bid, losartan 100 mg at night. ?Okay to take lasix only as needed. ? ?Elevated coronary calcium score: ?Total score of 443, including 33 in left main. ?No anginal symptoms at this time. ?Stress test with no ischemia (09/2020).  Negative stress LVEF, similar to  previous study in 2020. ?Coronary angiogram in 2020 with no significant CAD. ?It is possible that he is developed more coronary calcium deposition since then. ?Continue aspirin 81 mg daily. ?LDL 83, HDL 62 on Lipitor 20 mg (06/2021). ? ?Nonischemic cardiomyopathy: ?Anatomical variant with trabeculations, but does not meet diagnostic criteria for LVNC. EF 42% without any heart failure signs/symptoms. Reasonable not to use anticoagulation.  ? ?Small distal aorta aneurysm: ?Small distal aorta aneurysm. Repeat ultrasound in 2030 ?Continue statin ? ?Type 2 DM: ?Continue current therapy. Managed by PCP. ? ?F/u in 6 months ? ?Nigel Mormon, MD ?[facility] ?Pager: [phone] ?Office: [phone] ?If no answer Cell [phone] ?   ?

## 2021-09-14 ENCOUNTER — Other Ambulatory Visit: Payer: Self-pay | Admitting: Cardiology

## 2021-09-14 ENCOUNTER — Encounter: Payer: Self-pay | Admitting: Cardiology

## 2021-09-17 ENCOUNTER — Ambulatory Visit
Admission: RE | Admit: 2021-09-17 | Discharge: 2021-09-17 | Disposition: A | Discharge status: Home / Self Care | Payer: Medicare Other | Source: Ambulatory Visit | Attending: Pulmonary Disease | Admitting: Pulmonary Disease

## 2021-09-17 DIAGNOSIS — R911 Solitary pulmonary nodule: Secondary | ICD-10-CM

## 2021-09-17 DIAGNOSIS — M0609 Rheumatoid arthritis without rheumatoid factor, multiple sites: Secondary | ICD-10-CM | POA: Diagnosis not present

## 2021-09-18 ENCOUNTER — Other Ambulatory Visit: Payer: Self-pay | Admitting: Internal Medicine

## 2021-09-18 ENCOUNTER — Telehealth: Payer: Self-pay

## 2021-09-18 ENCOUNTER — Other Ambulatory Visit: Payer: Self-pay

## 2021-09-18 ENCOUNTER — Other Ambulatory Visit: Payer: Self-pay | Admitting: Pulmonary Disease

## 2021-09-18 DIAGNOSIS — R911 Solitary pulmonary nodule: Secondary | ICD-10-CM

## 2021-09-18 NOTE — Telephone Encounter (Signed)
Use only as needed for leg edema. No need to take it daily. ? ?Thanks ?MJP ? ?

## 2021-09-18 NOTE — Telephone Encounter (Signed)
It was supposed to be up from 3.125 mg bid to 6.25 mg bid. Unless blood pressure is low (SBP <110 mmHg), I would recommend taking 6.25 mg bid.  ? ?Thanks ?MJP ? ?

## 2021-09-18 NOTE — Telephone Encounter (Signed)
Patient is aware to take Carvedilol BID, but patient is now questioning the 6.'25mg'$  BID, because he said that all this time he has been taking 3.'125mg'$  BID and patient says that you said at the last office visit, that you were upping the dose so he could take it once a day, instead of breaking it up, but not to take the 6.'25mg'$  twice a day. Please clarify.

## 2021-09-18 NOTE — Telephone Encounter (Signed)
Patient called and asked about his carvedilol 6.25 BID. He stated that he does not remember you telling him to take it BID.I explained to him , that in the notes, this is correct, but he wanted me to ask you for re-confirmation. Please advise.  ? ? ?

## 2021-09-18 NOTE — Telephone Encounter (Signed)
I know I asked a question about the Furosemide (as needed for leg edema) when the pharmacy called, but this is in regards to Carvedilol. Please clarify.

## 2021-09-18 NOTE — Telephone Encounter (Signed)
Sorry. Yes, carvedilol is a twice a day drug. ? ?Thanks ?MJP ? ?

## 2021-09-20 NOTE — Telephone Encounter (Signed)
Called pt to inform him about the message above. Pt understood

## 2021-09-21 DIAGNOSIS — Z23 Encounter for immunization: Secondary | ICD-10-CM | POA: Diagnosis not present

## 2021-10-01 DIAGNOSIS — M0609 Rheumatoid arthritis without rheumatoid factor, multiple sites: Secondary | ICD-10-CM | POA: Diagnosis not present

## 2021-10-02 DIAGNOSIS — E7800 Pure hypercholesterolemia, unspecified: Secondary | ICD-10-CM | POA: Diagnosis not present

## 2021-10-02 DIAGNOSIS — I428 Other cardiomyopathies: Secondary | ICD-10-CM | POA: Diagnosis not present

## 2021-10-02 DIAGNOSIS — I10 Essential (primary) hypertension: Secondary | ICD-10-CM | POA: Diagnosis not present

## 2021-10-02 DIAGNOSIS — E118 Type 2 diabetes mellitus with unspecified complications: Secondary | ICD-10-CM | POA: Diagnosis not present

## 2021-11-05 DIAGNOSIS — M0609 Rheumatoid arthritis without rheumatoid factor, multiple sites: Secondary | ICD-10-CM | POA: Diagnosis not present

## 2021-11-07 ENCOUNTER — Other Ambulatory Visit: Payer: Self-pay

## 2021-11-07 DIAGNOSIS — I2510 Atherosclerotic heart disease of native coronary artery without angina pectoris: Secondary | ICD-10-CM | POA: Diagnosis not present

## 2021-11-07 DIAGNOSIS — R931 Abnormal findings on diagnostic imaging of heart and coronary circulation: Secondary | ICD-10-CM

## 2021-11-08 LAB — LIPID PANEL WITH LDL/HDL RATIO
Cholesterol, Total: 148 mg/dL (ref 100–199)
HDL: 54 mg/dL (ref >39)
LDL Chol Calc (NIH): 82 mg/dL (ref 0–99)
LDL/HDL Ratio: 1.5 ratio (ref 0.0–3.6)
Triglycerides: 55 mg/dL (ref 0–149)
VLDL Cholesterol Cal: 12 mg/dL (ref 5–40)

## 2021-11-08 LAB — BASIC METABOLIC PANEL
BUN/Creatinine Ratio: 13 (ref 10–24)
BUN: 14 mg/dL (ref 8–27)
CO2: 24 mmol/L (ref 20–29)
Calcium: 10.1 mg/dL (ref 8.6–10.2)
Chloride: 106 mmol/L (ref 96–106)
Creatinine, Ser: 1.1 mg/dL (ref 0.76–1.27)
Glucose: 101 mg/dL — ABNORMAL HIGH (ref 70–99)
Potassium: 4 mmol/L (ref 3.5–5.2)
Sodium: 144 mmol/L (ref 134–144)
eGFR: 70 mL/min/{1.73_m2} (ref >59)

## 2021-11-26 ENCOUNTER — Ambulatory Visit: Payer: Medicare Other | Admitting: Pulmonary Disease

## 2021-11-29 ENCOUNTER — Other Ambulatory Visit: Payer: Medicare Other

## 2021-12-05 DIAGNOSIS — M0609 Rheumatoid arthritis without rheumatoid factor, multiple sites: Secondary | ICD-10-CM | POA: Diagnosis not present

## 2021-12-07 ENCOUNTER — Ambulatory Visit
Admission: RE | Admit: 2021-12-07 | Discharge: 2021-12-07 | Disposition: A | Discharge status: Home / Self Care | Payer: Medicare Other | Source: Ambulatory Visit | Attending: Pulmonary Disease | Admitting: Pulmonary Disease

## 2021-12-07 DIAGNOSIS — R911 Solitary pulmonary nodule: Secondary | ICD-10-CM | POA: Diagnosis not present

## 2021-12-13 ENCOUNTER — Encounter: Payer: Self-pay | Admitting: Pulmonary Disease

## 2021-12-13 ENCOUNTER — Ambulatory Visit (INDEPENDENT_AMBULATORY_CARE_PROVIDER_SITE_OTHER): Payer: Medicare Other | Admitting: Pulmonary Disease

## 2021-12-13 VITALS — BP 120/72 | HR 66 | Temp 97.5°F | Ht 70.0 in | Wt 177.0 lb

## 2021-12-13 DIAGNOSIS — Z87891 Personal history of nicotine dependence: Secondary | ICD-10-CM | POA: Diagnosis not present

## 2021-12-13 DIAGNOSIS — R911 Solitary pulmonary nodule: Secondary | ICD-10-CM | POA: Diagnosis not present

## 2021-12-13 DIAGNOSIS — R918 Other nonspecific abnormal finding of lung field: Secondary | ICD-10-CM | POA: Diagnosis not present

## 2021-12-13 DIAGNOSIS — G20 Parkinson's disease: Secondary | ICD-10-CM

## 2021-12-13 NOTE — Progress Notes (Signed)
Synopsis: Referred in April 2023 for abnormal CT chest, groundglass lung nodule by Deland Pretty, MD  Subjective:   PATIENT ID: Tyler Hayes GENDER: male DOB: 1945/11/17, MRN: [id number]  Chief Complaint  Patient presents with   Follow-up    He is doing well and has no breathing concerns,.     This is a 76 year old gentleman, past medical history of Parkinson's disease, rheumatoid arthritis on leflunomide, history of prostate cancer, hypertension.  Patient is seen today for an abnormal CT scan of the chest.  His CT scan revealed a 1.8 groundglass subsolid nodular area.  He also has other smaller right lower lobe lung nodules.  He does admit to having occasional choking and or strangulation events while eating.  He has a little bit of a mouth tremor and hand tremor.  He was a former smoker quit in 1992 unsure of exactly how long he smoked but was for several years.  OV 12/13/2021: Here today for follow-up after recent CT scan of the chest.  Patient has small groundglass nodule that looks stable on recent CT imaging.  We reviewed this today in the office.  Respiratory standpoint has no trouble.  No complaints today.  Wife present today in the office visit.    Past Medical History:  Diagnosis Date   Arthritis    back   Cancer ([place])    prostate   Diabetes mellitus without complication ([place])    on meds   Diverticulosis    Hypercholesteremia    Hypertension    Meatal stenosis    Osteoarthritis    Sinus drainage    Tinea pedis      Family History  Problem Relation Age of Onset   Heart failure Mother    CAD Mother    CAD Sister        Brain tumor   Stroke Sister    Cancer Father    Stroke Sister    Coronary artery disease Sister    Hypertension Son    Diabetes Son    Obesity Son    Sleep apnea Son    Heart murmur Son        No major structural issues noted.     Past Surgical History:  Procedure Laterality Date   BACK SURGERY  1984, 2013   lumbar   COLONOSCOPY   06/10/2019   Dr. Michail Sermon   DG DILATION URETERS  2003   EYE SURGERY Bilateral 1999   Lasik   LEFT HEART CATH AND CORONARY ANGIOGRAPHY N/A 05/26/2018   Procedure: LEFT HEART CATH AND CORONARY ANGIOGRAPHY;  Surgeon: Nigel Mormon, MD;  Location: [facility];  Service: Cardiovascular;  Laterality: N/A;   POSTERIOR LUMBAR FUSION 4 LEVEL N/A 06/01/2014   Procedure: LUMBAR THREE TO FOUR, LUMBAR FOUR TO FIVE LAMINECTOMY,  RIGHT LUMBAR FUSION AT LUMBAR FOUR TO FIVE;  Surgeon: Floyce Stakes, MD;  Location: [facility];  Service: Neurosurgery;  Laterality: N/A;  POSSIBLE L2-3 L3-4 L4-5 L5-S1 POSTERIOR LUMBAR INTERBODY FUSION   PROSTATECTOMY  2002    Social History   Socioeconomic History   Marital status: Married    Spouse name: Not on file   Number of children: 3   Years of education: Not on file   Highest education level: Not on file  Occupational History   Not on file  Tobacco Use   Smoking status: Former    Packs/day: 1.50    Years: 20.00    Total pack years: 30.00  Types: Cigarettes    Quit date: 02/01/1991    Years since quitting: 30.8   Smokeless tobacco: Never  Vaping Use   Vaping Use: Never used  Substance and Sexual Activity   Alcohol use: Yes    Comment: occasionally   Drug use: No   Sexual activity: Not on file  Other Topics Concern   Not on file  Social History Narrative   Not on file   Social Determinants of Health   Financial Resource Strain: Not on file  Food Insecurity: Not on file  Transportation Needs: Not on file  Physical Activity: Not on file  Stress: Not on file  Social Connections: Not on file  Intimate Partner Violence: Not on file     Allergies  Allergen Reactions   Other Anaphylaxis   Ace Inhibitors Cough    LISINOPRIL   Antihistamines, Diphenhydramine-Type Other (See Comments)    Other reaction(s): Unknown     Outpatient Medications Prior to Visit  Medication Sig Dispense Refill   Abatacept (ORENCIA IV) Inject into  the vein. Gets every 4 weeks for awhile and then goes to monthly infusions     albuterol (VENTOLIN HFA) 108 (90 Base) MCG/ACT inhaler Inhale into the lungs.     aspirin 81 MG chewable tablet 1 tablet     atorvastatin (LIPITOR) 40 MG tablet Take 40 mg by mouth daily.     carbidopa-levodopa (SINEMET IR) 25-100 MG tablet TAKE 1 TABLET 3 TIMES A DAYWITH WATER 30 MINUTES      BEFORE MEALS 270 tablet 1   carvedilol (COREG) 6.25 MG tablet Take 1 tablet (6.25 mg total) by mouth 2 (two) times daily. 180 tablet 3   Cholecalciferol 50 MCG (2000 UT) TABS Take 2,000 Units by mouth daily.     clonazePAM (KLONOPIN) 0.5 MG tablet At bedtime prn, use 1/2 tab and if needed add second half. 30 tablet 5   fluticasone (FLONASE) 50 MCG/ACT nasal spray Place 1 spray into both nostrils daily as needed for rhinitis (drainage issues.).      furosemide (LASIX) 20 MG tablet TAKE 1 TABLET (20 MG TOTAL) BY MOUTH AS NEEDED. 60 tablet 2   KLOR-CON M10 10 MEQ tablet Take 10 mEq by mouth daily.     leflunomide (ARAVA) 20 MG tablet Take 20 mg by mouth daily.     losartan (COZAAR) 25 MG tablet Take 100 mg by mouth at bedtime.     montelukast (SINGULAIR) 10 MG tablet Take 10 mg by mouth every evening.     SYNJARDY XR 09-998 MG TB24 Take 2 tablets by mouth daily.      No facility-administered medications prior to visit.    Review of Systems  Constitutional:  Negative for chills, fever, malaise/fatigue and weight loss.  HENT:  Negative for hearing loss, sore throat and tinnitus.   Eyes:  Negative for blurred vision and double vision.  Respiratory:  Negative for cough, hemoptysis, sputum production, shortness of breath, wheezing and stridor.   Cardiovascular:  Negative for chest pain, palpitations, orthopnea, leg swelling and PND.  Gastrointestinal:  Negative for abdominal pain, constipation, diarrhea, heartburn, nausea and vomiting.  Genitourinary:  Negative for dysuria, hematuria and urgency.  Musculoskeletal:  Negative for  joint pain and myalgias.  Skin:  Negative for itching and rash.  Neurological:  Positive for tremors and weakness. Negative for dizziness, tingling and headaches.  Endo/Heme/Allergies:  Negative for environmental allergies. Does not bruise/bleed easily.  Psychiatric/Behavioral:  Negative for depression. The patient is not nervous/anxious  and does not have insomnia.   All other systems reviewed and are negative.    Objective:  Physical Exam Vitals reviewed.  Constitutional:      General: He is not in acute distress.    Appearance: He is well-developed.  HENT:     Head: Normocephalic and atraumatic.  Eyes:     General: No scleral icterus.    Conjunctiva/sclera: Conjunctivae normal.     Pupils: Pupils are equal, round, and reactive to light.  Neck:     Vascular: No JVD.     Trachea: No tracheal deviation.  Cardiovascular:     Rate and Rhythm: Normal rate and regular rhythm.     Heart sounds: Normal heart sounds. No murmur heard. Pulmonary:     Effort: Pulmonary effort is normal. No tachypnea, accessory muscle usage or respiratory distress.     Breath sounds: No stridor. No wheezing, rhonchi or rales.  Abdominal:     General: There is no distension.     Palpations: Abdomen is soft.     Tenderness: There is no abdominal tenderness.  Musculoskeletal:        General: No tenderness.     Cervical back: Neck supple.  Lymphadenopathy:     Cervical: No cervical adenopathy.  Skin:    General: Skin is warm and dry.     Capillary Refill: Capillary refill takes less than 2 seconds.     Findings: No rash.  Neurological:     Mental Status: He is alert and oriented to person, place, and time.  Psychiatric:        Behavior: Behavior normal.      Vitals:   12/13/21 0912  BP: 120/72  Pulse: 66  Temp: (!) 97.5 F (36.4 C)  TempSrc: Oral  SpO2: 98%  Weight: 177 lb (80.3 kg)  Height: '5\' 10"'$  (1.778 m)   98% on RA BMI Readings from Last 3 Encounters:  12/13/21 25.40 kg/m   09/13/21 25.25 kg/m  09/04/21 25.37 kg/m   Wt Readings from Last 3 Encounters:  12/13/21 177 lb (80.3 kg)  09/13/21 176 lb (79.8 kg)  09/04/21 176 lb 12.8 oz (80.2 kg)     CBC    Component Value Date/Time   WBC 6.9 05/24/2014 1221   RBC 4.99 05/24/2014 1221   HGB 14.7 05/24/2014 1221   HCT 45.5 05/24/2014 1221   PLT 207 05/24/2014 1221   MCV 91.2 05/24/2014 1221   MCH 29.5 05/24/2014 1221   MCHC 32.3 05/24/2014 1221   RDW 12.1 05/24/2014 1221    Chest Imaging:  CT chest 08/03/2021: Scattered groundglass nodular densities within the lungs.  Suspect small areas of aspiration.  Also has a larger density that corresponds in the superior right middle lobe at approximately 1.8 cm. The patient's images have been independently reviewed by me.    CT chest 12/07/2021: Stable groundglass subsolid lesion within the right lung. The patient's images have been independently reviewed by me.      Pulmonary Functions Testing Results:     No data to display         FeNO:   Pathology:   Echocardiogram:   Heart Catheterization:     Assessment & Plan:     ICD-10-CM   1. Lung nodule  R91.1 CT Chest Wo Contrast    2. Ground glass opacity present on imaging of lung  R91.8 CT Chest Wo Contrast    3. Primary parkinsonism ([place])  G20     4. Former smoker  A91.916       Discussion:  This is a 76 year old gentleman, right lung nodule, groundglass opacity, history of primary Parkinson's disease, former smoker.  Little tiny area within the lung that appears stable.  It does appear that is on a fissure line.  Looks more like a plaque or inflammatory lesion in nature versus a malignancy  Recent CT imaging document stability at 3 months.  Plan: We will plan for repeat noncontrasted CT scan of the chest in 12 months. With his other comorbidities such as Parkinson's and rheumatoid arthritis I think is reasonable to have a conservative surveillance imaging on a groundglass  lesion. Return to clinic in 12 months after CT chest complete to see me or SG, NP.   Current Outpatient Medications:    Abatacept (ORENCIA IV), Inject into the vein. Gets every 4 weeks for awhile and then goes to monthly infusions, Disp: , Rfl:    albuterol (VENTOLIN HFA) 108 (90 Base) MCG/ACT inhaler, Inhale into the lungs., Disp: , Rfl:    aspirin 81 MG chewable tablet, 1 tablet, Disp: , Rfl:    atorvastatin (LIPITOR) 40 MG tablet, Take 40 mg by mouth daily., Disp: , Rfl:    carbidopa-levodopa (SINEMET IR) 25-100 MG tablet, TAKE 1 TABLET 3 TIMES A DAYWITH WATER 30 MINUTES      BEFORE MEALS, Disp: 270 tablet, Rfl: 1   carvedilol (COREG) 6.25 MG tablet, Take 1 tablet (6.25 mg total) by mouth 2 (two) times daily., Disp: 180 tablet, Rfl: 3   Cholecalciferol 50 MCG (2000 UT) TABS, Take 2,000 Units by mouth daily., Disp: , Rfl:    clonazePAM (KLONOPIN) 0.5 MG tablet, At bedtime prn, use 1/2 tab and if needed add second half., Disp: 30 tablet, Rfl: 5   fluticasone (FLONASE) 50 MCG/ACT nasal spray, Place 1 spray into both nostrils daily as needed for rhinitis (drainage issues.). , Disp: , Rfl:    furosemide (LASIX) 20 MG tablet, TAKE 1 TABLET (20 MG TOTAL) BY MOUTH AS NEEDED., Disp: 60 tablet, Rfl: 2   KLOR-CON M10 10 MEQ tablet, Take 10 mEq by mouth daily., Disp: , Rfl:    leflunomide (ARAVA) 20 MG tablet, Take 20 mg by mouth daily., Disp: , Rfl:    losartan (COZAAR) 25 MG tablet, Take 100 mg by mouth at bedtime., Disp: , Rfl:    montelukast (SINGULAIR) 10 MG tablet, Take 10 mg by mouth every evening., Disp: , Rfl:    SYNJARDY XR 09-998 MG TB24, Take 2 tablets by mouth daily. , Disp: , Rfl:    Garner Nash, DO  Pulmonary Critical Care 12/13/2021 9:34 AM

## 2021-12-13 NOTE — Patient Instructions (Addendum)
Thank you for visiting Dr. Valeta Harms at [facility]. Today we recommend the following:  Orders Placed This Encounter  Procedures   CT Chest Wo Contrast   See [place] in 1 year after CT Chest   Return in about 1 year (around 12/14/2022) for with Eric Form, NP, or Dr. Valeta Harms.    Please do your part to reduce the spread of COVID-19.

## 2021-12-31 DIAGNOSIS — M0609 Rheumatoid arthritis without rheumatoid factor, multiple sites: Secondary | ICD-10-CM | POA: Diagnosis not present

## 2021-12-31 DIAGNOSIS — R768 Other specified abnormal immunological findings in serum: Secondary | ICD-10-CM | POA: Diagnosis not present

## 2021-12-31 DIAGNOSIS — M79644 Pain in right finger(s): Secondary | ICD-10-CM | POA: Diagnosis not present

## 2021-12-31 DIAGNOSIS — M8580 Other specified disorders of bone density and structure, unspecified site: Secondary | ICD-10-CM | POA: Diagnosis not present

## 2021-12-31 DIAGNOSIS — M25569 Pain in unspecified knee: Secondary | ICD-10-CM | POA: Diagnosis not present

## 2021-12-31 DIAGNOSIS — M2550 Pain in unspecified joint: Secondary | ICD-10-CM | POA: Diagnosis not present

## 2021-12-31 DIAGNOSIS — M1990 Unspecified osteoarthritis, unspecified site: Secondary | ICD-10-CM | POA: Diagnosis not present

## 2021-12-31 DIAGNOSIS — I428 Other cardiomyopathies: Secondary | ICD-10-CM | POA: Diagnosis not present

## 2021-12-31 DIAGNOSIS — Z79899 Other long term (current) drug therapy: Secondary | ICD-10-CM | POA: Diagnosis not present

## 2022-01-02 DIAGNOSIS — R42 Dizziness and giddiness: Secondary | ICD-10-CM | POA: Diagnosis not present

## 2022-01-02 DIAGNOSIS — I428 Other cardiomyopathies: Secondary | ICD-10-CM | POA: Diagnosis not present

## 2022-01-02 DIAGNOSIS — I10 Essential (primary) hypertension: Secondary | ICD-10-CM | POA: Diagnosis not present

## 2022-01-02 DIAGNOSIS — E7800 Pure hypercholesterolemia, unspecified: Secondary | ICD-10-CM | POA: Diagnosis not present

## 2022-01-02 DIAGNOSIS — I2510 Atherosclerotic heart disease of native coronary artery without angina pectoris: Secondary | ICD-10-CM | POA: Diagnosis not present

## 2022-01-02 DIAGNOSIS — M0609 Rheumatoid arthritis without rheumatoid factor, multiple sites: Secondary | ICD-10-CM | POA: Diagnosis not present

## 2022-01-02 DIAGNOSIS — E118 Type 2 diabetes mellitus with unspecified complications: Secondary | ICD-10-CM | POA: Diagnosis not present

## 2022-01-08 ENCOUNTER — Ambulatory Visit (INDEPENDENT_AMBULATORY_CARE_PROVIDER_SITE_OTHER): Payer: Medicare Other | Admitting: Neurology

## 2022-01-08 ENCOUNTER — Encounter: Payer: Self-pay | Admitting: Neurology

## 2022-01-08 VITALS — BP 144/81 | HR 89 | Ht 70.0 in | Wt 178.0 lb

## 2022-01-08 DIAGNOSIS — G20 Parkinson's disease: Secondary | ICD-10-CM

## 2022-01-08 DIAGNOSIS — G3184 Mild cognitive impairment, so stated: Secondary | ICD-10-CM

## 2022-01-08 DIAGNOSIS — G4752 REM sleep behavior disorder: Secondary | ICD-10-CM

## 2022-01-08 MED ORDER — CARBIDOPA-LEVODOPA 25-100 MG PO TABS: ORAL_TABLET | ORAL | 3 refills | Status: DC

## 2022-01-08 MED ORDER — CLONAZEPAM 0.5 MG PO TABS: ORAL_TABLET | ORAL | 5 refills | Status: DC
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
## 2022-01-30 DIAGNOSIS — M0609 Rheumatoid arthritis without rheumatoid factor, multiple sites: Secondary | ICD-10-CM | POA: Diagnosis not present

## 2022-02-08 DIAGNOSIS — Z23 Encounter for immunization: Secondary | ICD-10-CM | POA: Diagnosis not present

## 2022-02-12 ENCOUNTER — Ambulatory Visit: Payer: Medicare Other | Admitting: Podiatry

## 2022-02-13 ENCOUNTER — Ambulatory Visit: Payer: Medicare Other | Admitting: Podiatry

## 2022-02-16 DIAGNOSIS — Z23 Encounter for immunization: Secondary | ICD-10-CM | POA: Diagnosis not present

## 2022-02-21 ENCOUNTER — Ambulatory Visit (INDEPENDENT_AMBULATORY_CARE_PROVIDER_SITE_OTHER): Payer: Medicare Other | Admitting: Podiatry

## 2022-02-21 DIAGNOSIS — B351 Tinea unguium: Secondary | ICD-10-CM

## 2022-02-21 DIAGNOSIS — M79675 Pain in left toe(s): Secondary | ICD-10-CM

## 2022-02-21 DIAGNOSIS — M79674 Pain in right toe(s): Secondary | ICD-10-CM

## 2022-02-21 NOTE — Progress Notes (Signed)
Subjective: Chief Complaint  Patient presents with   Diabetes    Diabetic foot care, A1c- 5.3, Nail trim    76 year old male presents for above concerns.  States he needs to have the nails trimmed is not able to do them himself.  No swelling redness or any drainage.  Nails are causing discomfort.  He states he previously had a biopsy performed on the right big toe which confirmed fungus but did not have any treatment at that time.  He did use an over-the-counter medicine which was helpful but stopped using it.  No open lesions.  Objective: AAO x3, NAD DP/PT pulses palpable bilaterally, CRT less than 3 seconds Nails are hypertrophic, dystrophic, brittle, discolored, elongated 10.  Nails affected mostly of the hallux.  No surrounding redness or drainage. Tenderness nails 1-5 bilaterally. No open lesions or pre-ulcerative lesions are identified today.  No pain with calf compression, swelling, warmth, erythema  Assessment: Symptomatic onychomycosis  Plan: -All treatment options discussed with the patient including all alternatives, risks, complications.  -The nails today were sharply debrided nails x10 without any complications or bleeding.  Recommended to at least go back on the over-the-counter medication that he was using previously. -Patient encouraged to call the office with any questions, concerns, change in symptoms.   Trula Slade DPM

## 2022-03-04 DIAGNOSIS — M0609 Rheumatoid arthritis without rheumatoid factor, multiple sites: Secondary | ICD-10-CM | POA: Diagnosis not present

## 2022-03-13 ENCOUNTER — Ambulatory Visit: Payer: Medicare Other | Admitting: Cardiology

## 2022-03-13 ENCOUNTER — Encounter: Payer: Self-pay | Admitting: Cardiology

## 2022-03-13 VITALS — BP 130/78 | HR 80 | Temp 98.0°F | Resp 16 | Ht 70.0 in | Wt 180.0 lb

## 2022-03-13 DIAGNOSIS — R931 Abnormal findings on diagnostic imaging of heart and coronary circulation: Secondary | ICD-10-CM

## 2022-03-13 DIAGNOSIS — I10 Essential (primary) hypertension: Secondary | ICD-10-CM | POA: Diagnosis not present

## 2022-03-13 DIAGNOSIS — I428 Other cardiomyopathies: Secondary | ICD-10-CM

## 2022-03-13 MED ORDER — CARVEDILOL 6.25 MG PO TABS: 6.2500 mg | ORAL_TABLET | Freq: Two times a day (BID) | ORAL | 3 refills | Status: DC

## 2022-03-13 NOTE — Progress Notes (Signed)
Patient is here for follow up visit.  Subjective:   Jemuel Laursen, male    DOB: 1945/06/13, 76 y.o.   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 Blood pressure is well controlled. No symptoms today. He works out twice a week with a Physiological scientist without any chest pain, shortness of breath.    Current Outpatient Medications:    Abatacept (ORENCIA IV), Inject into the vein. Gets every 4 weeks for awhile and then goes to monthly infusions, Disp: , Rfl:    albuterol (VENTOLIN HFA) 108 (90 Base) MCG/ACT inhaler, Inhale into the lungs., Disp: , Rfl:    aspirin 81 MG chewable tablet, 1 tablet, Disp: , Rfl:    atorvastatin (LIPITOR) 40 MG tablet, Take 40 mg by mouth daily., Disp: , Rfl:    carbidopa-levodopa (SINEMET IR) 25-100 MG tablet, TAKE 1 TABLET 3 TIMES A DAYWITH WATER 30 MINUTES      BEFORE MEALS, Disp: 270 tablet, Rfl: 3   carvedilol (COREG) 6.25 MG tablet, Take 1 tablet (6.25 mg total) by mouth 2 (two) times daily., Disp: 180 tablet, Rfl: 3   Cholecalciferol 50 MCG (2000 UT) TABS, Take 2,000 Units by mouth daily., Disp: , Rfl:    clonazePAM (KLONOPIN) 0.5 MG tablet, At bedtime prn, use 1/2 tab and if needed add second half., Disp: 30 tablet, Rfl: 5   fluticasone (FLONASE) 50 MCG/ACT nasal spray, Place 1 spray into both nostrils daily as needed for rhinitis (drainage issues.). , Disp: , Rfl:    furosemide (LASIX) 20 MG tablet, TAKE 1 TABLET (20 MG TOTAL) BY MOUTH AS NEEDED., Disp: 60 tablet, Rfl: 2   KLOR-CON M10 10 MEQ tablet, Take 10 mEq by mouth daily., Disp: , Rfl:    leflunomide (ARAVA) 20 MG tablet, Take 20 mg by mouth daily., Disp: , Rfl:    losartan (COZAAR) 25 MG tablet, Take  100 mg by mouth at bedtime., Disp: , Rfl:    montelukast (SINGULAIR) 10 MG tablet, Take 10 mg by mouth every evening., Disp: , Rfl:    SYNJARDY XR 09-998 MG TB24, Take 2 tablets by mouth daily. , Disp: , Rfl:     Cardiovascular studies:  EKG 03/13/2022: Sinus rhythm 64 bpm  Left bundle branch block   Echocardiogram 09/27/2020:  Normal LV systolic function with visual EF 60-65%. Left ventricle cavity  is normal in size. Normal global wall motion. Indeterminate diastolic  filling pattern, normal LAP. Calculated EF 69%.  No significant valvular heart disease.  Findings to suggest non-compacted myocardium. Clinical correlation is  required.  Compared to study dated 05/04/2018 no significant change.    Lexiscan Tetrofosmin Stress Test  09/04/2020: Nondiagnostic ECG stress. Underlying LBBB. Myocardial perfusion is normal with mild diaphragmatic attenuation. LV is mildly dilated both in rest and stress images. Overall LV systolic function is abnormal with septal motion consistent with LBBB. Stress LV EF: 34%. No previous exam available for comparison. High risk study due to low LVEF. Correlate LVEF with echocardiogram.  CT cardiac scoring 08/14/2020: Total score 443 (50-75th percentile) LM: 33 LAD: 9 LCx: 70 RCA: 340 PDA: 0  Abdominal Aortic Duplex 04/18/2020:  The maximum aorta (sac) diameter is 2.34 cm (mid).  There is mild ectasia  in the mid abdominal aorta.  Moderate diffuse calcific plaque observed throughout the abdominal aorta.  Normal flow velocities noted in the aorta and bilateral common iliac  arteries.  No AAA noted. Consider repeat scan in 5-10 years for stability of ectasia.  Coronary angiography 05/26/2018: LM: Normal LAD: Normal LCx: Normal Ramus intermedius: Normal RCA: Mid focal 20% disease LVEDP normal   Conclusion: Minimal nonobstructive coronary artery disease Normal LVEDP  Cardiac MRI 06/18/2018: 1. Mild LVE with diffuse hypokinesis and abnormal  septal motion EF 42%  2. Mild ventricular non compaction ratio using posterior wall thickness 2:1 3.  Normal RV size and function   Recent labs: 07/11/2021: Chol 155, TG 51, HDL 62, LDL 83  02/06/2021: Chol 154, TG 62, HDL 63, LDL 78  09/14/2020: H/H 13/43. MCV 90. Platelets 175 Chol 167, TG 98, HDL 62, LDL 93   Review of Systems  Cardiovascular:  Negative for chest pain, dyspnea on exertion, leg swelling, palpitations and syncope.       Objective:    Vitals:   03/13/22 0939 03/13/22 0947  BP: (!) 144/87 130/78  Pulse: 85 80  Resp: 16   Temp: 98 F (36.7 C)   SpO2: 93%      Physical Exam Vitals and nursing note reviewed.  Constitutional:      General: He is not in acute distress. Neck:     Vascular: No JVD.  Cardiovascular:     Rate and Rhythm: Normal rate and regular rhythm.     Heart sounds: Normal heart sounds. No murmur heard. Pulmonary:     Effort: Pulmonary effort is normal.     Breath sounds: Normal breath sounds. No wheezing or rales.  Musculoskeletal:     Right lower leg: No edema.     Left lower leg: No edema.       ICD-10-CM   1. Essential hypertension  I10 EKG 12-Lead     No orders of the defined types were placed in this encounter.      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 Okay to take lasix only as needed.  Elevated coronary calcium score: Total score of 443, including 33 in left main. No anginal symptoms at this time. Stress test with no ischemia (09/2020).  Negative stress LVEF, similar to previous study in 2020. Coronary angiogram in 2020 with no significant CAD. It is possible that he is developed more coronary calcium deposition since  then. Continue aspirin 81 mg daily. LDL 83, HDL 62 on Lipitor 20 mg (06/2021). Upcoming lipid panel with PCP next month.   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 EF 42% without any heart failure signs/symptoms. Reasonable not to use anticoagulation.   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 Repeat ultrasound in 2030 Continue statin  Type 2 DM: Continue current therapy. Managed by PCP.  F/u in 6 months   Nigel Mormon, MD Pager: [phone] Office: [phone]

## 2022-03-19 DIAGNOSIS — E119 Type 2 diabetes mellitus without complications: Secondary | ICD-10-CM | POA: Diagnosis not present

## 2022-03-19 DIAGNOSIS — Z961 Presence of intraocular lens: Secondary | ICD-10-CM | POA: Diagnosis not present

## 2022-04-01 DIAGNOSIS — M0609 Rheumatoid arthritis without rheumatoid factor, multiple sites: Secondary | ICD-10-CM | POA: Diagnosis not present

## 2022-04-01 DIAGNOSIS — I10 Essential (primary) hypertension: Secondary | ICD-10-CM | POA: Diagnosis not present

## 2022-04-01 DIAGNOSIS — E118 Type 2 diabetes mellitus with unspecified complications: Secondary | ICD-10-CM | POA: Diagnosis not present

## 2022-04-01 DIAGNOSIS — E7800 Pure hypercholesterolemia, unspecified: Secondary | ICD-10-CM | POA: Diagnosis not present

## 2022-04-03 DIAGNOSIS — Z79899 Other long term (current) drug therapy: Secondary | ICD-10-CM | POA: Diagnosis not present

## 2022-04-03 DIAGNOSIS — M25552 Pain in left hip: Secondary | ICD-10-CM | POA: Diagnosis not present

## 2022-04-03 DIAGNOSIS — I428 Other cardiomyopathies: Secondary | ICD-10-CM | POA: Diagnosis not present

## 2022-04-03 DIAGNOSIS — M0609 Rheumatoid arthritis without rheumatoid factor, multiple sites: Secondary | ICD-10-CM | POA: Diagnosis not present

## 2022-04-03 DIAGNOSIS — M1990 Unspecified osteoarthritis, unspecified site: Secondary | ICD-10-CM | POA: Diagnosis not present

## 2022-04-03 DIAGNOSIS — M2550 Pain in unspecified joint: Secondary | ICD-10-CM | POA: Diagnosis not present

## 2022-04-03 DIAGNOSIS — M25569 Pain in unspecified knee: Secondary | ICD-10-CM | POA: Diagnosis not present

## 2022-04-03 DIAGNOSIS — R768 Other specified abnormal immunological findings in serum: Secondary | ICD-10-CM | POA: Diagnosis not present

## 2022-04-03 DIAGNOSIS — M8580 Other specified disorders of bone density and structure, unspecified site: Secondary | ICD-10-CM | POA: Diagnosis not present

## 2022-04-29 DIAGNOSIS — M0609 Rheumatoid arthritis without rheumatoid factor, multiple sites: Secondary | ICD-10-CM | POA: Diagnosis not present

## 2022-04-30 DIAGNOSIS — L821 Other seborrheic keratosis: Secondary | ICD-10-CM | POA: Diagnosis not present

## 2022-05-09 DIAGNOSIS — E118 Type 2 diabetes mellitus with unspecified complications: Secondary | ICD-10-CM | POA: Diagnosis not present

## 2022-05-09 DIAGNOSIS — E7800 Pure hypercholesterolemia, unspecified: Secondary | ICD-10-CM | POA: Diagnosis not present

## 2022-05-09 DIAGNOSIS — I2510 Atherosclerotic heart disease of native coronary artery without angina pectoris: Secondary | ICD-10-CM | POA: Diagnosis not present

## 2022-05-09 DIAGNOSIS — I10 Essential (primary) hypertension: Secondary | ICD-10-CM | POA: Diagnosis not present

## 2022-05-09 DIAGNOSIS — I428 Other cardiomyopathies: Secondary | ICD-10-CM | POA: Diagnosis not present

## 2022-05-24 ENCOUNTER — Ambulatory Visit: Payer: Medicare Other | Admitting: Podiatry

## 2022-06-03 DIAGNOSIS — M0609 Rheumatoid arthritis without rheumatoid factor, multiple sites: Secondary | ICD-10-CM | POA: Diagnosis not present

## 2022-06-05 ENCOUNTER — Encounter: Payer: Self-pay | Admitting: Podiatry

## 2022-06-05 ENCOUNTER — Ambulatory Visit (INDEPENDENT_AMBULATORY_CARE_PROVIDER_SITE_OTHER): Payer: Medicare Other | Admitting: Podiatry

## 2022-06-05 DIAGNOSIS — M79675 Pain in left toe(s): Secondary | ICD-10-CM

## 2022-06-05 DIAGNOSIS — B351 Tinea unguium: Secondary | ICD-10-CM

## 2022-06-05 DIAGNOSIS — M79674 Pain in right toe(s): Secondary | ICD-10-CM | POA: Diagnosis not present

## 2022-06-05 NOTE — Progress Notes (Signed)
This patient presents to the office with chief complaint of long thick painful nails.  Patient says the nails are painful walking and wearing shoes.  This patient is unable to self treat.  This patient is unable to trim his nails since he is unable to reach his nails.  She presents to the office for preventative foot care services.  General Appearance  Alert, conversant and in no acute stress.  Vascular  Dorsalis pedis and posterior tibial  pulses are palpable  bilaterally.  Capillary return is within normal limits  bilaterally. Temperature is within normal limits  bilaterally.  Neurologic  Senn-Weinstein monofilament wire test within normal limits  bilaterally. Muscle power within normal limits bilaterally.  Nails Thick disfigured discolored nails with subungual debris  from hallux to fifth toes bilaterally. No evidence of bacterial infection or drainage bilaterally.  Orthopedic  No limitations of motion  feet .  No crepitus or effusions noted.  No bony pathology or digital deformities noted.  Skin  normotropic skin with no porokeratosis noted bilaterally.  No signs of infections or ulcers noted.     Onychomycosis  Nails  B/L.  Pain in right toes  Pain in left toes  Debridement of nails both feet followed trimming the nails with dremel tool.    RTC 4  months.   Gardiner Barefoot DPM

## 2022-06-06 DIAGNOSIS — I2510 Atherosclerotic heart disease of native coronary artery without angina pectoris: Secondary | ICD-10-CM | POA: Diagnosis not present

## 2022-06-06 DIAGNOSIS — E118 Type 2 diabetes mellitus with unspecified complications: Secondary | ICD-10-CM | POA: Diagnosis not present

## 2022-06-06 DIAGNOSIS — I428 Other cardiomyopathies: Secondary | ICD-10-CM | POA: Diagnosis not present

## 2022-06-06 DIAGNOSIS — E7800 Pure hypercholesterolemia, unspecified: Secondary | ICD-10-CM | POA: Diagnosis not present

## 2022-06-06 DIAGNOSIS — I10 Essential (primary) hypertension: Secondary | ICD-10-CM | POA: Diagnosis not present

## 2022-07-01 DIAGNOSIS — Z1159 Encounter for screening for other viral diseases: Secondary | ICD-10-CM | POA: Diagnosis not present

## 2022-07-01 DIAGNOSIS — M0609 Rheumatoid arthritis without rheumatoid factor, multiple sites: Secondary | ICD-10-CM | POA: Diagnosis not present

## 2022-07-10 DIAGNOSIS — E118 Type 2 diabetes mellitus with unspecified complications: Secondary | ICD-10-CM | POA: Diagnosis not present

## 2022-07-10 DIAGNOSIS — Z125 Encounter for screening for malignant neoplasm of prostate: Secondary | ICD-10-CM | POA: Diagnosis not present

## 2022-07-10 DIAGNOSIS — E039 Hypothyroidism, unspecified: Secondary | ICD-10-CM | POA: Diagnosis not present

## 2022-07-11 ENCOUNTER — Telehealth: Payer: Self-pay | Admitting: Neurology

## 2022-07-11 ENCOUNTER — Encounter: Payer: Self-pay | Admitting: Neurology

## 2022-07-11 ENCOUNTER — Ambulatory Visit (INDEPENDENT_AMBULATORY_CARE_PROVIDER_SITE_OTHER): Payer: Medicare Other | Admitting: Neurology

## 2022-07-11 VITALS — BP 131/82 | HR 84 | Ht 70.5 in | Wt 177.0 lb

## 2022-07-11 DIAGNOSIS — G20C Parkinsonism, unspecified: Secondary | ICD-10-CM

## 2022-07-11 DIAGNOSIS — G912 (Idiopathic) normal pressure hydrocephalus: Secondary | ICD-10-CM | POA: Diagnosis not present

## 2022-07-11 DIAGNOSIS — G4752 REM sleep behavior disorder: Secondary | ICD-10-CM

## 2022-07-11 DIAGNOSIS — G3184 Mild cognitive impairment, so stated: Secondary | ICD-10-CM

## 2022-07-11 DIAGNOSIS — N1831 Chronic kidney disease, stage 3a: Secondary | ICD-10-CM

## 2022-07-11 DIAGNOSIS — I428 Other cardiomyopathies: Secondary | ICD-10-CM

## 2022-07-11 DIAGNOSIS — R490 Dysphonia: Secondary | ICD-10-CM

## 2022-07-11 MED ORDER — CARBIDOPA-LEVODOPA 25-100 MG PO TABS: ORAL_TABLET | ORAL | 3 refills | Status: DC

## 2022-07-11 MED ORDER — DONEPEZIL HCL 5 MG PO TABS: 5.0000 mg | ORAL_TABLET | Freq: Every morning | ORAL | 4 refills | Status: DC

## 2022-07-11 MED ORDER — CLONAZEPAM 0.5 MG PO TABS: ORAL_TABLET | ORAL | 5 refills | Status: DC

## 2022-07-11 NOTE — Telephone Encounter (Signed)
medicare/BCBS sup NPR sent to GI [phone] 
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
## 2022-07-11 NOTE — Patient Instructions (Signed)
Donepezil Tablets What is this medication? DONEPEZIL (doe NEP e zil) treats memory loss and confusion (dementia) in people who have Alzheimer disease. It works by improving attention, memory, and the ability to engage in daily activities. It is not a cure for dementia or Alzheimer disease. This medicine may be used for other purposes; ask your health care provider or pharmacist if you have questions. COMMON BRAND NAME(S): Aricept What should I tell my care team before I take this medication? They need to know if you have any of these conditions: Asthma or other lung disease Difficulty passing urine Head injury Heart disease History of irregular heartbeat Liver disease Seizures (convulsions) Stomach or intestinal disease, ulcers or stomach bleeding An unusual or allergic reaction to donepezil, other medications, foods, dyes, or preservatives Pregnant or trying to get pregnant Breast-feeding How should I use this medication? Take this medication by mouth with a glass of water. Follow the directions on the prescription label. You may take this medication with or without food. Take this medication at regular intervals. This medication is usually taken before bedtime. Do not take it more often than directed. Continue to take your medication even if you feel better. Do not stop taking except on your care team's advice. If you are taking the 23 mg donepezil tablet, swallow it whole; do not cut, crush, or chew it. Talk to your care team about the use of this medication in children. Special care may be needed. Overdosage: If you think you have taken too much of this medicine contact a poison control center or emergency room at once. NOTE: This medicine is only for you. Do not share this medicine with others. What if I miss a dose? If you miss a dose, take it as soon as you can. If it is almost time for your next dose, take only that dose, do not take double or extra doses. What may interact with  this medication? Do not take this medication with any of the following: Certain medications for fungal infections like itraconazole, fluconazole, posaconazole, and voriconazole Cisapride Dextromethorphan; quinidine Dronedarone Pimozide Quinidine Thioridazine This medication may also interact with the following: Antihistamines for allergy, cough and cold Atropine Bethanechol Carbamazepine Certain medications for bladder problems like oxybutynin, tolterodine Certain medications for Parkinson's disease like benztropine, trihexyphenidyl Certain medications for stomach problems like dicyclomine, hyoscyamine Certain medications for travel sickness like scopolamine Dexamethasone Dofetilide Ipratropium NSAIDs, medications for pain and inflammation, like ibuprofen or naproxen Other medications for Alzheimer's disease Other medications that prolong the QT interval (cause an abnormal heart rhythm) Phenobarbital Phenytoin Rifampin, rifabutin or rifapentine Ziprasidone This list may not describe all possible interactions. Give your health care provider a list of all the medicines, herbs, non-prescription drugs, or dietary supplements you use. Also tell them if you smoke, drink alcohol, or use illegal drugs. Some items may interact with your medicine. What should I watch for while using this medication? Visit your care team for regular checks on your progress. Check with your care team if your symptoms do not get better or if they get worse. You may get drowsy or dizzy. Do not drive, use machinery, or do anything that needs mental alertness until you know how this medication affects you. What side effects may I notice from receiving this medication? Side effects that you should report to your care team as soon as possible: Allergic reactions--skin rash, itching, hives, swelling of the face, lips, tongue, or throat Peptic ulcer--burning stomach pain, loss of appetite, bloating, burping,  heartburn, nausea, vomiting Seizures Slow heartbeat--dizziness, feeling faint or lightheaded, confusion, trouble breathing, unusual weakness or fatigue Stomach bleeding--bloody or black, tar-like stools, vomiting blood or brown material that looks like coffee grounds Trouble passing urine Side effects that usually do not require medical attention (report these to your care team if they continue or are bothersome): Diarrhea Fatigue Loss of appetite Muscle pain or cramps Nausea Trouble sleeping This list may not describe all possible side effects. Call your doctor for medical advice about side effects. You may report side effects to FDA at 1-800-FDA-1088. Where should I keep my medication? Keep out of reach of children. Store at room temperature between 15 and 30 degrees C (59 and 86 degrees F). Throw away any unused medication after the expiration date. NOTE: This sheet is a summary. It may not cover all possible information. If you have questions about this medicine, talk to your doctor, pharmacist, or health care provider.  2023 Elsevier/Gold Standard (2020-11-23 00:00:00) Parkinson's Disease Parkinson's disease is a movement disorder. It is a long-term condition that gets worse over time. Each person with Parkinson's disease is affected differently. This condition limits a person's ability to control movements and move the body normally. The condition can range from mild to severe. Parkinson's disease tends to get worse slowly over several years. What are the causes? Parkinson's disease is caused by a loss of brain cells (neurons) that make a brain chemical called dopamine. Dopamine is needed to control movement. As the condition gets worse, more neurons that make dopamine die. This makes it hard to move or control your movements. The exact cause of the loss of neurons is not known. Genes and the environment may contribute to the cause of Parkinson's disease. What increases the risk? The  following factors may make you more likely to develop this condition: Being male. Being age 38 or older. Having a family history of Parkinson's disease. Having had a traumatic brain injury. Having been exposed to toxins, such as pesticides. Having depression. What are the signs or symptoms? Symptoms of this condition can vary. The main symptoms are related to movement. These include: A tremor or shaking while you are resting. You cannot control the shaking. Stiffness in your arms and legs (rigidity). Slowing of movement. You may lose facial expressions and have trouble making small movements that are needed to button clothing or brush your teeth. An abnormal walk. You may walk with short, shuffling steps. Loss of balance and stability when standing. You may sway, fall backward, and have trouble making turns. Other symptoms include: Mental or cognitive changes, including: Depression or anxiety. Having false beliefs (delusions). Seeing, hearing, or feeling things that do not exist (hallucinations). Trouble speaking or swallowing. Changes in bowel or bladder functions, including constipation, having to go urgently or frequently, or not being able to control your bowel or bladder. Changes in sleep habits, acting out dreams, or trouble sleeping. Depending on the severity of the symptoms, Parkinson's disease may be mild, moderate, or advanced. Parkinson's disease progression is different for everyone. Some people may not progress to the advanced stage. Mild Parkinson's disease involves: Movement problems that do not affect daily activities. Movement problems on one side of the body. Moderate Parkinson's disease involves: Movement problems on both sides of the body. Slowing of movement. Coordination and balance problems. Advanced Parkinson's disease involves: Extreme difficulty walking. Inability to live alone safely. Signs of dementia, such as having trouble remembering things, doing daily  tasks such as getting dressed, and problem  solving. How is this diagnosed? This condition is diagnosed by a specialist. A diagnosis may be made based on symptoms, your medical history, and a physical exam. You may also have brain imaging tests to check for loss of neurons in the brain. How is this treated? There is no cure for Parkinson's disease. Treatment focuses on managing your symptoms. Treatment may include: Medicines. Everyone responds to medicines differently. Your response may change over time. Work with your health care provider to find the best medicines for you. Speech, occupational, and physical therapy. Deep brain stimulation surgery to reduce tremors and other involuntary movements. Follow these instructions at home: Medicines Take over-the-counter and prescription medicines only as told by your health care provider. Avoid taking medicines that can affect thinking, such as pain or sleeping medicines. Eating and drinking Follow instructions from your health care provider about eating or drinking restrictions. Do not drink alcohol. Activity Ask your health care provider if it is safe for you to drive. Do exercises as told by your health care provider or physical therapist. Lifestyle  Install grab bars and railings in your home to prevent falls. Do not use any products that contain nicotine or tobacco. These products include cigarettes, chewing tobacco, and vaping devices, such as e-cigarettes. If you need help quitting, ask your health care provider. Consider joining a support group for people with Parkinson's disease. General instructions Work with your health care provider to know the kind of day-to-day help that you may need and what to do to stay safe. Keep all follow-up visits. This is important. Follow-up visits include any visits with a physical therapist, speech therapist, or occupational therapist. Where to find more information [employer] of Neurological  Disorders and Stroke: [employer] [place]: www.parkinson.org Contact a health care provider if: Medicines do not help your symptoms. You are unsteady or have fallen at home. You need more support to function well at home. You have trouble swallowing. You have severe constipation. You are having problems with side effects from your medicines. You feel confused, anxious, depressed, or have hallucinations. Get help right away if you: Are injured after a fall. Cannot swallow without choking. Have chest pain or trouble breathing. Do not feel safe at home. Have thoughts about hurting yourself or others. These symptoms may represent a serious problem that is an emergency. Do not wait to see if the symptoms will go away. Get medical help right away. Call your local emergency services (911 in the [place] Do not drive yourself to the hospital. If you ever feel like you may hurt yourself or others, or have thoughts about taking your own life, get help right away. Go to your nearest emergency department or: Call your local emergency services (911 in the [place] Call a suicide crisis helpline, such as the [place] at 864-796-7686 or 988 in the [place]. This is open 24 hours a day in the [place] Text the Crisis Text Line at 5706776061 (in the [place].). Summary Parkinson's disease is a long-term condition that gets worse over time. This condition limits your ability to control your movements and move your body normally. There is no cure for Parkinson's disease. Treatment focuses on managing your symptoms. Work with your health care provider to know the kind of day-to-day help that you may need and what to do to stay safe. Keep all follow-up visits, including any visits with a physical therapist, speech therapist, or occupational therapist. This is important. This information is not intended to replace advice  given to you by your health care provider. Make sure you  discuss any questions you have with your health care provider. Document Revised: 11/29/2020 Document Reviewed: 08/21/2020 Elsevier Patient Education  [place].

## 2022-07-12 LAB — COMPREHENSIVE METABOLIC PANEL
ALT: 9 IU/L (ref 0–44)
AST: 15 IU/L (ref 0–40)
Albumin/Globulin Ratio: 2.2 (ref 1.2–2.2)
Albumin: 4.6 g/dL (ref 3.8–4.8)
Alkaline Phosphatase: 57 IU/L (ref 44–121)
BUN/Creatinine Ratio: 9 — ABNORMAL LOW (ref 10–24)
BUN: 10 mg/dL (ref 8–27)
Bilirubin Total: 0.4 mg/dL (ref 0.0–1.2)
CO2: 22 mmol/L (ref 20–29)
Calcium: 10 mg/dL (ref 8.6–10.2)
Chloride: 105 mmol/L (ref 96–106)
Creatinine, Ser: 1.16 mg/dL (ref 0.76–1.27)
Globulin, Total: 2.1 g/dL (ref 1.5–4.5)
Glucose: 95 mg/dL (ref 70–99)
Potassium: 4.2 mmol/L (ref 3.5–5.2)
Sodium: 143 mmol/L (ref 134–144)
Total Protein: 6.7 g/dL (ref 6.0–8.5)
eGFR: 66 mL/min/{1.73_m2} (ref >59)

## 2022-07-12 LAB — CBC WITH DIFFERENTIAL/PLATELET
Basophils Absolute: 0 10*3/uL (ref 0.0–0.2)
Basos: 0 %
EOS (ABSOLUTE): 0.1 10*3/uL (ref 0.0–0.4)
Eos: 1 %
Hematocrit: 41.5 % (ref 37.5–51.0)
Hemoglobin: 13.5 g/dL (ref 13.0–17.7)
Immature Grans (Abs): 0 10*3/uL (ref 0.0–0.1)
Immature Granulocytes: 0 %
Lymphocytes Absolute: 0.7 10*3/uL (ref 0.7–3.1)
Lymphs: 16 %
MCH: 29.7 pg (ref 26.6–33.0)
MCHC: 32.5 g/dL (ref 31.5–35.7)
MCV: 91 fL (ref 79–97)
Monocytes Absolute: 0.5 10*3/uL (ref 0.1–0.9)
Monocytes: 11 %
Neutrophils Absolute: 3.4 10*3/uL (ref 1.4–7.0)
Neutrophils: 72 %
Platelets: 199 10*3/uL (ref 150–450)
RBC: 4.54 x10E6/uL (ref 4.14–5.80)
RDW: 11.9 % (ref 11.6–15.4)
WBC: 4.7 10*3/uL (ref 3.4–10.8)

## 2022-07-15 ENCOUNTER — Encounter: Payer: Self-pay | Admitting: Neurology

## 2022-07-16 DIAGNOSIS — M0609 Rheumatoid arthritis without rheumatoid factor, multiple sites: Secondary | ICD-10-CM | POA: Diagnosis not present

## 2022-07-16 DIAGNOSIS — R918 Other nonspecific abnormal finding of lung field: Secondary | ICD-10-CM | POA: Diagnosis not present

## 2022-07-16 DIAGNOSIS — I2510 Atherosclerotic heart disease of native coronary artery without angina pectoris: Secondary | ICD-10-CM | POA: Diagnosis not present

## 2022-07-16 DIAGNOSIS — E118 Type 2 diabetes mellitus with unspecified complications: Secondary | ICD-10-CM | POA: Diagnosis not present

## 2022-07-16 DIAGNOSIS — I714 Abdominal aortic aneurysm, without rupture, unspecified: Secondary | ICD-10-CM | POA: Diagnosis not present

## 2022-07-16 DIAGNOSIS — I10 Essential (primary) hypertension: Secondary | ICD-10-CM | POA: Diagnosis not present

## 2022-07-16 DIAGNOSIS — Z23 Encounter for immunization: Secondary | ICD-10-CM | POA: Diagnosis not present

## 2022-07-16 DIAGNOSIS — Z0000 Encounter for general adult medical examination without abnormal findings: Secondary | ICD-10-CM | POA: Diagnosis not present

## 2022-07-16 DIAGNOSIS — G4752 REM sleep behavior disorder: Secondary | ICD-10-CM | POA: Diagnosis not present

## 2022-07-16 DIAGNOSIS — J4521 Mild intermittent asthma with (acute) exacerbation: Secondary | ICD-10-CM | POA: Diagnosis not present

## 2022-07-16 DIAGNOSIS — G20C Parkinsonism, unspecified: Secondary | ICD-10-CM | POA: Diagnosis not present

## 2022-07-16 DIAGNOSIS — J309 Allergic rhinitis, unspecified: Secondary | ICD-10-CM | POA: Diagnosis not present

## 2022-07-24 ENCOUNTER — Ambulatory Visit
Admission: RE | Admit: 2022-07-24 | Discharge: 2022-07-24 | Disposition: A | Discharge status: Home / Self Care | Payer: Medicare Other | Source: Ambulatory Visit | Attending: Neurology | Admitting: Neurology

## 2022-07-24 DIAGNOSIS — G4752 REM sleep behavior disorder: Secondary | ICD-10-CM | POA: Diagnosis not present

## 2022-07-24 DIAGNOSIS — R490 Dysphonia: Secondary | ICD-10-CM | POA: Diagnosis not present

## 2022-07-24 DIAGNOSIS — G3184 Mild cognitive impairment, so stated: Secondary | ICD-10-CM | POA: Diagnosis not present

## 2022-07-24 DIAGNOSIS — G20C Parkinsonism, unspecified: Secondary | ICD-10-CM | POA: Diagnosis not present

## 2022-07-24 MED ORDER — GADOPICLENOL 0.5 MMOL/ML IV SOLN
8.0000 mL | Freq: Once | INTRAVENOUS | Status: AC | PRN
  Administered 2022-07-24: 8 mL via INTRAVENOUS

## 2022-07-24 NOTE — Progress Notes (Signed)
Patient with Parkinson's disease and gait slowing, cognitive concerns. Dr Leonie Man indicated a possible NPH ( normal pressure hydrocephalus ) could be present, but the patient does not struggle with incontinence ( usually required symptom) .  I will ask for the patient to have a phone conversation or video visit with me and we can discuss: referral for PT evaluation before and after LP

## 2022-07-29 ENCOUNTER — Telehealth: Payer: Self-pay | Admitting: Neurology

## 2022-07-29 DIAGNOSIS — M0609 Rheumatoid arthritis without rheumatoid factor, multiple sites: Secondary | ICD-10-CM | POA: Diagnosis not present

## 2022-07-29 DIAGNOSIS — Z1212 Encounter for screening for malignant neoplasm of rectum: Secondary | ICD-10-CM | POA: Diagnosis not present

## 2022-07-29 DIAGNOSIS — Z1211 Encounter for screening for malignant neoplasm of colon: Secondary | ICD-10-CM | POA: Diagnosis not present

## 2022-07-29 NOTE — Telephone Encounter (Signed)
Called the patient and there was no answer. I can see the pt saw his results on mychart. LVM advising the pt I was calling to discuss further. Dr Dohmeier mentioned scheduling the patient ofr a mychart VV or follow up and I had an opening I placed on hold for 3/14 for the pt.   **If pt returns call and would like to discuss results with me, I can otherwise if he is ok scheduling with Dr Brett Fairy and discussing further with her, please offer 3/14 or any sooner apt for the patient with Dr Brett Fairy

## 2022-07-29 NOTE — Telephone Encounter (Signed)
-----   Message from Larey Seat, MD sent at 07/24/2022  1:51 PM EST ----- Patient with Parkinson's disease and gait slowing, cognitive concerns. Dr Leonie Man indicated a possible NPH ( normal pressure hydrocephalus ) could be present, but the patient does not struggle with incontinence ( usually required symptom) .  I will ask for the patient to have a phone conversation or video visit with me and we can discuss: referral for PT evaluation before and after LP

## 2022-07-30 NOTE — Telephone Encounter (Signed)
Made a second attempt to call pt. LVM asking for a call back.

## 2022-08-01 DIAGNOSIS — I428 Other cardiomyopathies: Secondary | ICD-10-CM | POA: Diagnosis not present

## 2022-08-01 DIAGNOSIS — R768 Other specified abnormal immunological findings in serum: Secondary | ICD-10-CM | POA: Diagnosis not present

## 2022-08-01 DIAGNOSIS — M25569 Pain in unspecified knee: Secondary | ICD-10-CM | POA: Diagnosis not present

## 2022-08-01 DIAGNOSIS — M0609 Rheumatoid arthritis without rheumatoid factor, multiple sites: Secondary | ICD-10-CM | POA: Diagnosis not present

## 2022-08-01 DIAGNOSIS — M8580 Other specified disorders of bone density and structure, unspecified site: Secondary | ICD-10-CM | POA: Diagnosis not present

## 2022-08-01 DIAGNOSIS — M2550 Pain in unspecified joint: Secondary | ICD-10-CM | POA: Diagnosis not present

## 2022-08-01 DIAGNOSIS — M1990 Unspecified osteoarthritis, unspecified site: Secondary | ICD-10-CM | POA: Diagnosis not present

## 2022-08-01 DIAGNOSIS — Z79899 Other long term (current) drug therapy: Secondary | ICD-10-CM | POA: Diagnosis not present

## 2022-08-04 LAB — COLOGUARD: COLOGUARD: NEGATIVE

## 2022-08-20 DIAGNOSIS — R413 Other amnesia: Secondary | ICD-10-CM | POA: Diagnosis not present

## 2022-08-26 ENCOUNTER — Other Ambulatory Visit: Payer: Self-pay | Admitting: Neurology

## 2022-08-26 DIAGNOSIS — M0609 Rheumatoid arthritis without rheumatoid factor, multiple sites: Secondary | ICD-10-CM | POA: Diagnosis not present

## 2022-08-28 ENCOUNTER — Encounter: Payer: Self-pay | Admitting: Neurology

## 2022-08-28 ENCOUNTER — Ambulatory Visit (INDEPENDENT_AMBULATORY_CARE_PROVIDER_SITE_OTHER): Payer: Medicare Other | Admitting: Neurology

## 2022-08-28 VITALS — BP 138/80 | HR 70 | Ht 70.5 in | Wt 177.0 lb

## 2022-08-28 DIAGNOSIS — G912 (Idiopathic) normal pressure hydrocephalus: Secondary | ICD-10-CM

## 2022-08-28 DIAGNOSIS — I428 Other cardiomyopathies: Secondary | ICD-10-CM

## 2022-08-28 DIAGNOSIS — G4752 REM sleep behavior disorder: Secondary | ICD-10-CM | POA: Diagnosis not present

## 2022-08-28 DIAGNOSIS — G3184 Mild cognitive impairment, so stated: Secondary | ICD-10-CM

## 2022-08-28 DIAGNOSIS — M1990 Unspecified osteoarthritis, unspecified site: Secondary | ICD-10-CM

## 2022-08-28 DIAGNOSIS — G20C Parkinsonism, unspecified: Secondary | ICD-10-CM | POA: Insufficient documentation

## 2022-08-28 MED ORDER — DONEPEZIL HCL 5 MG PO TABS: 5.0000 mg | ORAL_TABLET | Freq: Two times a day (BID) | ORAL | 5 refills | Status: DC

## 2022-08-28 NOTE — Addendum Note (Signed)
Addended by: Judi Cong on: 08/28/2022 10:17 AM   Modules accepted: Orders

## 2022-08-28 NOTE — Patient Instructions (Signed)
Normal-Pressure Hydrocephalus Normal-pressure hydrocephalus (NPH) is a buildup of fluid (cerebrospinal fluid, or CSF) inside the brain. This does not typically increase the pressure inside the brain (intracranial pressure). CSF is normally present in the brain, but when too much CSF is produced in the brain, it can affect brain function. NPH usually occurs in people who are older than age 75. Many symptoms of NPH are also associated with aging or certain medical conditions. It is important to pay attention to changes in behavior and mental function. What are the causes? This condition may be caused by anything that blocks the flow of CSF, such as: Head injury. Infections. Bleeding from a blood vessel in the brain. In many cases, the cause of this condition is not known. What are the signs or symptoms? Symptoms of this condition may include: Difficulty walking, such as: Shuffling feet when walking. Unsteadiness. Problems when beginning to walk. Feet feeling as though they are stuck or "frozen" to the floor. Problems with bowel and bladder control. Problems with memory and thinking, such as: Forgetfulness. Lack of concentration. Mood problems. Confusion. How is this diagnosed? This condition is diagnosed based on: Your medical history. A physical exam. This can reveal walking (gait) changes or twitching or sudden muscle tightening (spasms) in the legs. Other tests to confirm the diagnosis and identify the best options for treatment. These tests include: Removal and examination of a large amount of CSF (large-volume lumbar puncture). CT scan of your head. MRI scan of your head. Cisternogram of your head. This test involves a lumbar puncture where a radioactive dye is put into your CSF. Nuclear medicine images are taken to see how well the dye is flowing through the network of cavities within your brain (ventricles). How is this treated?  This condition may be treated with surgery in  which a narrow tube (ventriculoperitoneal shunt, or VP shunt) is placed in the brain to drain excess CSF into another part of the body. After a shunt is placed, you will be monitored to make sure CSF is draining properly. Depending on your age and overall health condition, you may not be a candidate for surgery. In some cases, a shunt may be placed in the lumbar spine (lumbar peritoneal shunt) instead of the brain. If your symptoms are mild, your condition may be monitored on a regular basis before a decision is made about whether a shunt is needed. Follow these instructions at home: Medicines Take over-the-counter and prescription medicines only as told by your health care provider. Avoid taking medicines that can affect thinking, such as pain or sleeping medicines. Lifestyle Do not use any products that contain nicotine or tobacco. These products include cigarettes, chewing tobacco, and vaping devices, such as e-cigarettes. If you need help quitting, ask your health care provider. Drink enough fluid to keep your urine pale yellow. If you have a shunt: Do not wear tight-fitting hats or headgear. Before having an MRI or abdominal surgery, tell your health care provider that you have a shunt. Watch for signs of infection, such as: Fever. Redness, pain, or swelling of the skin along the shunt path. Headache or stiff neck. Nausea or vomiting. General instructions Work with your health care provider to determine what you need help with and what your safety needs are. Ask your health care provider when you can return to your normal activities. Keep all follow-up visits. This is important. Contact a health care provider if: You have any new symptoms. Your symptoms get worse. Your symptoms do   not improve after surgery. You lose coordination or balance. You or your family members become concerned for your safety. Get help right away if: You have: A fever or other signs of infection. New or  worsening confusion. New or worsening sleepiness. A hard time staying awake. A headache that is getting worse. Blurred vision, especially early in the morning. You start to twitch or shake (seizure). These symptoms may represent a serious problem that is an emergency. Do not wait to see if the symptoms will go away. Get medical help right away. Call your local emergency services (911 in the [place] Do not drive yourself to the hospital. Summary Normal-pressure hydrocephalus (NPH) is a buildup of fluid (cerebrospinal fluid, or CSF) inside the brain. When too much CSF is produced in the brain, it can affect brain function. Anything that blocks the flow of CSF can cause this condition. In some cases, the cause of this condition is not known. This condition may be treated with surgery in which a narrow tube (ventriculoperitoneal shunt, or VP shunt) is placed in the brain to drain excess CSF into another part of the body. This information is not intended to replace advice given to you by your health care provider. Make sure you discuss any questions you have with your health care provider. Document Revised: 04/03/2020 Document Reviewed: 04/03/2020 Elsevier Patient Education  2023 [employer]  
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
## 2022-08-29 ENCOUNTER — Other Ambulatory Visit: Payer: Self-pay | Admitting: Neurology

## 2022-08-29 ENCOUNTER — Telehealth: Payer: Self-pay | Admitting: Neurology

## 2022-08-29 DIAGNOSIS — R269 Unspecified abnormalities of gait and mobility: Secondary | ICD-10-CM

## 2022-08-29 DIAGNOSIS — G20C Parkinsonism, unspecified: Secondary | ICD-10-CM

## 2022-08-29 DIAGNOSIS — G3184 Mild cognitive impairment, so stated: Secondary | ICD-10-CM

## 2022-08-29 DIAGNOSIS — G912 (Idiopathic) normal pressure hydrocephalus: Secondary | ICD-10-CM

## 2022-08-29 NOTE — Telephone Encounter (Signed)
Pt called needing to speak to the RN regarding PT before and after a procedure that he is to have.

## 2022-08-29 NOTE — Telephone Encounter (Signed)
Called the patient back. The patient was scheduled through GI for the LP and the LP should be completed through [facility] so that they can have the rehab PT complete a NPH pre and post assessment. I have cancelled the pt schedule apt at GI and I have entered the order back in to be completed at [facility]

## 2022-09-10 ENCOUNTER — Other Ambulatory Visit: Payer: Medicare Other

## 2022-09-13 ENCOUNTER — Encounter: Payer: Self-pay | Admitting: Cardiology

## 2022-09-13 ENCOUNTER — Ambulatory Visit: Payer: Medicare Other | Admitting: Cardiology

## 2022-09-13 VITALS — BP 135/81 | HR 73 | Resp 16 | Ht 70.5 in | Wt 179.0 lb

## 2022-09-13 DIAGNOSIS — I428 Other cardiomyopathies: Secondary | ICD-10-CM | POA: Diagnosis not present

## 2022-09-13 DIAGNOSIS — E782 Mixed hyperlipidemia: Secondary | ICD-10-CM

## 2022-09-13 DIAGNOSIS — I10 Essential (primary) hypertension: Secondary | ICD-10-CM | POA: Diagnosis not present

## 2022-09-13 DIAGNOSIS — R931 Abnormal findings on diagnostic imaging of heart and coronary circulation: Secondary | ICD-10-CM | POA: Diagnosis not present

## 2022-09-13 MED ORDER — ATORVASTATIN CALCIUM 80 MG PO TABS: 80.0000 mg | ORAL_TABLET | Freq: Every day | ORAL | 3 refills | Status: DC

## 2022-09-13 NOTE — Progress Notes (Signed)
Patient is here for follow up visit.  Subjective:   Tyler Hayes, male    DOB: 04-29-1946, 77 y.o.   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 Blood pressure is well controlled. No cardiac symptoms today. He is now on donepezil for treatment of dementia. Reviewed recent test results with the patient, details below.     Current Outpatient Medications:    Abatacept (ORENCIA IV), Inject into the vein. Gets every 4 weeks for awhile and then goes to monthly infusions, Disp: , Rfl:    albuterol (VENTOLIN HFA) 108 (90 Base) MCG/ACT inhaler, Inhale into the lungs., Disp: , Rfl:    amLODipine (NORVASC) 2.5 MG tablet, Take 2.5 mg by mouth daily., Disp: , Rfl:    aspirin 81 MG chewable tablet, 1 tablet, Disp: , Rfl:    atorvastatin (LIPITOR) 40 MG tablet, Take 40 mg by mouth daily., Disp: , Rfl:    carbidopa-levodopa (SINEMET IR) 25-100 MG tablet, TAKE 1 TABLET 3 TIMES A DAYWITH WATER 30 MINUTES      BEFORE MEALS, Disp: 270 tablet, Rfl: 3   carvedilol (COREG) 6.25 MG tablet, Take 1 tablet (6.25 mg total) by mouth 2 (two) times daily., Disp: 180 tablet, Rfl: 3   Cholecalciferol 50 MCG (2000 UT) TABS, Take 2,000 Units by mouth daily., Disp: , Rfl:    clonazePAM (KLONOPIN) 0.5 MG tablet, At bedtime prn, use 1/2 tab and if needed add second half., Disp: 30 tablet, Rfl: 5   donepezil (ARICEPT) 5 MG tablet, Take 1 tablet (5 mg total) by mouth 2 (two) times daily., Disp: 60 tablet, Rfl: 5   fluticasone (FLONASE) 50 MCG/ACT nasal spray, Place 1 spray into both nostrils daily as needed for rhinitis (drainage issues.). , Disp: , Rfl:    KLOR-CON M10 10 MEQ tablet,  Take 10 mEq by mouth daily., Disp: , Rfl:    leflunomide (ARAVA) 20 MG tablet, Take 20 mg by mouth daily., Disp: , Rfl:    losartan (COZAAR) 100 MG tablet, Take 100 mg by mouth at bedtime., Disp: , Rfl:    montelukast (SINGULAIR) 10 MG tablet, Take 10 mg by mouth every evening., Disp: , Rfl:    SYNJARDY XR 09-998 MG TB24, Take 2 tablets by mouth daily. , Disp: , Rfl:     Cardiovascular studies:  EKG 09/13/2022: Sinus rhythm 61 bpm  Left bundle branch block  Echocardiogram 09/27/2020:  Normal LV systolic function with visual EF 60-65%. Left ventricle cavity  is normal in size. Normal global wall motion. Indeterminate diastolic  filling pattern, normal LAP. Calculated EF 69%.  No significant valvular heart disease.  Findings to suggest non-compacted myocardium. Clinical correlation is  required.  Compared to study dated 05/04/2018 no significant change.    Lexiscan Tetrofosmin Stress Test  09/04/2020: Nondiagnostic ECG stress. Underlying LBBB. Myocardial perfusion is normal with mild diaphragmatic attenuation. LV is mildly dilated both in rest and stress images. Overall LV systolic function is abnormal with septal motion consistent with LBBB. Stress LV EF: 34%. No previous exam available for comparison. High risk study due to low LVEF. Correlate LVEF with echocardiogram.  CT cardiac scoring 08/14/2020: Total score  443 (50-75th percentile) LM: 33 LAD: 9 LCx: 70 RCA: 340 PDA: 0  Abdominal Aortic Duplex 04/18/2020:  The maximum aorta (sac) diameter is 2.34 cm (mid). There is mild ectasia  in the mid abdominal aorta.  Moderate diffuse calcific plaque observed throughout the abdominal aorta.  Normal flow velocities noted in the aorta and bilateral common iliac  arteries.  No AAA noted. Consider repeat scan in 5-10 years for stability of ectasia.  Coronary angiography 05/26/2018: LM: Normal LAD: Normal LCx: Normal Ramus intermedius: Normal RCA: Mid focal 20% disease LVEDP  normal   Conclusion: Minimal nonobstructive coronary artery disease Normal LVEDP  Cardiac MRI 06/18/2018: 1. Mild LVE with diffuse hypokinesis and abnormal septal motion EF 42%  2. Mild ventricular non compaction ratio using posterior wall thickness 2:1 3.  Normal RV size and function   Recent labs: 07/11/2022: Glucose 95, BUN/Cr 10/1.16. EGFR 66. Na/K 143/4.2. Rest of the CMP normal H/H 13/41. MCV 91. Platelets 199  10/2021: Chol 148, TG 55, HDL 54, LDL 82   Review of Systems  Cardiovascular:  Negative for chest pain, dyspnea on exertion, leg swelling, palpitations and syncope.       Objective:    Vitals:   09/13/22 0932 09/13/22 0936  BP: (!) 148/81 135/81  Pulse: 82 73  Resp: 16   SpO2: 96%      Physical Exam Vitals and nursing note reviewed.  Constitutional:      General: He is not in acute distress. Neck:     Vascular: No JVD.  Cardiovascular:     Rate and Rhythm: Normal rate and regular rhythm.     Heart sounds: Normal heart sounds. No murmur heard. Pulmonary:     Effort: Pulmonary effort is normal.     Breath sounds: Normal breath sounds. No wheezing or rales.  Musculoskeletal:     Right lower leg: No edema.     Left lower leg: No edema.       ICD-10-CM   1. Essential hypertension  I10     2. Elevated coronary artery calcium score  R93.1 EKG 12-Lead    3. Nonischemic cardiomyopathy (HCC)  I42.8      No orders of the defined types were placed in this encounter.      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  Elevated coronary calcium score: Total score of 443, including 33 in left main. No anginal symptoms at this time. Stress test with no ischemia (09/2020).  Negative stress LVEF, similar to previous study in  2020. Coronary angiogram in 2020 with no significant CAD. It is possible that he is developed more coronary calcium deposition since then. Continue aspirin 81 mg daily. Chol 148, TG 55, HDL 54, LDL 82 (10/2021). Increase Lipitor to 80 mg daily. Repeat lipid panel in 3 months.  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 EF 42% without any heart failure signs/symptoms. Reasonable not to use anticoagulation.   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 Repeat ultrasound in 2030 Continue statin  Type 2 DM: Continue current therapy. Managed by PCP.  F/u in 1 year    Elder Negus, MD Pager: [phone] Office: [phone]

## 2022-09-19 IMAGING — DX DG HIP (WITH OR WITHOUT PELVIS) 2-3V*L*
3 series · 3 of 3 positions shown · non-contrast
Comparison: None.

CLINICAL DATA: Lumbar degenerative disc disease. Low back pain
radiating into left leg for 1 month.

EXAM:
DG HIP (WITH OR WITHOUT PELVIS) 2-3V LEFT

[pelvis ap]
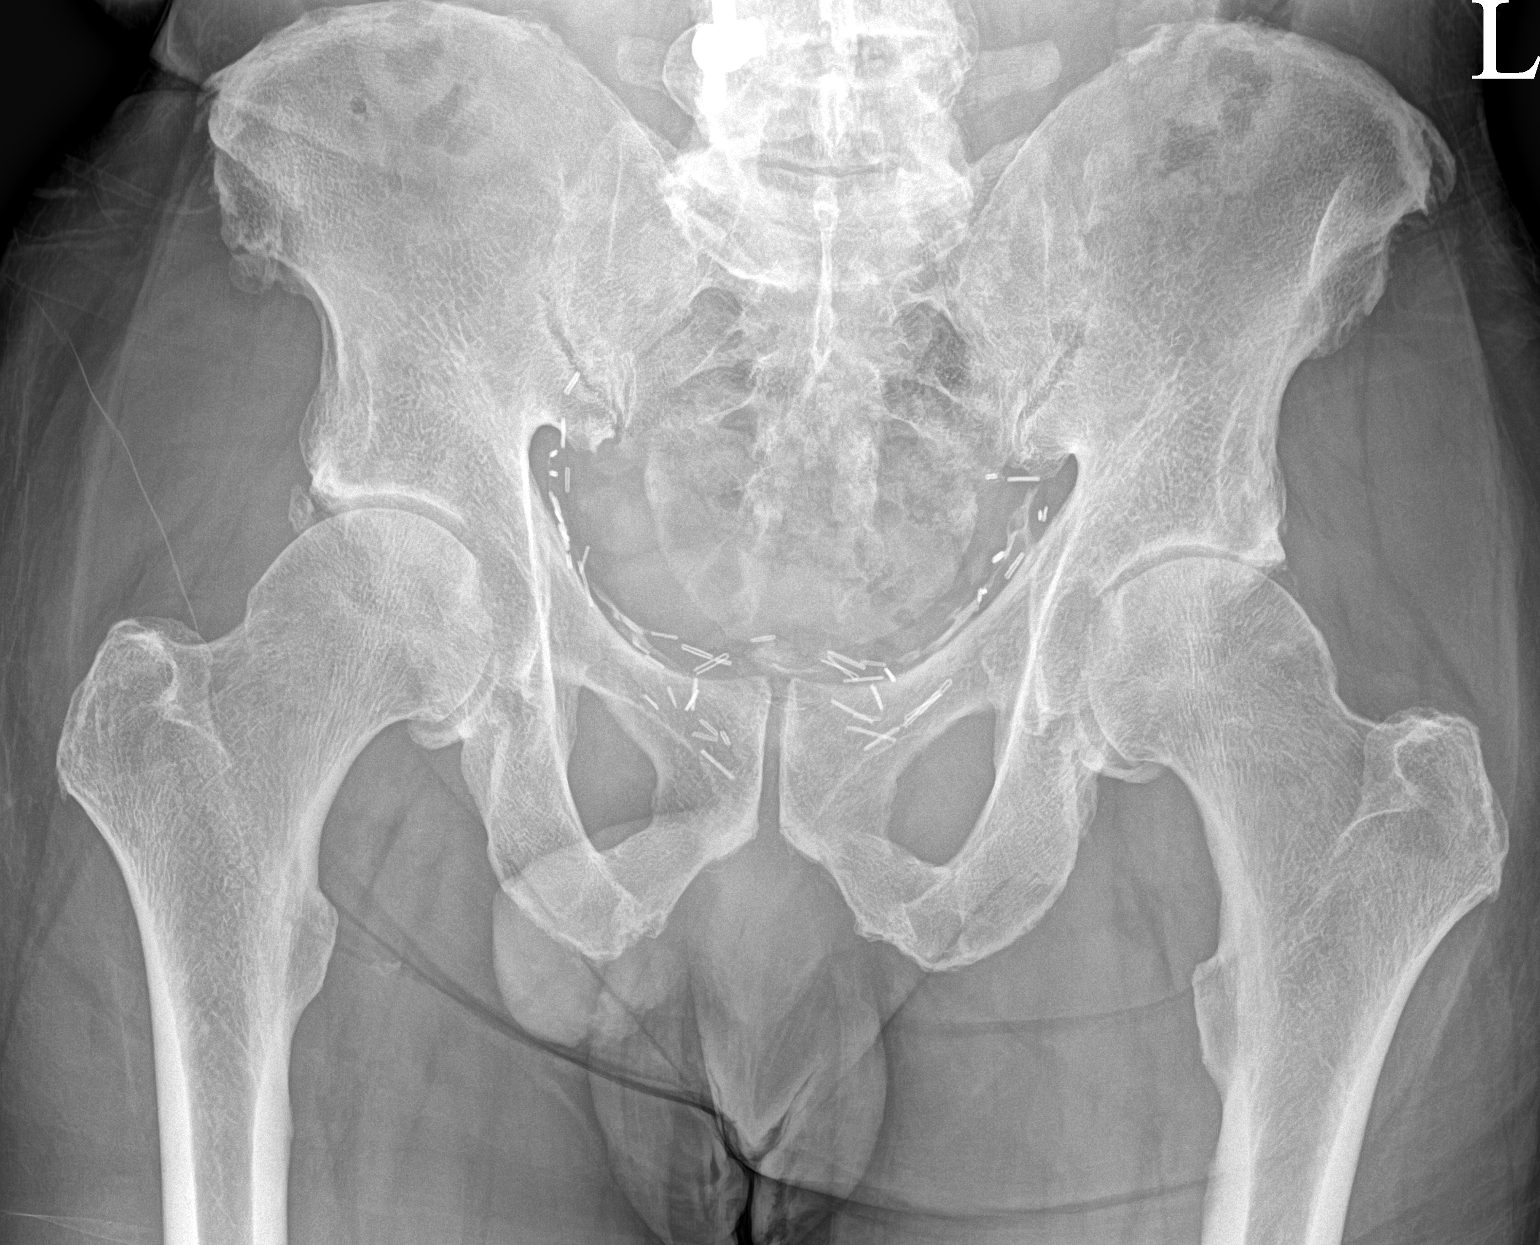

[hip ap]
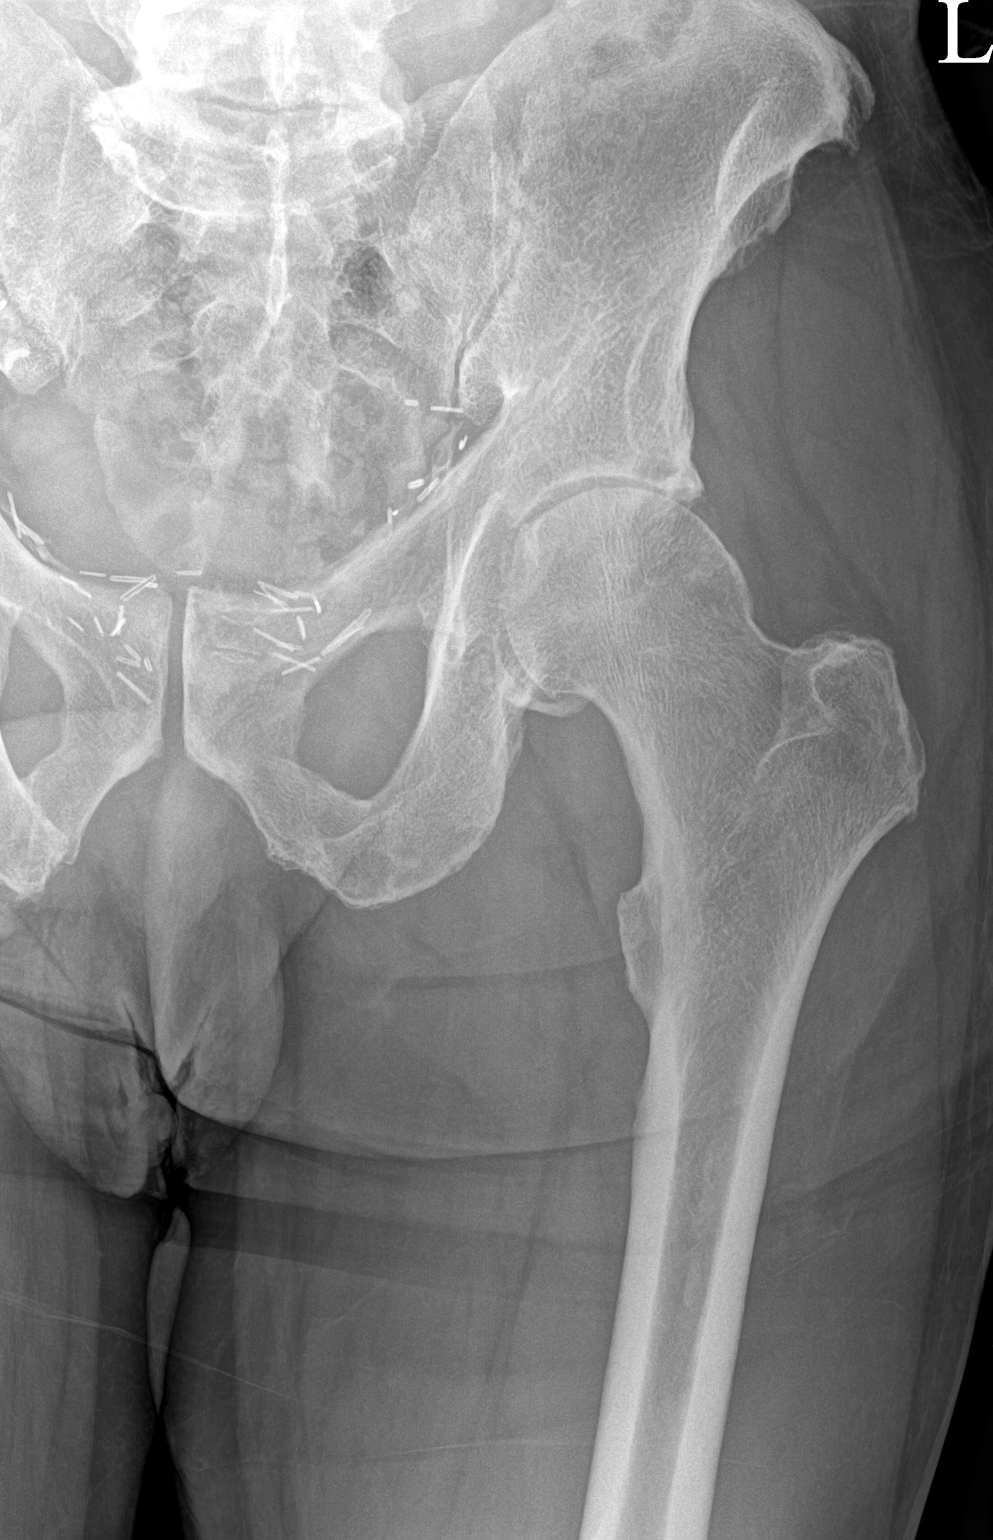

[hip frog leg]
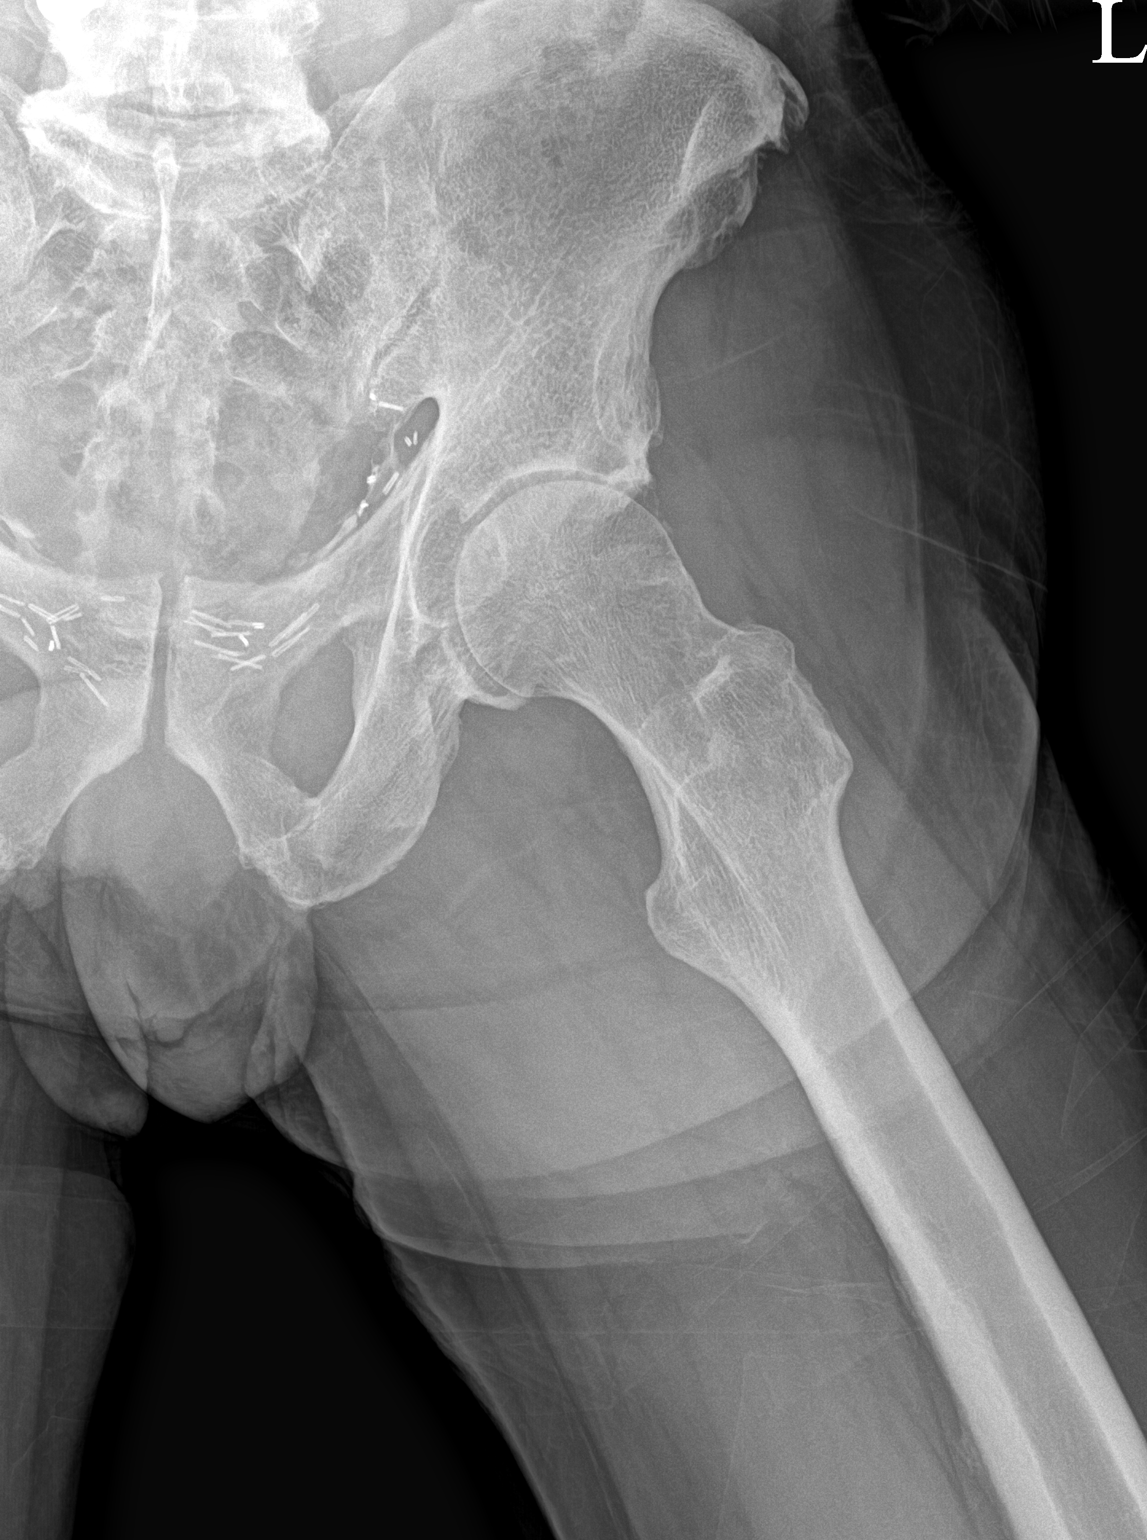

[3 of 3 positions shown; findings below may reference images not displayed]

FINDINGS: Moderate left hip joint space narrowing. Lateral acetabular spurring
with subchondral cystic change of the lateral acetabulum. No
evidence of fracture, avascular necrosis, erosion or bone
destruction. Pubic symphysis and sacroiliac joints are congruent.
There is symmetric bilateral sacroiliac joint degenerative change.
Surgical clips in the pelvis. No visualized focal bone lesion or
bone destruction.
IMPRESSION: 1. Moderate left hip osteoarthritis.
2. Bilateral sacroiliac joint degenerative change.

## 2022-09-23 DIAGNOSIS — M0609 Rheumatoid arthritis without rheumatoid factor, multiple sites: Secondary | ICD-10-CM | POA: Diagnosis not present

## 2022-09-26 ENCOUNTER — Ambulatory Visit: Payer: Medicare Other | Admitting: Neurology

## 2022-09-30 ENCOUNTER — Encounter: Payer: Self-pay | Admitting: Podiatry

## 2022-09-30 ENCOUNTER — Ambulatory Visit (INDEPENDENT_AMBULATORY_CARE_PROVIDER_SITE_OTHER): Payer: Medicare Other | Admitting: Podiatry

## 2022-09-30 DIAGNOSIS — M79675 Pain in left toe(s): Secondary | ICD-10-CM

## 2022-09-30 DIAGNOSIS — M79674 Pain in right toe(s): Secondary | ICD-10-CM | POA: Diagnosis not present

## 2022-09-30 DIAGNOSIS — B351 Tinea unguium: Secondary | ICD-10-CM

## 2022-09-30 DIAGNOSIS — N1831 Chronic kidney disease, stage 3a: Secondary | ICD-10-CM

## 2022-09-30 NOTE — Progress Notes (Signed)
This patient presents to the office with chief complaint of long thick painful nails.  Patient says the nails are painful walking and wearing shoes.  This patient is unable to self treat.  This patient is unable to trim his nails since he is unable to reach his nails.  She presents to the office for preventative foot care services.  General Appearance  Alert, conversant and in no acute stress.  Vascular  Dorsalis pedis and posterior tibial  pulses are palpable  bilaterally.  Capillary return is within normal limits  bilaterally. Temperature is within normal limits  bilaterally.  Neurologic  Senn-Weinstein monofilament wire test within normal limits  bilaterally. Muscle power within normal limits bilaterally.  Nails Thick disfigured discolored nails with subungual debris  from hallux to fifth toes bilaterally. No evidence of bacterial infection or drainage bilaterally.  Orthopedic  No limitations of motion  feet .  No crepitus or effusions noted.  No bony pathology or digital deformities noted.  Skin  normotropic skin with no porokeratosis noted bilaterally.  No signs of infections or ulcers noted.     Onychomycosis  Nails  B/L.  Pain in right toes  Pain in left toes  Debridement of nails both feet followed trimming the nails with dremel tool.    RTC 3 1/2  months.   Helane Gunther DPM

## 2022-10-02 NOTE — Telephone Encounter (Signed)
Order has been re entered to complete the LP through [facility]. The patient must have a PT assessment completed through rehab department prior to completing the LP at [facility] and then immediately post LP. Assessing if there is NPH present with the patient.

## 2022-10-02 NOTE — Telephone Encounter (Signed)
Pt wife called. Stated she is waiting on someone to call her to schedule pt LB at [facility]. She is requesting a call from nurse to discuss.

## 2022-10-02 NOTE — Addendum Note (Signed)
Addended by: Judi Cong on: 10/02/2022 12:18 PM   Modules accepted: Orders

## 2022-10-03 NOTE — Telephone Encounter (Signed)
Called pt wife. Informed her of message nurse [facility] sent. She stated I appreciate the follow-up.

## 2022-10-06 ENCOUNTER — Observation Stay (HOSPITAL_COMMUNITY)
Admission: EM | Admit: 2022-10-06 | Discharge: 2022-10-08 | Disposition: A | Discharge status: Home / Self Care | Payer: Medicare Other | Attending: Internal Medicine | Admitting: Internal Medicine

## 2022-10-06 DIAGNOSIS — N1831 Chronic kidney disease, stage 3a: Secondary | ICD-10-CM | POA: Diagnosis present

## 2022-10-06 DIAGNOSIS — Z8546 Personal history of malignant neoplasm of prostate: Secondary | ICD-10-CM | POA: Insufficient documentation

## 2022-10-06 DIAGNOSIS — Z79899 Other long term (current) drug therapy: Secondary | ICD-10-CM | POA: Diagnosis not present

## 2022-10-06 DIAGNOSIS — Z1152 Encounter for screening for COVID-19: Secondary | ICD-10-CM | POA: Diagnosis not present

## 2022-10-06 DIAGNOSIS — Z87891 Personal history of nicotine dependence: Secondary | ICD-10-CM | POA: Diagnosis not present

## 2022-10-06 DIAGNOSIS — J189 Pneumonia, unspecified organism: Principal | ICD-10-CM | POA: Insufficient documentation

## 2022-10-06 DIAGNOSIS — R0602 Shortness of breath: Secondary | ICD-10-CM | POA: Diagnosis not present

## 2022-10-06 DIAGNOSIS — I129 Hypertensive chronic kidney disease with stage 1 through stage 4 chronic kidney disease, or unspecified chronic kidney disease: Secondary | ICD-10-CM | POA: Insufficient documentation

## 2022-10-06 DIAGNOSIS — Z7982 Long term (current) use of aspirin: Secondary | ICD-10-CM | POA: Diagnosis not present

## 2022-10-06 DIAGNOSIS — R531 Weakness: Secondary | ICD-10-CM | POA: Diagnosis not present

## 2022-10-06 DIAGNOSIS — I10 Essential (primary) hypertension: Secondary | ICD-10-CM | POA: Diagnosis present

## 2022-10-06 DIAGNOSIS — G20C Parkinsonism, unspecified: Secondary | ICD-10-CM | POA: Insufficient documentation

## 2022-10-06 DIAGNOSIS — I428 Other cardiomyopathies: Secondary | ICD-10-CM

## 2022-10-06 DIAGNOSIS — R0902 Hypoxemia: Secondary | ICD-10-CM | POA: Diagnosis not present

## 2022-10-06 DIAGNOSIS — E1122 Type 2 diabetes mellitus with diabetic chronic kidney disease: Secondary | ICD-10-CM | POA: Insufficient documentation

## 2022-10-06 DIAGNOSIS — I2510 Atherosclerotic heart disease of native coronary artery without angina pectoris: Secondary | ICD-10-CM | POA: Insufficient documentation

## 2022-10-06 DIAGNOSIS — M1990 Unspecified osteoarthritis, unspecified site: Secondary | ICD-10-CM | POA: Diagnosis present

## 2022-10-06 DIAGNOSIS — E119 Type 2 diabetes mellitus without complications: Secondary | ICD-10-CM

## 2022-10-06 DIAGNOSIS — G3184 Mild cognitive impairment, so stated: Secondary | ICD-10-CM | POA: Diagnosis present

## 2022-10-06 NOTE — ED Triage Notes (Signed)
Pt presents from fall four days ago but has been getting gradually weaker. Per EMS generalized weakness but LVO of 0. BG 111. PMH of parkinson's.

## 2022-10-07 ENCOUNTER — Encounter (HOSPITAL_COMMUNITY): Payer: Self-pay

## 2022-10-07 ENCOUNTER — Emergency Department (HOSPITAL_COMMUNITY): Payer: Medicare Other

## 2022-10-07 ENCOUNTER — Observation Stay (HOSPITAL_COMMUNITY): Payer: Medicare Other

## 2022-10-07 ENCOUNTER — Other Ambulatory Visit: Payer: Self-pay

## 2022-10-07 DIAGNOSIS — R0602 Shortness of breath: Secondary | ICD-10-CM | POA: Diagnosis not present

## 2022-10-07 DIAGNOSIS — M1990 Unspecified osteoarthritis, unspecified site: Secondary | ICD-10-CM | POA: Diagnosis not present

## 2022-10-07 DIAGNOSIS — I2510 Atherosclerotic heart disease of native coronary artery without angina pectoris: Secondary | ICD-10-CM | POA: Diagnosis not present

## 2022-10-07 DIAGNOSIS — G20A1 Parkinson's disease without dyskinesia, without mention of fluctuations: Secondary | ICD-10-CM | POA: Diagnosis not present

## 2022-10-07 DIAGNOSIS — R29818 Other symptoms and signs involving the nervous system: Secondary | ICD-10-CM | POA: Diagnosis not present

## 2022-10-07 DIAGNOSIS — I10 Essential (primary) hypertension: Secondary | ICD-10-CM | POA: Diagnosis not present

## 2022-10-07 DIAGNOSIS — J189 Pneumonia, unspecified organism: Secondary | ICD-10-CM | POA: Diagnosis present

## 2022-10-07 DIAGNOSIS — G3184 Mild cognitive impairment, so stated: Secondary | ICD-10-CM | POA: Diagnosis not present

## 2022-10-07 DIAGNOSIS — R531 Weakness: Secondary | ICD-10-CM | POA: Diagnosis not present

## 2022-10-07 DIAGNOSIS — E119 Type 2 diabetes mellitus without complications: Secondary | ICD-10-CM

## 2022-10-07 LAB — COMPREHENSIVE METABOLIC PANEL
ALT: 15 U/L (ref 0–44)
ALT: 13 U/L (ref 0–44)
AST: 29 U/L (ref 15–41)
AST: 18 U/L (ref 15–41)
Albumin: 3.1 g/dL — ABNORMAL LOW (ref 3.5–5.0)
Albumin: 2.8 g/dL — ABNORMAL LOW (ref 3.5–5.0)
Alkaline Phosphatase: 44 U/L (ref 38–126)
Alkaline Phosphatase: 40 U/L (ref 38–126)
Anion gap: 13 (ref 5–15)
Anion gap: 14 (ref 5–15)
BUN: 13 mg/dL (ref 8–23)
BUN: 11 mg/dL (ref 8–23)
CO2: 23 mmol/L (ref 22–32)
CO2: 19 mmol/L — ABNORMAL LOW (ref 22–32)
Calcium: 8.9 mg/dL (ref 8.9–10.3)
Calcium: 8.6 mg/dL — ABNORMAL LOW (ref 8.9–10.3)
Chloride: 98 mmol/L (ref 98–111)
Chloride: 102 mmol/L (ref 98–111)
Creatinine, Ser: 1.22 mg/dL (ref 0.61–1.24)
Creatinine, Ser: 1.12 mg/dL (ref 0.61–1.24)
GFR, Estimated: >60 mL/min (ref >60)
GFR, Estimated: >60 mL/min (ref >60)
Glucose, Bld: 90 mg/dL (ref 70–99)
Glucose, Bld: 85 mg/dL (ref 70–99)
Potassium: 3.9 mmol/L (ref 3.5–5.1)
Potassium: 3.2 mmol/L — ABNORMAL LOW (ref 3.5–5.1)
Sodium: 134 mmol/L — ABNORMAL LOW (ref 135–145)
Sodium: 135 mmol/L (ref 135–145)
Total Bilirubin: 2 mg/dL — ABNORMAL HIGH (ref 0.3–1.2)
Total Bilirubin: 1.4 mg/dL — ABNORMAL HIGH (ref 0.3–1.2)
Total Protein: 6.5 g/dL (ref 6.5–8.1)
Total Protein: 6.2 g/dL — ABNORMAL LOW (ref 6.5–8.1)

## 2022-10-07 LAB — CBC WITH DIFFERENTIAL/PLATELET
Abs Immature Granulocytes: 0.03 10*3/uL (ref 0.00–0.07)
Basophils Absolute: 0 10*3/uL (ref 0.0–0.1)
Basophils Relative: 0 %
Eosinophils Absolute: 0 10*3/uL (ref 0.0–0.5)
Eosinophils Relative: 0 %
HCT: 35.2 % — ABNORMAL LOW (ref 39.0–52.0)
Hemoglobin: 11.3 g/dL — ABNORMAL LOW (ref 13.0–17.0)
Immature Granulocytes: 0 %
Lymphocytes Relative: 7 %
Lymphs Abs: 0.5 10*3/uL — ABNORMAL LOW (ref 0.7–4.0)
MCH: 29.9 pg (ref 26.0–34.0)
MCHC: 32.1 g/dL (ref 30.0–36.0)
MCV: 93.1 fL (ref 80.0–100.0)
Monocytes Absolute: 1.2 10*3/uL — ABNORMAL HIGH (ref 0.1–1.0)
Monocytes Relative: 17 %
Neutro Abs: 5.5 10*3/uL (ref 1.7–7.7)
Neutrophils Relative %: 76 %
Platelets: 175 10*3/uL (ref 150–400)
RBC: 3.78 MIL/uL — ABNORMAL LOW (ref 4.22–5.81)
RDW: 12.4 % (ref 11.5–15.5)
WBC: 7.4 10*3/uL (ref 4.0–10.5)
nRBC: 0 % (ref 0.0–0.2)

## 2022-10-07 LAB — GLUCOSE, CAPILLARY
Glucose-Capillary: 99 mg/dL (ref 70–99)
Glucose-Capillary: 86 mg/dL (ref 70–99)
Glucose-Capillary: 91 mg/dL (ref 70–99)
Glucose-Capillary: 85 mg/dL (ref 70–99)

## 2022-10-07 LAB — CK: Total CK: 125 U/L (ref 49–397)

## 2022-10-07 LAB — CBC
HCT: 34 % — ABNORMAL LOW (ref 39.0–52.0)
Hemoglobin: 10.8 g/dL — ABNORMAL LOW (ref 13.0–17.0)
MCH: 29.8 pg (ref 26.0–34.0)
MCHC: 31.8 g/dL (ref 30.0–36.0)
MCV: 93.7 fL (ref 80.0–100.0)
Platelets: 161 10*3/uL (ref 150–400)
RBC: 3.63 MIL/uL — ABNORMAL LOW (ref 4.22–5.81)
RDW: 12.4 % (ref 11.5–15.5)
WBC: 6.9 10*3/uL (ref 4.0–10.5)
nRBC: 0 % (ref 0.0–0.2)

## 2022-10-07 LAB — URINALYSIS, ROUTINE W REFLEX MICROSCOPIC
Bacteria, UA: NONE SEEN
Bacteria, UA: NONE SEEN
Bilirubin Urine: NEGATIVE
Bilirubin Urine: NEGATIVE
Glucose, UA: >=500 mg/dL — AB
Glucose, UA: >=500 mg/dL — AB
Hgb urine dipstick: NEGATIVE
Ketones, ur: 20 mg/dL — AB
Ketones, ur: 20 mg/dL — AB
Leukocytes,Ua: NEGATIVE
Leukocytes,Ua: NEGATIVE
Nitrite: NEGATIVE
Nitrite: NEGATIVE
Protein, ur: 30 mg/dL — AB
Protein, ur: 30 mg/dL — AB
Specific Gravity, Urine: 1.016 (ref 1.005–1.030)
Specific Gravity, Urine: 1.021 (ref 1.005–1.030)
pH: 5 (ref 5.0–8.0)
pH: 5 (ref 5.0–8.0)

## 2022-10-07 LAB — RESPIRATORY PANEL BY PCR

## 2022-10-07 LAB — SARS CORONAVIRUS 2 BY RT PCR: SARS Coronavirus 2 by RT PCR: NEGATIVE

## 2022-10-07 LAB — LACTIC ACID, PLASMA: Lactic Acid, Venous: 1.5 mmol/L (ref 0.5–1.9)

## 2022-10-07 LAB — PROCALCITONIN: Procalcitonin: 0.32 ng/mL

## 2022-10-07 LAB — PHOSPHORUS: Phosphorus: 2.9 mg/dL (ref 2.5–4.6)

## 2022-10-07 LAB — MAGNESIUM: Magnesium: 1.6 mg/dL — ABNORMAL LOW (ref 1.7–2.4)

## 2022-10-07 LAB — STREP PNEUMONIAE URINARY ANTIGEN: Strep Pneumo Urinary Antigen: NEGATIVE

## 2022-10-07 MED ORDER — ACETAMINOPHEN 650 MG RE SUPP: 650.0000 mg | Freq: Four times a day (QID) | RECTAL | Status: DC | PRN

## 2022-10-07 MED ORDER — SODIUM CHLORIDE 0.9 % IV SOLN
500.0000 mg | Freq: Once | INTRAVENOUS | Status: AC
  Administered 2022-10-07: 500 mg via INTRAVENOUS
  Filled 2022-10-07: qty 5

## 2022-10-07 MED ORDER — CARBIDOPA-LEVODOPA 25-100 MG PO TABS
1.0000 | ORAL_TABLET | Freq: Three times a day (TID) | ORAL | Status: DC
  Administered 2022-10-07 – 2022-10-08 (×5): 1 via ORAL
  Filled 2022-10-07 (×5): qty 1

## 2022-10-07 MED ORDER — SENNOSIDES-DOCUSATE SODIUM 8.6-50 MG PO TABS: 1.0000 | ORAL_TABLET | Freq: Every evening | ORAL | Status: DC | PRN

## 2022-10-07 MED ORDER — LOSARTAN POTASSIUM 50 MG PO TABS
100.0000 mg | ORAL_TABLET | Freq: Every day | ORAL | Status: DC
  Administered 2022-10-07: 100 mg via ORAL
  Filled 2022-10-07: qty 2

## 2022-10-07 MED ORDER — CLONAZEPAM 0.5 MG PO TABS
0.5000 mg | ORAL_TABLET | Freq: Every evening | ORAL | Status: DC | PRN
  Administered 2022-10-07: 0.5 mg via ORAL
  Filled 2022-10-07: qty 1

## 2022-10-07 MED ORDER — PROCHLORPERAZINE EDISYLATE 10 MG/2ML IJ SOLN: 5.0000 mg | Freq: Four times a day (QID) | INTRAMUSCULAR | Status: DC | PRN

## 2022-10-07 MED ORDER — ATORVASTATIN CALCIUM 80 MG PO TABS
80.0000 mg | ORAL_TABLET | Freq: Every day | ORAL | Status: DC
  Administered 2022-10-07 – 2022-10-08 (×2): 80 mg via ORAL
  Filled 2022-10-07 (×2): qty 1

## 2022-10-07 MED ORDER — ACETAMINOPHEN 325 MG PO TABS
650.0000 mg | ORAL_TABLET | Freq: Four times a day (QID) | ORAL | Status: DC | PRN
  Administered 2022-10-07: 650 mg via ORAL
  Filled 2022-10-07: qty 2

## 2022-10-07 MED ORDER — POTASSIUM CHLORIDE CRYS ER 20 MEQ PO TBCR
80.0000 meq | EXTENDED_RELEASE_TABLET | Freq: Once | ORAL | Status: AC
  Administered 2022-10-07: 80 meq via ORAL
  Filled 2022-10-07: qty 4

## 2022-10-07 MED ORDER — SODIUM CHLORIDE 0.9 % IV SOLN
2.0000 g | INTRAVENOUS | Status: DC
  Administered 2022-10-08: 2 g via INTRAVENOUS
  Filled 2022-10-07: qty 20

## 2022-10-07 MED ORDER — MAGNESIUM SULFATE 2 GM/50ML IV SOLN
2.0000 g | Freq: Once | INTRAVENOUS | Status: AC
  Administered 2022-10-07: 2 g via INTRAVENOUS
  Filled 2022-10-07: qty 50

## 2022-10-07 MED ORDER — SODIUM CHLORIDE 0.9 % IV SOLN
1.0000 g | Freq: Once | INTRAVENOUS | Status: AC
  Administered 2022-10-07: 1 g via INTRAVENOUS
  Filled 2022-10-07: qty 10

## 2022-10-07 MED ORDER — SODIUM CHLORIDE 0.9 % IV BOLUS
500.0000 mL | Freq: Once | INTRAVENOUS | Status: AC
  Administered 2022-10-07: 500 mL via INTRAVENOUS

## 2022-10-07 MED ORDER — ASPIRIN 81 MG PO CHEW
81.0000 mg | CHEWABLE_TABLET | Freq: Every day | ORAL | Status: DC
  Administered 2022-10-07 – 2022-10-08 (×2): 81 mg via ORAL
  Filled 2022-10-07 (×2): qty 1

## 2022-10-07 MED ORDER — AZITHROMYCIN 500 MG PO TABS
500.0000 mg | ORAL_TABLET | Freq: Every day | ORAL | Status: DC
  Administered 2022-10-08: 500 mg via ORAL
  Filled 2022-10-07: qty 1

## 2022-10-07 MED ORDER — ORAL CARE MOUTH RINSE: 15.0000 mL | OROMUCOSAL | Status: DC | PRN

## 2022-10-07 MED ORDER — ENOXAPARIN SODIUM 40 MG/0.4ML IJ SOSY
40.0000 mg | PREFILLED_SYRINGE | Freq: Every day | INTRAMUSCULAR | Status: DC
  Administered 2022-10-07 – 2022-10-08 (×2): 40 mg via SUBCUTANEOUS
  Filled 2022-10-07 (×2): qty 0.4

## 2022-10-07 MED ORDER — DONEPEZIL HCL 5 MG PO TABS
5.0000 mg | ORAL_TABLET | Freq: Two times a day (BID) | ORAL | Status: DC
  Administered 2022-10-07 – 2022-10-08 (×2): 5 mg via ORAL
  Filled 2022-10-07 (×3): qty 1

## 2022-10-07 MED ORDER — CARVEDILOL 6.25 MG PO TABS
6.2500 mg | ORAL_TABLET | Freq: Two times a day (BID) | ORAL | Status: DC
  Administered 2022-10-07 – 2022-10-08 (×3): 6.25 mg via ORAL
  Filled 2022-10-07 (×3): qty 1

## 2022-10-07 MED ORDER — AMLODIPINE BESYLATE 5 MG PO TABS
2.5000 mg | ORAL_TABLET | Freq: Every day | ORAL | Status: DC
  Administered 2022-10-07 – 2022-10-08 (×2): 2.5 mg via ORAL
  Filled 2022-10-07 (×2): qty 1

## 2022-10-07 NOTE — Progress Notes (Signed)
New Admission Note:  Arrival Method: Stretcher Mental Orientation: Alert and oriented x 4 Telemetry: N/A Assessment: Completed Skin: Warm and dry IV: NSL Pain: Denies Tubes: N/A Safety Measures: Safety Fall Prevention Plan initiated.  Admission: Completed 5 M  Orientation: Patient has been orientated to the room, unit and the staff. Welcome booklet given.  Family: N/A  Orders have been reviewed and implemented. Will continue to monitor the patient. Call light has been placed within reach and bed alarm has been activated.   Jenicka Coxe BSN, RN  Phone Number: [phone] 

## 2022-10-07 NOTE — Plan of Care (Signed)
°  Problem: Activity: °Goal: Ability to tolerate increased activity will improve °Outcome: Completed/Met °  °

## 2022-10-07 NOTE — Evaluation (Signed)
Physical Therapy Evaluation Patient Details Name: Tyler Hayes MRN: [id number] DOB: 1946-04-18 Today's Date: 10/07/2022  History of Present Illness  77 y.o. male presents to [facility] hospital on 10/06/2022 with generalized weakness and recent fall.  Chest xray concerning for RLL PNA. Pending MRI brain and spine. PMH includes Parkinson's disease, mild cognitive impairment, HTN, HLD.  Clinical Impression  Pt presents to PT with deficits in strength, power, endurance, balance, gait. Pt is able to transfer and ambulate without physical assistance at this time. Pt ambulates with and without DME, without LOB noted during session. PT provides cues for hand placement to improve transfer technique from lower surfaces. Pt will benefit from continued frequent mobilization in an effort to improve strength, power and endurance. PT recommends discharge home with outpatient PT services.       Recommendations for follow up therapy are one component of a multi-disciplinary discharge planning process, led by the attending physician.  Recommendations may be updated based on patient status, additional functional criteria and insurance authorization.  Follow Up Recommendations       Assistance Recommended at Discharge PRN  Patient can return home with the following  A little help with bathing/dressing/bathroom;Assistance with cooking/housework;Assist for transportation;Help with stairs or ramp for entrance    Equipment Recommendations None recommended by PT  Recommendations for Other Services       Functional Status Assessment Patient has had a recent decline in their functional status and demonstrates the ability to make significant improvements in function in a reasonable and predictable amount of time.     Precautions / Restrictions Precautions Precautions: Fall Precaution Comments: Parkinson's disease Restrictions Weight Bearing Restrictions: No      Mobility  Bed Mobility Overal bed mobility:  Modified Independent Bed Mobility: Supine to Sit, Sit to Supine     Supine to sit: Modified independent (Device/Increase time) Sit to supine: Modified independent (Device/Increase time)        Transfers Overall transfer level: Needs assistance Equipment used: Rolling walker (2 wheels) Transfers: Sit to/from Stand Sit to Stand: Supervision           General transfer comment: verbal cues for hand placement    Ambulation/Gait Ambulation/Gait assistance: Supervision Gait Distance (Feet): 150 Feet Assistive device: Rolling walker (2 wheels), None Gait Pattern/deviations: Step-through pattern Gait velocity: functional Gait velocity interpretation: 1.31 - 2.62 ft/sec, indicative of limited community ambulator   General Gait Details: pt ambulates for ~60' with RW, then ambulates ~90' without DME. Pt demonstrates widened BOS without UE support, stride length remains consistent. No significant balance deviations noted  Stairs            Wheelchair Mobility    Modified Rankin (Stroke Patients Only)       Balance Overall balance assessment: Needs assistance Sitting-balance support: No upper extremity supported, Feet supported Sitting balance-Leahy Scale: Good     Standing balance support: No upper extremity supported, During functional activity Standing balance-Leahy Scale: Fair                               Pertinent Vitals/Pain Pain Assessment Pain Assessment: No/denies pain    Home Living Family/patient expects to be discharged to:: Private residence Living Arrangements: Spouse/significant other Available Help at Discharge: Family;Available 24 hours/day Type of Home: House Home Access: Stairs to enter Entrance Stairs-Rails: Left Entrance Stairs-Number of Steps: 2   Home Layout: Multi-level;Able to live on main level with bedroom/bathroom Home Equipment: Agricultural consultant (  2 wheels);Rollator (4 wheels);Cane - single point;Grab bars - tub/shower       Prior Function Prior Level of Function : Independent/Modified Independent;Driving             Mobility Comments: typically ambulatory without DME, works with a Systems analyst at a [employer] multiple times a week       Hand Dominance        Extremity/Trunk Assessment   Upper Extremity Assessment Upper Extremity Assessment: Overall WFL for tasks assessed    Lower Extremity Assessment Lower Extremity Assessment: Generalized weakness    Cervical / Trunk Assessment Cervical / Trunk Assessment: Normal  Communication   Communication: No difficulties  Cognition Arousal/Alertness: Awake/alert Behavior During Therapy: WFL for tasks assessed/performed Overall Cognitive Status: Within Functional Limits for tasks assessed                                          General Comments General comments (skin integrity, edema, etc.): VSS on RA    Exercises     Assessment/Plan    PT Assessment Patient needs continued PT services  PT Problem List Decreased strength;Decreased activity tolerance;Decreased balance;Decreased mobility;Decreased knowledge of use of DME       PT Treatment Interventions DME instruction;Gait training;Stair training;Functional mobility training;Therapeutic activities;Therapeutic exercise;Balance training;Neuromuscular re-education;Patient/family education    PT Goals (Current goals can be found in the Care Plan section)  Acute Rehab PT Goals Patient Stated Goal: to improve strength, return to baseline PT Goal Formulation: With patient Time For Goal Achievement: 10/21/22 Potential to Achieve Goals: Good    Frequency Min 3X/week     Co-evaluation               AM-PAC PT "6 Clicks" Mobility  Outcome Measure Help needed turning from your back to your side while in a flat bed without using bedrails?: None Help needed moving from lying on your back to sitting on the side of a flat bed without using bedrails?: None Help  needed moving to and from a bed to a chair (including a wheelchair)?: A Little Help needed standing up from a chair using your arms (e.g., wheelchair or bedside chair)?: A Little Help needed to walk in hospital room?: A Little Help needed climbing 3-5 steps with a railing? : A Little 6 Click Score: 20    End of Session Equipment Utilized During Treatment: Gait belt Activity Tolerance: Patient tolerated treatment well Patient left: in bed;with call bell/phone within reach Nurse Communication: Mobility status PT Visit Diagnosis: Other abnormalities of gait and mobility (R26.89);Muscle weakness (generalized) (M62.81)    Time: 1545-1610 PT Time Calculation (min) (ACUTE ONLY): 25 min   Charges:   PT Evaluation $PT Eval Low Complexity: 1 Low          Arlyss Gandy, PT, DPT Acute Rehabilitation Office [phone]   Arlyss Gandy 10/07/2022, 4:28 PM

## 2022-10-07 NOTE — Plan of Care (Signed)
  Problem: Education: Goal: Knowledge of General Education information will improve Description: Including pain rating scale, medication(s)/side effects and non-pharmacologic comfort measures Outcome: Completed/Met   

## 2022-10-07 NOTE — Care Management CC44 (Signed)
Condition Code 44 Documentation Completed  Patient Details  Name: Tyler Hayes MRN: [id number] Date of Birth: 14-Jul-1945   Condition Code 44 given:  Yes Patient signature on Condition Code 44 notice:  Yes Documentation of 2 MD's agreement:  Yes Code 44 added to claim:  Yes    Tom-Johnson, Hershal Coria, RN 10/07/2022, 4:03 PM

## 2022-10-07 NOTE — ED Notes (Signed)
ED TO INPATIENT HANDOFF REPORT  ED Nurse Name and Phone #: [phone]  S Name/Age/Gender Tyler Hayes 77 y.o. male Room/Bed: 027C/027C  Code Status   Code Status: Prior  Home/SNF/Other Home Patient oriented to: self, place, time, and situation Is this baseline? Yes   Triage Complete: Triage complete  Chief Complaint Pneumonia [J18.9]  Triage Note Pt presents from fall four days ago but has been getting gradually weaker. Per EMS generalized weakness but LVO of 0. BG 111. PMH of parkinson's.      Allergies Allergies  Allergen Reactions   Other Anaphylaxis   Ace Inhibitors Cough    LISINOPRIL   Antihistamines, Diphenhydramine-Type Other (See Comments)    Other reaction(s): Unknown    Level of Care/Admitting Diagnosis ED Disposition     ED Disposition  Admit   Condition  --   Comment  H[facility] [[id number]]  Level of Care: Med-Surg [16]  May admit patient to [facility] or [facility] if equivalent level of care is available:: Yes  Covid Evaluation: Asymptomatic - no recent exposure (last 10 days) testing not required  Diagnosis: Pneumonia [227785]  Admitting Physician: Briscoe Deutscher [[id number]]  Attending Physician: Briscoe Deutscher [[id number]]  Certification:: I certify this patient will need inpatient services for at least 2 midnights  Estimated Length of Stay: 3          B Medical/Surgery History Past Medical History:  Diagnosis Date   Arthritis    back   Cancer (HCC)    prostate   Diabetes mellitus without complication (HCC)    on meds   Diverticulosis    Hypercholesteremia    Hypertension    Meatal stenosis    Osteoarthritis    Sinus drainage    Tinea pedis    Past Surgical History:  Procedure Laterality Date   BACK SURGERY  1984, 2013   lumbar   COLONOSCOPY  06/10/2019   Dr. Bosie Clos   DG DILATION URETERS  2003   EYE SURGERY Bilateral 1999   Lasik   LEFT HEART CATH AND CORONARY ANGIOGRAPHY N/A  05/26/2018   Procedure: LEFT HEART CATH AND CORONARY ANGIOGRAPHY;  Surgeon: Elder Negus, MD;  Location: [facility] INVASIVE CV LAB;  Service: Cardiovascular;  Laterality: N/A;   POSTERIOR LUMBAR FUSION 4 LEVEL N/A 06/01/2014   Procedure: LUMBAR THREE TO FOUR, LUMBAR FOUR TO FIVE LAMINECTOMY,  RIGHT LUMBAR FUSION AT LUMBAR FOUR TO FIVE;  Surgeon: Karn Cassis, MD;  Location: [facility] NEURO ORS;  Service: Neurosurgery;  Laterality: N/A;  POSSIBLE L2-3 L3-4 L4-5 L5-S1 POSTERIOR LUMBAR INTERBODY FUSION   PROSTATECTOMY  2002     A IV Location/Drains/Wounds Patient Lines/Drains/Airways Status     Active Line/Drains/Airways     Name Placement date Placement time Site Days   Peripheral IV 10/06/22 20 G 1.16" Anterior;Proximal;Right Forearm 10/06/22  2356  Forearm  1   Peripheral IV 10/07/22 20 G 1.16" Left Antecubital 10/07/22  0100  Antecubital  less than 1   Sheath 05/26/18 Right Arterial;Femoral 05/26/18  1205  Arterial;Femoral  1595            Intake/Output Last 24 hours No intake or output data in the 24 hours ending 10/07/22 0243  Labs/Imaging Results for orders placed or performed during the hospital encounter of 10/06/22 (from the past 48 hour(s))  Urinalysis, Routine w reflex microscopic -Urine, Clean Catch     Status: Abnormal   Collection Time: 10/07/22 12:20 AM  Result Value  Ref Range   Color, Urine YELLOW YELLOW   APPearance CLEAR CLEAR   Specific Gravity, Urine 1.016 1.005 - 1.030   pH 5.0 5.0 - 8.0   Glucose, UA >=500 (A) NEGATIVE mg/dL   Hgb urine dipstick SMALL (A) NEGATIVE   Bilirubin Urine NEGATIVE NEGATIVE   Ketones, ur 20 (A) NEGATIVE mg/dL   Protein, ur 30 (A) NEGATIVE mg/dL   Nitrite NEGATIVE NEGATIVE   Leukocytes,Ua NEGATIVE NEGATIVE   RBC / HPF 0-5 0 - 5 RBC/hpf   WBC, UA 0-5 0 - 5 WBC/hpf   Bacteria, UA NONE SEEN NONE SEEN   Squamous Epithelial / HPF 0-5 0 - 5 /HPF    Comment: Performed at [facility], [address] [place], [place] [address]   CBC with Differential     Status: Abnormal   Collection Time: 10/07/22  1:20 AM  Result Value Ref Range   WBC 7.4 4.0 - 10.5 K/uL   RBC 3.78 (L) 4.22 - 5.81 MIL/uL   Hemoglobin 11.3 (L) 13.0 - 17.0 g/dL   HCT 60.4 (L) 54.0 - 98.1 %   MCV 93.1 80.0 - 100.0 fL   MCH 29.9 26.0 - 34.0 pg   MCHC 32.1 30.0 - 36.0 g/dL   RDW 19.1 47.8 - 29.5 %   Platelets 175 150 - 400 K/uL    Comment: REPEATED TO VERIFY   nRBC 0.0 0.0 - 0.2 %   Neutrophils Relative % 76 %   Neutro Abs 5.5 1.7 - 7.7 K/uL   Lymphocytes Relative 7 %   Lymphs Abs 0.5 (L) 0.7 - 4.0 K/uL   Monocytes Relative 17 %   Monocytes Absolute 1.2 (H) 0.1 - 1.0 K/uL   Eosinophils Relative 0 %   Eosinophils Absolute 0.0 0.0 - 0.5 K/uL   Basophils Relative 0 %   Basophils Absolute 0.0 0.0 - 0.1 K/uL   Immature Granulocytes 0 %   Abs Immature Granulocytes 0.03 0.00 - 0.07 K/uL    Comment: Performed at [facility], [address], [place], [place] [address]  Comprehensive metabolic panel     Status: Abnormal   Collection Time: 10/07/22  1:20 AM  Result Value Ref Range   Sodium 134 (L) 135 - 145 mmol/L   Potassium 3.9 3.5 - 5.1 mmol/L    Comment: HEMOLYSIS AT THIS LEVEL MAY AFFECT RESULT   Chloride 98 98 - 111 mmol/L   CO2 23 22 - 32 mmol/L   Glucose, Bld 90 70 - 99 mg/dL    Comment: Glucose reference range applies only to samples taken after fasting for at least 8 hours.   BUN 13 8 - 23 mg/dL   Creatinine, Ser 8.65 0.61 - 1.24 mg/dL   Calcium 8.9 8.9 - 78.4 mg/dL   Total Protein 6.5 6.5 - 8.1 g/dL   Albumin 3.1 (L) 3.5 - 5.0 g/dL   AST 29 15 - 41 U/L    Comment: HEMOLYSIS AT THIS LEVEL MAY AFFECT RESULT   ALT 15 0 - 44 U/L    Comment: HEMOLYSIS AT THIS LEVEL MAY AFFECT RESULT   Alkaline Phosphatase 44 38 - 126 U/L   Total Bilirubin 2.0 (H) 0.3 - 1.2 mg/dL    Comment: HEMOLYSIS AT THIS LEVEL MAY AFFECT RESULT   GFR, Estimated >60 >60 mL/min    Comment: (NOTE) Calculated using the CKD-EPI Creatinine Equation (2021)     Anion gap 13 5 - 15    Comment: Performed at [facility], [address] [place], [place] [address]  CK     Status: None   Collection Time: 10/07/22  1:20 AM  Result Value Ref Range   Total CK 125 49 - 397 U/L    Comment: HEMOLYSIS AT THIS LEVEL MAY AFFECT RESULT Performed at [facility], [address], [place], [place] [address]   Lactic acid, plasma     Status: None   Collection Time: 10/07/22  1:20 AM  Result Value Ref Range   Lactic Acid, Venous 1.5 0.5 - 1.9 mmol/L    Comment: Performed at [facility], [address] [place], [place] [address]   CT HEAD WO CONTRAST ( )  Result Date: 10/07/2022 CLINICAL DATA:  minor head trauma. Fall. Weakness. EXAM: CT HEAD WITHOUT CONTRAST TECHNIQUE: Contiguous axial images were obtained from the base of the skull through the vertex without intravenous contrast. RADIATION DOSE REDUCTION: This exam was performed according to the departmental dose-optimization program which includes automated exposure control, adjustment of the mA and/or kV according to patient size and/or use of iterative reconstruction technique. COMPARISON:  MRI brain 07/24/2022, images only with no available report. FINDINGS: Brain: There is moderately advanced cerebral atrophy with atrophic ventriculomegaly and moderate to severe small-vessel disease of the cerebral white matter. The cerebellum and brainstem are relatively normal. There are benign dural calcifications scattered along the falx. No new asymmetry is seen worrisome for acute cortical based infarct, hemorrhage, mass, mass effect or midline shift. Vascular: There are calcifications in the carotid siphons. There are no hyperdense central vessels. Skull: No appreciable scalp hematoma. The calvarium, skull base and orbits are intact. No lytic lesions. Sinuses/Orbits: Old lens replacements. Otherwise negative orbits. Clear paranasal sinuses. Trace fluid both mastoid tips with otherwise clear mastoids. S  shaped nasal septum. Other: There are bilateral wax impactions in the external auditory canals. IMPRESSION: 1. No acute intracranial CT findings or depressed skull fractures. 2. Chronic changes. 3. Bilateral wax impactions in the external auditory canals. Electronically Signed   By: Almira Bar M.D.   On: 10/07/2022 01:13   DG Chest 2 View  Result Date: 10/07/2022 CLINICAL DATA:  Weakness and shortness of breath. EXAM: CHEST - 2 VIEW COMPARISON:  Chest CT 12/07/2021 FINDINGS: There is patchy airspace disease in the posterior basal right lower lobe consistent with pneumonia. Follow-up study recommended after treatment to ensure clearing. The lungs hypoexpanded but otherwise clear. The cardiomediastinal silhouette and vascular pattern are normal. There is thoracic spondylosis. Thoracic cage is intact. IMPRESSION: Right lower lobe pneumonia. Follow-up study recommended after treatment to ensure clearing. Electronically Signed   By: Almira Bar M.D.   On: 10/07/2022 00:54    Pending Labs Unresulted Labs (From admission, onward)     Start     Ordered   10/07/22 0104  Blood culture (routine x 2)  BLOOD CULTURE X 2,   R      10/07/22 0104            Vitals/Pain Today's Vitals   10/06/22 2346 10/07/22 0000  BP: (!) 157/73   Pulse: 81   Resp: 19   Temp: 98.8 F (37.1 C)   TempSrc: Oral   SpO2: 96% 98%    Isolation Precautions No active isolations  Medications Medications  azithromycin (ZITHROMAX) 500 mg in sodium chloride 0.9 % 250 mL IVPB (500 mg Intravenous New Bag/Given 10/07/22 0203)  sodium chloride 0.9 % bolus 500 mL (500 mLs Intravenous New Bag/Given 10/07/22 0143)  cefTRIAXone (ROCEPHIN) 1 g in sodium chloride 0.9 % 100 mL IVPB (0 g Intravenous Stopped 10/07/22 0204)    Mobility walks with person assist     Focused Assessments Pulmonary Assessment Handoff:  Lung sounds: L Breath Sounds: Coarse crackles R Breath Sounds: Coarse crackles         R Recommendations: See Admitting Provider Note  Report given to:   Additional Notes:

## 2022-10-07 NOTE — ED Notes (Signed)
Pt taken to xray via tech.

## 2022-10-07 NOTE — Progress Notes (Addendum)
Interim progress note not for billing.  I have seen and examined the patient, reviewed chart. Agree with h and p unless otherwise stipulated  Hx htn, parkinsonism, dm, presenting with few days balance difficulty and lower extremity weakness. Has been evaluted by neurology outpatient and plan is LP given concern for NPH. Patient reports similar presentation several years ago when diagnosed with pneumonia that resolved with treatment. He denies respiratory or other infectious symptoms.  Here ct head nothing acute, cxr with RLL infiltrate, procal elevated. Has been started on abx for presumed pneumonia. I will continue those abx. Though given absence of symptoms will also check a urinalysis. Blood cultures have been obtained. Will also check covid and rvp. For his hx possible NPH and new gait dysfunction will check MRI of brain and also request pt/ot consult. Given hx lumbar surgery and report that weakness is primarily in the lower extremities will check MRI of lumbar spine. Patient may benefit from attempting therapeutic LP in house if that can be arranged. Infection can cause worsening of parkinson's symptoms so that may very well be the culprit. Consider neurology consult pending above results and progress here.

## 2022-10-07 NOTE — ED Provider Notes (Signed)
[place] EMERGENCY DEPARTMENT AT [facility] Provider Note   CSN: [id number] Arrival date & time: 10/06/22  2341     History  Chief Complaint  Patient presents with   Weakness    Tyler Hayes is a 77 y.o. male.  The history is provided by the patient and medical records.  Weakness  77 year old male with history of hypertension, essential tremor, rheumatoid arthritis, hyperlipidemia, NPH, Parkinson's disease, chronic kidney disease, presenting to the ED with generalized weakness.  Sustained a fall at home 4 days ago in which she got out of the shower and his legs became weak and gave out on him.  He was attempting to sit down on the toilet but missed and slid to the floor.  He did strike his head on a nearby chair but there was no loss of consciousness.  Wife was able to eventually get him up, however has been increasingly weak since that time.  He describes it as "I feel like I do not have any strength".  His appetite has also been poor, neurology did adjust some of his medications as they felt like that may have been contributing but has still not been eating and drinking very much.  He has not had any vomiting or diarrhea.  He denies any current urinary symptoms.  He denies any focal numbness or weakness.  He has not had any further falls.  Wife has been trying to get him to use a walker over the past few days, however but still only able to take a few steps before his legs give out from weakness, generally ambulates unassisted.  Home Medications Prior to Admission medications   Medication Sig Start Date End Date Taking? Authorizing Provider  Abatacept (ORENCIA IV) Inject into the vein. Gets every 4 weeks for awhile and then goes to monthly infusions    [provider]  albuterol (VENTOLIN HFA) 108 (90 Base) MCG/ACT inhaler Inhale into the lungs. 08/17/20   [provider]  amLODipine (NORVASC) 2.5 MG tablet Take 2.5 mg by mouth daily. 05/09/22    [provider]  aspirin 81 MG chewable tablet 1 tablet 08/16/20   [provider]  atorvastatin (LIPITOR) 80 MG tablet Take 1 tablet (80 mg total) by mouth daily. 09/13/22   Patwardhan, Manish J, MD  carbidopa-levodopa (SINEMET IR) 25-100 MG tablet TAKE 1 TABLET 3 TIMES A DAYWITH WATER 30 MINUTES      BEFORE MEALS 07/11/22   Dohmeier, Porfirio Mylar, MD  carvedilol (COREG) 6.25 MG tablet Take 1 tablet (6.25 mg total) by mouth 2 (two) times daily. 03/13/22   Patwardhan, Anabel Bene, MD  Cholecalciferol 50 MCG (2000 UT) TABS Take 2,000 Units by mouth daily.    [provider]  clonazePAM (KLONOPIN) 0.5 MG tablet At bedtime prn, use 1/2 tab and if needed add second half. 07/11/22   Dohmeier, Porfirio Mylar, MD  donepezil (ARICEPT) 5 MG tablet Take 1 tablet (5 mg total) by mouth 2 (two) times daily. 08/28/22   Dohmeier, Porfirio Mylar, MD  fluticasone (FLONASE) 50 MCG/ACT nasal spray Place 1 spray into both nostrils daily as needed for rhinitis (drainage issues.).     [provider]  KLOR-CON M10 10 MEQ tablet Take 10 mEq by mouth daily. 08/09/21   [provider]  leflunomide (ARAVA) 20 MG tablet Take 20 mg by mouth daily.    [provider]  losartan (COZAAR) 100 MG tablet Take 100 mg by mouth at bedtime. 09/14/20   [provider]  montelukast (SINGULAIR) 10 MG tablet Take 10 mg by mouth every evening.    [provider]  SYNJARDY XR 09-998 MG TB24 Take 2 tablets by mouth daily.  11/12/17   [provider]      Allergies    Other; Ace inhibitors; and Antihistamines, diphenhydramine-type    Review of Systems   Review of Systems  Neurological:  Positive for weakness.  All other systems reviewed and are negative.   Physical Exam Updated Vital Signs BP (!) 157/73 (BP Location: Right Arm)   Pulse 81   Temp 98.8 F (37.1 C) (Oral)   Resp 19   SpO2 98%   Physical Exam Vitals and nursing note reviewed.  Constitutional:      Appearance: He is  well-developed.  HENT:     Head: Normocephalic and atraumatic.     Mouth/Throat:     Mouth: Mucous membranes are dry.  Eyes:     Conjunctiva/sclera: Conjunctivae normal.     Pupils: Pupils are equal, round, and reactive to light.  Cardiovascular:     Rate and Rhythm: Normal rate and regular rhythm.     Heart sounds: Normal heart sounds.  Pulmonary:     Effort: Pulmonary effort is normal.     Breath sounds: Normal breath sounds.  Abdominal:     General: Bowel sounds are normal.     Palpations: Abdomen is soft.  Musculoskeletal:        General: Normal range of motion.     Cervical back: Normal range of motion.  Skin:    General: Skin is warm and dry.  Neurological:     Mental Status: He is alert and oriented to person, place, and time.     Comments: Awake, alert, oriented x 3, does have resting tremor of the mouth and hands bilaterally, no strength or sensory deficit noted, speech is clear and goal oriented     ED Results / Procedures / Treatments   Labs (all labs ordered are listed, but only abnormal results are displayed) Labs Reviewed  CBC WITH DIFFERENTIAL/PLATELET - Abnormal; Notable for the following components:      Result Value   RBC 3.78 (*)    Hemoglobin 11.3 (*)    HCT 35.2 (*)    Lymphs Abs 0.5 (*)    Monocytes Absolute 1.2 (*)    All other components within normal limits  COMPREHENSIVE METABOLIC PANEL - Abnormal; Notable for the following components:   Sodium 134 (*)    Albumin 3.1 (*)    Total Bilirubin 2.0 (*)    All other components within normal limits  URINALYSIS, ROUTINE W REFLEX MICROSCOPIC - Abnormal; Notable for the following components:   Glucose, UA >=500 (*)    Hgb urine dipstick SMALL (*)    Ketones, ur 20 (*)    Protein, ur 30 (*)    All other components within normal limits  CULTURE, BLOOD (ROUTINE X 2)  CULTURE, BLOOD (ROUTINE X 2)  CK  LACTIC ACID, PLASMA    EKG EKG Interpretation  Date/Time:  Monday Oct 07 2022 00:16:32  EDT Ventricular Rate:  78 PR Interval:  173 QRS Duration: 136 QT Interval:  410 QTC Calculation: 467 R Axis:   90 Text Interpretation: Sinus rhythm Left bundle branch block No significant change since recent tracings from 08/2022 or 02/2022 Confirmed by Drema Pry [phone]) on 10/07/2022 12:26:46 AM  Radiology CT HEAD WO CONTRAST ( )  Result Date: 10/07/2022 CLINICAL DATA:  minor head trauma.  Fall. Weakness. EXAM: CT HEAD WITHOUT CONTRAST TECHNIQUE: Contiguous axial images were obtained from the base of the skull through the vertex without intravenous contrast. RADIATION DOSE REDUCTION: This exam was performed according to the departmental dose-optimization program which includes automated exposure control, adjustment of the mA and/or kV according to patient size and/or use of iterative reconstruction technique. COMPARISON:  MRI brain 07/24/2022, images only with no available report. FINDINGS: Brain: There is moderately advanced cerebral atrophy with atrophic ventriculomegaly and moderate to severe small-vessel disease of the cerebral white matter. The cerebellum and brainstem are relatively normal. There are benign dural calcifications scattered along the falx. No new asymmetry is seen worrisome for acute cortical based infarct, hemorrhage, mass, mass effect or midline shift. Vascular: There are calcifications in the carotid siphons. There are no hyperdense central vessels. Skull: No appreciable scalp hematoma. The calvarium, skull base and orbits are intact. No lytic lesions. Sinuses/Orbits: Old lens replacements. Otherwise negative orbits. Clear paranasal sinuses. Trace fluid both mastoid tips with otherwise clear mastoids. S shaped nasal septum. Other: There are bilateral wax impactions in the external auditory canals. IMPRESSION: 1. No acute intracranial CT findings or depressed skull fractures. 2. Chronic changes. 3. Bilateral wax impactions in the external auditory canals. Electronically Signed    By: Almira Bar M.D.   On: 10/07/2022 01:13   DG Chest 2 View  Result Date: 10/07/2022 CLINICAL DATA:  Weakness and shortness of breath. EXAM: CHEST - 2 VIEW COMPARISON:  Chest CT 12/07/2021 FINDINGS: There is patchy airspace disease in the posterior basal right lower lobe consistent with pneumonia. Follow-up study recommended after treatment to ensure clearing. The lungs hypoexpanded but otherwise clear. The cardiomediastinal silhouette and vascular pattern are normal. There is thoracic spondylosis. Thoracic cage is intact. IMPRESSION: Right lower lobe pneumonia. Follow-up study recommended after treatment to ensure clearing. Electronically Signed   By: Almira Bar M.D.   On: 10/07/2022 00:54    Procedures Procedures    CRITICAL CARE Performed by: Garlon Hatchet   Total critical care time: 45 minutes  Critical care time was exclusive of separately billable procedures and treating other patients.  Critical care was necessary to treat or prevent imminent or life-threatening deterioration.  Critical care was time spent personally by me on the following activities: development of treatment plan with patient and/or surrogate as well as nursing, discussions with consultants, evaluation of patient's response to treatment, examination of patient, obtaining history from patient or surrogate, ordering and performing treatments and interventions, ordering and review of laboratory studies, ordering and review of radiographic studies, pulse oximetry and re-evaluation of patient's condition.   Medications Ordered in ED Medications  azithromycin (ZITHROMAX) 500 mg in sodium chloride 0.9 % 250 mL IVPB (500 mg Intravenous New Bag/Given 10/07/22 0203)  sodium chloride 0.9 % bolus 500 mL (500 mLs Intravenous New Bag/Given 10/07/22 0143)  cefTRIAXone (ROCEPHIN) 1 g in sodium chloride 0.9 % 100 mL IVPB (0 g Intravenous Stopped 10/07/22 0204)    ED Course/ Medical Decision Making/ A&P                              Medical Decision Making Amount and/or Complexity of Data Reviewed Labs: ordered. Radiology: ordered and independent interpretation performed. ECG/medicine tests: ordered and independent interpretation performed.  Risk Decision regarding hospitalization.   77 year old male presenting to the ED with generalized weakness.  Sustained a fall 4 days ago at home in which his legs gave  out, attempting to sit on the toilet and slid to the floor.  Did strike his head on a chair but no loss of consciousness.  Has been having difficulty getting up and moving around at home since.  Appetite has also been poor.  No reported fevers, vomiting, or diarrhea.  He is afebrile and nontoxic in appearance here.  His vitals are stable.  He has no focal neurologic deficits, awake, alert, oriented to baseline.  He does seem a little bit clinically dry, suspect likely from poor oral intake.  Will check labs, CK as [facility] did [facility].  Also obtain chest x-ray, CT head.  EKG with left bundle branch block, unchanged from prior.  CT head with chronic findings, nothing acute.  Chest x-ray with right lower lobe pneumonia.  Wife reports history of same causing generalized weakness similar to this.  No leukocytosis, lactate normal as well.  Chemistry reassuring without electrolyte derangement.  Normal CK.  Blood cultures pending.  Started on IV Rocephin and azithromycin, will plan admission.  May benefit from PT evaluation as well.    Discussed with hospitalist, Dr. Antionette Char-- will admit for ongoing care.  Final Clinical Impression(s) / ED Diagnoses Final diagnoses:  Community acquired pneumonia of right lower lobe of lung  Generalized weakness    Rx / DC Orders ED Discharge Orders     None         Garlon Hatchet, PA-C 10/07/22 0228    Nira Conn, MD 10/07/22 [phone]

## 2022-10-07 NOTE — TOC Initial Note (Signed)
Transition of Care [facility]) - Initial/Assessment Note    Patient Details  Name: Tyler Hayes MRN: [id number] Date of Birth: 01-24-1946  [facility] CM/SW Contact:    Tom-Johnson, Hershal Coria, RN Phone Number: 10/07/2022, 1:45 PM  Clinical Narrative:                  CM spoke with patient at bedside about needs for post hospital transition.  Admitted for Generalized Weakness and Fatigue, found to have Pneumonia. On IV Rocephin and oral Zithromax. Patient has hx of Parkinson Disease and mild Cognitive Impairment.    Patient is from home with wife, has three children and six siblings, all lives out of state. Independent with care and wife assists as needed. Has a cane, walker and built-in shower seat at home.  PCP is Merri Brunette, MD and [facility] [address].  No TOC needs or recommendations noted at this time. MD notified for PT/OT eval for disposition.   CM will continue to follow as patient progresses with care towards discharge.          Barriers to Discharge: Continued Medical Work up   Patient Goals and CMS Choice Patient states their goals for this hospitalization and ongoing recovery are:: To return home CMS Medicare.gov Compare Post Acute Care list provided to:: Patient Choice offered to / list presented to : Patient, Spouse      Expected Discharge Plan and Services   Discharge Planning Services: CM Consult   Living arrangements for the past 2 months: Single Family Home                                      Prior Living Arrangements/Services Living arrangements for the past 2 months: Single Family Home Lives with:: Spouse Patient language and need for interpreter reviewed:: Yes Do you feel safe going back to the place where you live?: Yes      Need for Family Participation in Patient Care: Yes (Comment) Care giver support system in place?: Yes (comment) Current home services: DME (Cane, walker, built-in shower  seat.) Criminal Activity/Legal Involvement Pertinent to Current Situation/Hospitalization: No - Comment as needed  Activities of Daily Living Home Assistive Devices/Equipment: Other (Comment), CBG Meter (rolator) ADL Screening (condition at time of admission) Patient's cognitive ability adequate to safely complete daily activities?: Yes Is the patient deaf or have difficulty hearing?: No Does the patient have difficulty seeing, even when wearing glasses/contacts?: No Does the patient have difficulty concentrating, remembering, or making decisions?: No Patient able to express need for assistance with ADLs?: Yes Does the patient have difficulty dressing or bathing?: Yes Independently performs ADLs?: No Communication: Independent Dressing (OT): Needs assistance Is this a change from baseline?: Pre-admission baseline Grooming: Independent Feeding: Independent Bathing: Needs assistance Is this a change from baseline?: Pre-admission baseline Toileting: Needs assistance Is this a change from baseline?: Pre-admission baseline In/Out Bed: Needs assistance Is this a change from baseline?: Pre-admission baseline Walks in Home: Needs assistance Is this a change from baseline?: Pre-admission baseline Does the patient have difficulty walking or climbing stairs?: Yes Weakness of Legs: Both Weakness of Arms/Hands: None  Permission Sought/Granted Permission sought to share information with : Case Manager, Family Supports Permission granted to share information with : Yes, Verbal Permission Granted              Emotional Assessment Appearance:: Appears stated age Attitude/Demeanor/Rapport: Engaged,  Gracious Affect (typically observed): Accepting, Appropriate, Calm, Hopeful, Pleasant Orientation: : Oriented to Self, Oriented to  Time, Oriented to Place, Oriented to Situation Alcohol / Substance Use: Not Applicable Psych Involvement: No (comment)  Admission diagnosis:  Pneumonia  [J18.9] Generalized weakness [R53.1] Community acquired pneumonia of right lower lobe of lung [J18.9] Patient Active Problem List   Diagnosis Date Noted   Pneumonia 10/07/2022   T2DM (type 2 diabetes mellitus) (HCC) 10/07/2022   Parkinsonism 08/28/2022   NPH (normal pressure hydrocephalus) (HCC) 08/28/2022   Stage 3a chronic kidney disease (HCC) 07/11/2022   Pain due to onychomycosis of toenails of both feet 06/05/2022   Dysphonia 02/28/2021   MCI (mild cognitive impairment) 02/28/2021   Coronary artery disease involving native coronary artery of native heart without angina pectoris 10/27/2020   Gait disturbance 08/29/2020   Essential tremor 08/29/2020   Elevated coronary artery calcium score 08/24/2020   Primary parkinsonism 05/04/2020   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   Mixed hyperlipidemia 01/25/2020   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 AT [address] [facility] [address] [place] [address] Phone: [phone] Fax: [phone]  [facility] [place], [place] - [address] [facility] AT [address] [facility] [address] [place] [address] Phone: [phone] Fax: [phone]     Social Determinants of Health (SDOH) Social History: SDOH Screenings   Food Insecurity: No Food Insecurity (10/07/2022)  Housing:  Low Risk  (10/07/2022)  Transportation Needs: No Transportation Needs (10/07/2022)  Utilities: Not At Risk (10/07/2022)  Tobacco Use: Medium Risk (10/07/2022)   SDOH Interventions: Transportation Interventions: Intervention Not Indicated, Inpatient TOC, Patient Resources (Friends/Family)   Readmission Risk Interventions    10/07/2022    1:43 PM  Readmission Risk Prevention Plan  Transportation Screening Complete  PCP or Specialist Appt within 5-7 Days Complete  Home Care Screening Complete  Medication Review (RN CM) Referral to Pharmacy

## 2022-10-07 NOTE — H&P (Signed)
History and Physical    Tyler Hayes ZOX:[id number] DOB: December 15, 1945 DOA: 10/06/2022  PCP: Merri Brunette, MD   Patient coming from: Home   Chief Complaint: Generalized weakness, fatigue   HPI: Tyler Hayes is a pleasant 77 y.o. male with medical history significant for Parkinson disease, mild cognitive impairment, hypertension, hyperlipidemia, and elevated coronary calcium score who presents to the emergency department with generalized weakness and fatigue.  Patient reports that he became generally weak with increased fatigue 4 or 5 days ago, slid to the ground while attempting to ambulate with his walker few days ago, and has continued to have difficulty with his usual activities due to profound fatigue and generalized weakness.  He has not felt particularly short of breath and has not been coughing much.  He has not noticed any fever or chills.  He reports experiencing these same symptoms a few years ago when he was found to have pneumonia, and notes that he returned to baseline with treatment of the pneumonia.  ED Course: Upon arrival to the ED, patient is found to be afebrile and saturating mid 90s on room air with normal heart rate and stable blood pressure.  Chest x-ray is concerning for right lower lobe pneumonia.  Head CT is negative for acute findings.   Blood cultures were collected in the ED and the patient was treated with 500 mL of saline, Rocephin, and azithromycin.  Review of Systems:  All other systems reviewed and apart from HPI, are negative.  Past Medical History:  Diagnosis Date   Arthritis    back   Cancer [facility])    prostate   Diabetes mellitus without complication (HCC)    on meds   Diverticulosis    Hypercholesteremia    Hypertension    Meatal stenosis    Osteoarthritis    Sinus drainage    Tinea pedis     Past Surgical History:  Procedure Laterality Date   BACK SURGERY  1984, 2013   lumbar   COLONOSCOPY  06/10/2019   Dr. Bosie Clos   DG  DILATION URETERS  2003   EYE SURGERY Bilateral 1999   Lasik   LEFT HEART CATH AND CORONARY ANGIOGRAPHY N/A 05/26/2018   Procedure: LEFT HEART CATH AND CORONARY ANGIOGRAPHY;  Surgeon: Elder Negus, MD;  Location: [facility] INVASIVE CV LAB;  Service: Cardiovascular;  Laterality: N/A;   POSTERIOR LUMBAR FUSION 4 LEVEL N/A 06/01/2014   Procedure: LUMBAR THREE TO FOUR, LUMBAR FOUR TO FIVE LAMINECTOMY,  RIGHT LUMBAR FUSION AT LUMBAR FOUR TO FIVE;  Surgeon: Karn Cassis, MD;  Location: [facility] NEURO ORS;  Service: Neurosurgery;  Laterality: N/A;  POSSIBLE L2-3 L3-4 L4-5 L5-S1 POSTERIOR LUMBAR INTERBODY FUSION   PROSTATECTOMY  2002    Social History:   reports that he quit smoking about 31 years ago. His smoking use included cigarettes. He has a 30.00 pack-year smoking history. He has never used smokeless tobacco. He reports current alcohol use. He reports that he does not use drugs.  Allergies  Allergen Reactions   Other Anaphylaxis   Ace Inhibitors Cough    LISINOPRIL   Antihistamines, Diphenhydramine-Type Other (See Comments)    Other reaction(s): Unknown    Family History  Problem Relation Age of Onset   Heart failure Mother    CAD Mother    CAD Sister        Brain tumor   Stroke Sister    Cancer Father    Stroke Sister    Coronary artery disease  Sister    Hypertension Son    Diabetes Son    Obesity Son    Sleep apnea Son    Heart murmur Son        No major structural issues noted.     Prior to Admission medications   Medication Sig Start Date End Date Taking? Authorizing Provider  Abatacept (ORENCIA IV) Inject into the vein. Gets every 4 weeks for awhile and then goes to monthly infusions    [provider]  albuterol (VENTOLIN HFA) 108 (90 Base) MCG/ACT inhaler Inhale into the lungs. 08/17/20   [provider]  amLODipine (NORVASC) 2.5 MG tablet Take 2.5 mg by mouth daily. 05/09/22   [provider]  aspirin 81 MG chewable tablet 1 tablet 08/16/20    [provider]  atorvastatin (LIPITOR) 80 MG tablet Take 1 tablet (80 mg total) by mouth daily. 09/13/22   Patwardhan, Manish J, MD  carbidopa-levodopa (SINEMET IR) 25-100 MG tablet TAKE 1 TABLET 3 TIMES A DAYWITH WATER 30 MINUTES      BEFORE MEALS 07/11/22   Dohmeier, Porfirio Mylar, MD  carvedilol (COREG) 6.25 MG tablet Take 1 tablet (6.25 mg total) by mouth 2 (two) times daily. 03/13/22   Patwardhan, Anabel Bene, MD  Cholecalciferol 50 MCG (2000 UT) TABS Take 2,000 Units by mouth daily.    [provider]  clonazePAM (KLONOPIN) 0.5 MG tablet At bedtime prn, use 1/2 tab and if needed add second half. 07/11/22   Dohmeier, Porfirio Mylar, MD  donepezil (ARICEPT) 5 MG tablet Take 1 tablet (5 mg total) by mouth 2 (two) times daily. 08/28/22   Dohmeier, Porfirio Mylar, MD  fluticasone (FLONASE) 50 MCG/ACT nasal spray Place 1 spray into both nostrils daily as needed for rhinitis (drainage issues.).     [provider]  KLOR-CON M10 10 MEQ tablet Take 10 mEq by mouth daily. 08/09/21   [provider]  leflunomide (ARAVA) 20 MG tablet Take 20 mg by mouth daily.    [provider]  losartan (COZAAR) 100 MG tablet Take 100 mg by mouth at bedtime. 09/14/20   [provider]  montelukast (SINGULAIR) 10 MG tablet Take 10 mg by mouth every evening.    [provider]  SYNJARDY XR 09-998 MG TB24 Take 2 tablets by mouth daily.  11/12/17   [provider]    Physical Exam: Vitals:   10/06/22 2346 10/07/22 0000  BP: (!) 157/73   Pulse: 81   Resp: 19   Temp: 98.8 F (37.1 C)   TempSrc: Oral   SpO2: 96% 98%    Constitutional: NAD, calm  Eyes: PERTLA, lids and conjunctivae normal ENMT: Mucous membranes are moist. Posterior pharynx clear of any exudate or lesions.   Neck: supple, no masses  Respiratory: no wheezing, no crackles. No accessory muscle use.  Cardiovascular: S1 & S2 heard, regular rate and rhythm. No extremity edema.   Abdomen: No distension, no  tenderness, soft. Bowel sounds active.  Musculoskeletal: no clubbing / cyanosis. No joint deformity upper and lower extremities.   Skin: no significant rashes, lesions, ulcers. Warm, dry, well-perfused. Neurologic: CN 2-12 grossly intact. Moving all extremities. Alert and oriented. Resting tremor.  Psychiatric: Pleasant. Cooperative.    Labs and Imaging on Admission: I have personally reviewed following labs and imaging studies  CBC: Recent Labs  Lab 10/07/22 0120  WBC 7.4  NEUTROABS 5.5  HGB 11.3*  HCT 35.2*  MCV 93.1  PLT 175   Basic Metabolic Panel: Recent Labs  Lab 10/07/22 0120  NA 134*  K 3.9  CL 98  CO2 23  GLUCOSE 90  BUN 13  CREATININE 1.22  CALCIUM 8.9   GFR: CrCl cannot be calculated (Unknown ideal weight.). Liver Function Tests: Recent Labs  Lab 10/07/22 0120  AST 29  ALT 15  ALKPHOS 44  BILITOT 2.0*  PROT 6.5  ALBUMIN 3.1*   No results for input(s): "LIPASE", "AMYLASE" in the last 168 hours. No results for input(s): "AMMONIA" in the last 168 hours. Coagulation Profile: No results for input(s): "INR", "PROTIME" in the last 168 hours. Cardiac Enzymes: Recent Labs  Lab 10/07/22 0120  CKTOTAL 125   BNP (last 3 results) No results for input(s): "PROBNP" in the last 8760 hours. HbA1C: No results for input(s): "HGBA1C" in the last 72 hours. CBG: No results for input(s): "GLUCAP" in the last 168 hours. Lipid Profile: No results for input(s): "CHOL", "HDL", "LDLCALC", "TRIG", "CHOLHDL", "LDLDIRECT" in the last 72 hours. Thyroid Function Tests: No results for input(s): "TSH", "T4TOTAL", "FREET4", "T3FREE", "THYROIDAB" in the last 72 hours. Anemia Panel: No results for input(s): "VITAMINB12", "FOLATE", "FERRITIN", "TIBC", "IRON", "RETICCTPCT" in the last 72 hours. Urine analysis:    Component Value Date/Time   COLORURINE YELLOW 10/07/2022 0020   APPEARANCEUR CLEAR 10/07/2022 0020   LABSPEC 1.016 10/07/2022 0020   PHURINE 5.0 10/07/2022 0020    GLUCOSEU >=500 (A) 10/07/2022 0020   HGBUR SMALL (A) 10/07/2022 0020   BILIRUBINUR NEGATIVE 10/07/2022 0020   KETONESUR 20 (A) 10/07/2022 0020   PROTEINUR 30 (A) 10/07/2022 0020   NITRITE NEGATIVE 10/07/2022 0020   LEUKOCYTESUR NEGATIVE 10/07/2022 0020   Sepsis Labs: @LABRCNTIP (procalcitonin:4,lacticidven:4) )No results found for this or any previous visit (from the past 240 hour(s)).   Radiological Exams on Admission: CT HEAD WO CONTRAST ( )  Result Date: 10/07/2022 CLINICAL DATA:  minor head trauma. Fall. Weakness. EXAM: CT HEAD WITHOUT CONTRAST TECHNIQUE: Contiguous axial images were obtained from the base of the skull through the vertex without intravenous contrast. RADIATION DOSE REDUCTION: This exam was performed according to the departmental dose-optimization program which includes automated exposure control, adjustment of the mA and/or kV according to patient size and/or use of iterative reconstruction technique. COMPARISON:  MRI brain 07/24/2022, images only with no available report. FINDINGS: Brain: There is moderately advanced cerebral atrophy with atrophic ventriculomegaly and moderate to severe small-vessel disease of the cerebral white matter. The cerebellum and brainstem are relatively normal. There are benign dural calcifications scattered along the falx. No new asymmetry is seen worrisome for acute cortical based infarct, hemorrhage, mass, mass effect or midline shift. Vascular: There are calcifications in the carotid siphons. There are no hyperdense central vessels. Skull: No appreciable scalp hematoma. The calvarium, skull base and orbits are intact. No lytic lesions. Sinuses/Orbits: Old lens replacements. Otherwise negative orbits. Clear paranasal sinuses. Trace fluid both mastoid tips with otherwise clear mastoids. S shaped nasal septum. Other: There are bilateral wax impactions in the external auditory canals. IMPRESSION: 1. No acute intracranial CT findings or depressed  skull fractures. 2. Chronic changes. 3. Bilateral wax impactions in the external auditory canals. Electronically Signed   By: Almira Bar M.D.   On: 10/07/2022 01:13   DG Chest 2 View  Result Date: 10/07/2022 CLINICAL DATA:  Weakness and shortness of breath. EXAM: CHEST - 2 VIEW COMPARISON:  Chest CT 12/07/2021 FINDINGS: There is patchy airspace disease in the posterior basal right lower lobe consistent with pneumonia. Follow-up study recommended after treatment to ensure clearing. The  lungs hypoexpanded but otherwise clear. The cardiomediastinal silhouette and vascular pattern are normal. There is thoracic spondylosis. Thoracic cage is intact. IMPRESSION: Right lower lobe pneumonia. Follow-up study recommended after treatment to ensure clearing. Electronically Signed   By: Almira Bar M.D.   On: 10/07/2022 00:54    EKG: Independently reviewed. Sinus rhythm, LBBB.   Assessment/Plan   1. Pneumonia  - Not septic on admission  - Blood cultures collected in ED and Rocephin and azithromycin were started   - Continue current antibiotics, trend procalcitonin, check strep pneumo and legionella antigens, follow cultures and clinical course    2. Generalized weakness  - CK is normal and there are no acute findings on head CT in ED  - Likely from PNA, he had similar presentation previously, plan to treat pneumonia and consider PT consultation if remains weak    3. Parkinson disease; mild cognitive impairment  - Continue Sinemet and Aricept, use delirium precautions    4. HTN  - Continue Norvasc, Coreg, and losartan   5. CAD  - No angina  - Continue ASA, Lipitor, and Coreg   6. Inflammatory arthritis  - Hold leflunomide while treating infection    DVT prophylaxis: Lovenox  Code Status: Full  Level of Care: Level of care: Med-Surg Family Communication: Wife updated from ED  Disposition Plan:  Patient is from: Home  Anticipated d/c is to: TBD Anticipated d/c date is: 10/10/22  Patient  currently: Pending treatment of PNA, may need PT eval  Consults called: none  Admission status: Inpatient     Briscoe Deutscher, MD [facility]  10/07/2022, 3:23 AM

## 2022-10-07 NOTE — Plan of Care (Signed)
  Problem: Activity: Goal: Ability to tolerate increased activity will improve Outcome: Progressing   Problem: Respiratory: Goal: Ability to maintain adequate ventilation will improve Outcome: Progressing Goal: Ability to maintain a clear airway will improve Outcome: Progressing   Problem: Clinical Measurements: Goal: Respiratory complications will improve Outcome: Progressing Goal: Cardiovascular complication will be avoided Outcome: Progressing   Problem: Nutrition: Goal: Adequate nutrition will be maintained Outcome: Progressing   Problem: Coping: Goal: Level of anxiety will decrease Outcome: Progressing   Problem: Elimination: Goal: Will not experience complications related to bowel motility Outcome: Progressing Goal: Will not experience complications related to urinary retention Outcome: Progressing   Problem: Pain Managment: Goal: General experience of comfort will improve Outcome: Progressing

## 2022-10-07 NOTE — Care Management Obs Status (Signed)
MEDICARE OBSERVATION STATUS NOTIFICATION   Patient Details  Name: Tyler Hayes MRN: [id number] Date of Birth: 08-28-1945   Medicare Observation Status Notification Given:  Yes    Tom-Johnson, Hershal Coria, RN 10/07/2022, 4:02 PM

## 2022-10-08 DIAGNOSIS — G20C Parkinsonism, unspecified: Secondary | ICD-10-CM | POA: Diagnosis not present

## 2022-10-08 DIAGNOSIS — J189 Pneumonia, unspecified organism: Secondary | ICD-10-CM | POA: Diagnosis not present

## 2022-10-08 DIAGNOSIS — I2510 Atherosclerotic heart disease of native coronary artery without angina pectoris: Secondary | ICD-10-CM | POA: Diagnosis not present

## 2022-10-08 DIAGNOSIS — I10 Essential (primary) hypertension: Secondary | ICD-10-CM | POA: Diagnosis not present

## 2022-10-08 LAB — CBC
HCT: 36.5 % — ABNORMAL LOW (ref 39.0–52.0)
Hemoglobin: 11.5 g/dL — ABNORMAL LOW (ref 13.0–17.0)
MCH: 29 pg (ref 26.0–34.0)
MCHC: 31.5 g/dL (ref 30.0–36.0)
MCV: 92.2 fL (ref 80.0–100.0)
Platelets: 205 10*3/uL (ref 150–400)
RBC: 3.96 MIL/uL — ABNORMAL LOW (ref 4.22–5.81)
RDW: 12.5 % (ref 11.5–15.5)
WBC: 5.4 10*3/uL (ref 4.0–10.5)
nRBC: 0 % (ref 0.0–0.2)

## 2022-10-08 LAB — GLUCOSE, CAPILLARY
Glucose-Capillary: 151 mg/dL — ABNORMAL HIGH (ref 70–99)
Glucose-Capillary: 89 mg/dL (ref 70–99)

## 2022-10-08 LAB — COMPREHENSIVE METABOLIC PANEL
ALT: 17 U/L (ref 0–44)
AST: 22 U/L (ref 15–41)
Albumin: 3 g/dL — ABNORMAL LOW (ref 3.5–5.0)
Alkaline Phosphatase: 51 U/L (ref 38–126)
Anion gap: 9 (ref 5–15)
BUN: 9 mg/dL (ref 8–23)
CO2: 22 mmol/L (ref 22–32)
Calcium: 8.9 mg/dL (ref 8.9–10.3)
Chloride: 106 mmol/L (ref 98–111)
Creatinine, Ser: 1.01 mg/dL (ref 0.61–1.24)
GFR, Estimated: >60 mL/min (ref >60)
Glucose, Bld: 93 mg/dL (ref 70–99)
Potassium: 3.9 mmol/L (ref 3.5–5.1)
Sodium: 137 mmol/L (ref 135–145)
Total Bilirubin: 1.2 mg/dL (ref 0.3–1.2)
Total Protein: 6.9 g/dL (ref 6.5–8.1)

## 2022-10-08 LAB — PROCALCITONIN: Procalcitonin: 0.27 ng/mL

## 2022-10-08 LAB — CULTURE, BLOOD (ROUTINE X 2): Culture: NO GROWTH

## 2022-10-08 MED ORDER — AMOXICILLIN-POT CLAVULANATE 875-125 MG PO TABS: 1.0000 | ORAL_TABLET | Freq: Two times a day (BID) | ORAL | 0 refills | Status: AC

## 2022-10-08 NOTE — Discharge Summary (Signed)
Physician Discharge Summary  Tyler Hayes ZOX:[id number] DOB: 1946/04/08 DOA: 10/06/2022  PCP: Merri Brunette, MD  Admit date: 10/06/2022 Discharge date: 10/08/2022  Admitted From: Home Disposition: Home with home health PT  Recommendations for Outpatient Follow-up:  Follow up with PCP in 1-2 weeks Follow-up with neurology as scheduled  Home Health: PT Equipment/Devices: Already present at home  Discharge Condition: Fair CODE STATUS: Full code Diet recommendation: Low-salt diet  Discharge summary: 77 year old with history of Parkinson's, inflammatory arthritis, hypertension and type 2 diabetes presented to the emergency room with few days of off balance, lower extremity weakness.  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  Had weakness and increasing fatigue for 4 to 5 days, slid to the ground while attempting to ambulate with his walker few days ago.  Without any fever or chills, cough or wheezing.  In the emergency room hemodynamically stable.  On room air.  Chest x-ray with right lower lobe infiltrate.  Head CT negative.  Cultures were drawn, he was started on Rocephin and azithromycin.  Right lower lobe pneumonia: Atypical features.  Patient without fever, cough or shortness of breath.  This could be chronic finding or chronic aspiration pneumonitis.  Currently no clinical evidence of aspiration.  Cultures are negative.  RSV, flu and COVID-negative. Completed 2 days of Rocephin, will complete 5 additional days of Augmentin.  Generalized weakness, worsening tremors and weakness in the context of acute illness in a patient with Parkinson's disease: Clinically improving today. His gait dysfunction was likely worsened with acute infection deconditioning. MRI brain shows mild ventriculomegaly, MRI of the lumbar spine with previous surgery, mostly chronic osteoarthritic changes.  He will continue to work with PT at home. Patient will  follow-up with neurology clinic for possible spinal tap studies, before and after neurological testing.  Chronic medical issues including Essential hypertension, on Norvasc Coreg and losartan.  Continue. Coronary artery disease, on aspirin Lipitor and Coreg.  Continue. Inflammatory arthritis, since patient does not have evidence of overwhelming bacterial infection, he can go back on leflunomide.       Discharge Diagnoses:  Principal Problem:   Pneumonia Active Problems:   Nonischemic cardiomyopathy (HCC)   Essential hypertension   Inflammatory osteoarthritis   Coronary artery disease involving native coronary artery of native heart without angina pectoris   MCI (mild cognitive impairment)   Stage 3a chronic kidney disease (HCC)   Parkinsonism   T2DM (type 2 diabetes mellitus) (HCC)    Discharge Instructions  Discharge Instructions     Diet general   Complete by: As directed    Increase activity slowly   Complete by: As directed       Allergies as of 10/08/2022       Reactions   Other Anaphylaxis   Ace Inhibitors Cough   LISINOPRIL   Antihistamines, Diphenhydramine-type Other (See Comments)   Other reaction(s): Unknown        Medication List     TAKE these medications    albuterol 108 (90 Base) MCG/ACT inhaler Commonly known as: VENTOLIN HFA Inhale 1-2 puffs into the lungs every 6 (six) hours as needed for wheezing or shortness of breath.   amLODipine 2.5 MG tablet Commonly known as: NORVASC Take 2.5 mg by mouth daily.   amoxicillin-clavulanate 875-125 MG tablet Commonly known as: AUGMENTIN Take 1 tablet by mouth 2 (two) times daily for 5 days.   aspirin 81 MG chewable tablet Chew 81 mg by mouth daily.   atorvastatin  80 MG tablet Commonly known as: LIPITOR Take 1 tablet (80 mg total) by mouth daily.   carbidopa-levodopa 25-100 MG tablet Commonly known as: SINEMET IR TAKE 1 TABLET 3 TIMES A DAYWITH WATER 30 MINUTES      BEFORE MEALS What  changed:  how much to take how to take this when to take this   carvedilol 6.25 MG tablet Commonly known as: COREG Take 1 tablet (6.25 mg total) by mouth 2 (two) times daily.   Cholecalciferol 50 MCG (2000 UT) Tabs Take 2,000 Units by mouth daily.   clonazePAM 0.5 MG tablet Commonly known as: KLONOPIN At bedtime prn, use 1/2 tab and if needed add second half. What changed:  how much to take how to take this when to take this reasons to take this   donepezil 5 MG tablet Commonly known as: ARICEPT Take 1 tablet (5 mg total) by mouth 2 (two) times daily.   fluticasone 50 MCG/ACT nasal spray Commonly known as: FLONASE Place 1 spray into both nostrils daily as needed for rhinitis (drainage issues.).   Klor-Con M10 10 MEQ tablet Generic drug: potassium chloride Take 10 mEq by mouth daily.   leflunomide 20 MG tablet Commonly known as: ARAVA Take 20 mg by mouth daily.   losartan 100 MG tablet Commonly known as: COZAAR Take 100 mg by mouth at bedtime.   montelukast 10 MG tablet Commonly known as: SINGULAIR Take 10 mg by mouth every evening.   ORENCIA IV Inject into the vein. Gets every 4 weeks for awhile and then goes to monthly infusions   Synjardy XR 09-998 MG Tb24 Generic drug: Empagliflozin-metFORMIN HCl ER Take 2 tablets by mouth daily.        Allergies  Allergen Reactions   Other Anaphylaxis   Ace Inhibitors Cough    LISINOPRIL   Antihistamines, Diphenhydramine-Type Other (See Comments)    Other reaction(s): Unknown    Consultations: None   Procedures/Studies: MR BRAIN WO CONTRAST  Result Date: 10/07/2022 CLINICAL DATA:  Neuro deficit, acute, stroke suspected. Ambulatory dysfunction. EXAM: MRI HEAD WITHOUT CONTRAST TECHNIQUE: Multiplanar, multiecho pulse sequences of the brain and surrounding structures were obtained without intravenous contrast. COMPARISON:  MRI brain 07/24/2022. FINDINGS: Brain: No acute infarct or hemorrhage. Unchanged moderate  chronic small-vessel disease. Disproportionate prominence of the ventricles relative to the sulci with elevated Evans index and acute callosal angle, which can be seen in the setting of idiopathic normal pressure hydrocephalus. No extra-axial collection. No abnormal foci of susceptibility. No mass or midline shift. Vascular: Normal flow voids. Skull and upper cervical spine: Normal marrow signal. Sinuses/Orbits: Unremarkable. Other: None. IMPRESSION: 1. No acute intracranial process. 2. Disproportionate prominence of the ventricles relative to the sulci with elevated Evans index and acute callosal angle, which can be seen in the setting of idiopathic normal pressure hydrocephalus. Electronically Signed   By: Orvan Falconer M.D.   On: 10/07/2022 21:00   MR LUMBAR SPINE WO CONTRAST  Result Date: 10/07/2022 CLINICAL DATA:  Lower extremity weakness. EXAM: MRI LUMBAR SPINE WITHOUT CONTRAST TECHNIQUE: Multiplanar, multisequence MR imaging of the lumbar spine was performed. No intravenous contrast was administered. COMPARISON:  Lumbar spine radiographs 05/16/2021. Lumbar CT myelogram 01/31/2014. FINDINGS: Segmentation: Conventional numbering is assumed with 5 non-rib-bearing, lumbar type vertebral bodies. Alignment:  Normal. Vertebrae: Postoperative changes of L4-5 bilateral hemilaminotomy and posterior spinal fusion. Modic type 1 degenerative endplate marrow signal changes at L1-2 and L2-3. Conus medullaris and cauda equina: Conus extends to the L1-2 level. Conus and cauda  equina appear normal. Paraspinal and other soft tissues: Unremarkable. Disc levels: T12-L1:  Normal. L1-L2: Left eccentric disc bulge and facet arthropathy results in moderate left neural foraminal narrowing. No spinal canal stenosis. L2-L3: Disc bulge with superimposed left subarticular disc extrusion displaces the traversing left L3 nerve root in the left lateral recess and contributes along with facet arthropathy to moderate left neural  foraminal narrowing. L3-L4: Adjacent segment. Right eccentric disc bulge results in displacement of the traversing right L4 nerve root in the right lateral recess and mild right neural foraminal narrowing. L4-L5: Prior bilateral hemilaminotomy and posterior spinal fusion. No spinal canal stenosis or neural foraminal narrowing. L5-S1: Adjacent segment. Severe disc height loss. Facet arthropathy and endplate osteophytes contribute to moderate right neural foraminal narrowing. No spinal canal stenosis. IMPRESSION: 1. Prior L4-5 bilateral hemilaminotomy and posterior spinal fusion. Mild adjacent segment degeneration at the L3-4 segment, where there is displacement of the traversing right L4 nerve root in the right lateral recess. 2. At L2-L3, a left subarticular disc extrusion displaces the traversing left L3 nerve root in the left lateral recess and contributes along with facet arthropathy to moderate left neural foraminal narrowing. 3. At L5-S1, moderate right neural foraminal narrowing. Electronically Signed   By: Orvan Falconer M.D.   On: 10/07/2022 20:56   CT HEAD WO CONTRAST ( )  Result Date: 10/07/2022 CLINICAL DATA:  minor head trauma. Fall. Weakness. EXAM: CT HEAD WITHOUT CONTRAST TECHNIQUE: Contiguous axial images were obtained from the base of the skull through the vertex without intravenous contrast. RADIATION DOSE REDUCTION: This exam was performed according to the departmental dose-optimization program which includes automated exposure control, adjustment of the mA and/or kV according to patient size and/or use of iterative reconstruction technique. COMPARISON:  MRI brain 07/24/2022, images only with no available report. FINDINGS: Brain: There is moderately advanced cerebral atrophy with atrophic ventriculomegaly and moderate to severe small-vessel disease of the cerebral white matter. The cerebellum and brainstem are relatively normal. There are benign dural calcifications scattered along the falx.  No new asymmetry is seen worrisome for acute cortical based infarct, hemorrhage, mass, mass effect or midline shift. Vascular: There are calcifications in the carotid siphons. There are no hyperdense central vessels. Skull: No appreciable scalp hematoma. The calvarium, skull base and orbits are intact. No lytic lesions. Sinuses/Orbits: Old lens replacements. Otherwise negative orbits. Clear paranasal sinuses. Trace fluid both mastoid tips with otherwise clear mastoids. S shaped nasal septum. Other: There are bilateral wax impactions in the external auditory canals. IMPRESSION: 1. No acute intracranial CT findings or depressed skull fractures. 2. Chronic changes. 3. Bilateral wax impactions in the external auditory canals. Electronically Signed   By: Almira Bar M.D.   On: 10/07/2022 01:13   DG Chest 2 View  Result Date: 10/07/2022 CLINICAL DATA:  Weakness and shortness of breath. EXAM: CHEST - 2 VIEW COMPARISON:  Chest CT 12/07/2021 FINDINGS: There is patchy airspace disease in the posterior basal right lower lobe consistent with pneumonia. Follow-up study recommended after treatment to ensure clearing. The lungs hypoexpanded but otherwise clear. The cardiomediastinal silhouette and vascular pattern are normal. There is thoracic spondylosis. Thoracic cage is intact. IMPRESSION: Right lower lobe pneumonia. Follow-up study recommended after treatment to ensure clearing. Electronically Signed   By: Almira Bar M.D.   On: 10/07/2022 00:54   (Echo, Carotid, EGD, Colonoscopy, ERCP)    Subjective: Patient seen and examined.  No overnight events.  Sitting in couch.  Patient tells me his tremors and weakness is  better.  He will benefit with physical therapy.  Afebrile.  Denies any cough, sputum production or fever.  Denies any wheezing or shortness of breath.   Discharge Exam: Vitals:   10/08/22 0501 10/08/22 0805  BP: 128/83 126/71  Pulse: 75 80  Resp: 20   Temp: 98.3 F (36.8 C) 98.2 F (36.8 C)   SpO2: 95% 95%   Vitals:   10/07/22 0822 10/07/22 2104 10/08/22 0501 10/08/22 0805  BP: 128/63 139/79 128/83 126/71  Pulse: 87 71 75 80  Resp: 18 18 20    Temp: 98.8 F (37.1 C) 98.9 F (37.2 C) 98.3 F (36.8 C) 98.2 F (36.8 C)  TempSrc: Oral Oral  Oral  SpO2: 96% 96% 95% 95%  Weight:      Height:        General: Pt is alert, awake, not in acute distress Cardiovascular: RRR, S1/S2 +, no rubs, no gallops Respiratory: CTA bilaterally, no wheezing, no rhonchi Abdominal: Soft, NT, ND, bowel sounds + Extremities: no edema, no cyanosis There is no resting tremors.  There is no focal weakness.    The results of significant diagnostics from this hospitalization (including imaging, microbiology, ancillary and laboratory) are listed below for reference.     Microbiology: Recent Results (from the past 240 hour(s))  Blood culture (routine x 2)     Status: None (Preliminary result)   Collection Time: 10/07/22  1:15 AM   Specimen: BLOOD LEFT FOREARM  Result Value Ref Range Status   Specimen Description BLOOD LEFT FOREARM  Final   Special Requests   Final    BOTTLES DRAWN AEROBIC AND ANAEROBIC Blood Culture results may not be optimal due to an excessive volume of blood received in culture bottles   Culture   Final    NO GROWTH 1 DAY Performed at [facility], [address] [place], [place] [address]    Report Status PENDING  Incomplete  Blood culture (routine x 2)     Status: None (Preliminary result)   Collection Time: 10/07/22  1:17 AM   Specimen: BLOOD  Result Value Ref Range Status   Specimen Description BLOOD RIGHT ANTECUBITAL  Final   Special Requests   Final    BOTTLES DRAWN AEROBIC AND ANAEROBIC Blood Culture results may not be optimal due to an excessive volume of blood received in culture bottles   Culture   Final    NO GROWTH 1 DAY Performed at [facility], [address], [place], [place] [address]    Report Status PENDING  Incomplete  SARS  Coronavirus 2 by RT PCR (hospital order, performed in [facility] hospital lab) *cepheid single result test* Anterior Nasal Swab     Status: None   Collection Time: 10/07/22  4:20 PM   Specimen: Anterior Nasal Swab  Result Value Ref Range Status   SARS Coronavirus 2 by RT PCR NEGATIVE NEGATIVE Final    Comment: Performed at [facility], [address] [place], [place] [address]  Respiratory (~20 pathogens) panel by PCR     Status: None   Collection Time: 10/07/22  4:20 PM   Specimen: Nasopharyngeal Swab; Respiratory  Result Value Ref Range Status   Adenovirus NOT DETECTED NOT DETECTED Final   Coronavirus 229E NOT DETECTED NOT DETECTED Final    Comment: (NOTE) The Coronavirus on the Respiratory Panel, DOES NOT test for the novel  Coronavirus (2019 nCoV)    Coronavirus HKU1 NOT DETECTED NOT DETECTED Final   Coronavirus  NL63 NOT DETECTED NOT DETECTED Final   Coronavirus OC43 NOT DETECTED NOT DETECTED Final   Metapneumovirus NOT DETECTED NOT DETECTED Final   Rhinovirus / Enterovirus NOT DETECTED NOT DETECTED Final   Influenza A NOT DETECTED NOT DETECTED Final   Influenza B NOT DETECTED NOT DETECTED Final   Parainfluenza Virus 1 NOT DETECTED NOT DETECTED Final   Parainfluenza Virus 2 NOT DETECTED NOT DETECTED Final   Parainfluenza Virus 3 NOT DETECTED NOT DETECTED Final   Parainfluenza Virus 4 NOT DETECTED NOT DETECTED Final   Respiratory Syncytial Virus NOT DETECTED NOT DETECTED Final   Bordetella pertussis NOT DETECTED NOT DETECTED Final   Bordetella Parapertussis NOT DETECTED NOT DETECTED Final   Chlamydophila pneumoniae NOT DETECTED NOT DETECTED Final   Mycoplasma pneumoniae NOT DETECTED NOT DETECTED Final    Comment: Performed at [facility], [address], [place], [place] [address]     Labs: BNP (last 3 results) No results for input(s): "BNP" in the last 8760 hours. Basic Metabolic Panel: Recent Labs  Lab 10/07/22 0120 10/07/22 0618 10/08/22 0741  NA 134*  135 137  K 3.9 3.2* 3.9  CL 98 102 106  CO2 23 19* 22  GLUCOSE 90 85 93  BUN 13 11 9   CREATININE 1.22 1.12 1.01  CALCIUM 8.9 8.6* 8.9  MG  --  1.6*  --   PHOS  --  2.9  --    Liver Function Tests: Recent Labs  Lab 10/07/22 0120 10/07/22 0618 10/08/22 0741  AST 29 18 22   ALT 15 13 17   ALKPHOS 44 40 51  BILITOT 2.0* 1.4* 1.2  PROT 6.5 6.2* 6.9  ALBUMIN 3.1* 2.8* 3.0*   No results for input(s): "LIPASE", "AMYLASE" in the last 168 hours. No results for input(s): "AMMONIA" in the last 168 hours. CBC: Recent Labs  Lab 10/07/22 0120 10/07/22 0618 10/08/22 0741  WBC 7.4 6.9 5.4  NEUTROABS 5.5  --   --   HGB 11.3* 10.8* 11.5*  HCT 35.2* 34.0* 36.5*  MCV 93.1 93.7 92.2  PLT 175 161 205   Cardiac Enzymes: Recent Labs  Lab 10/07/22 0120  CKTOTAL 125   BNP: Invalid input(s): "POCBNP" CBG: Recent Labs  Lab 10/07/22 0400 10/07/22 0715 10/07/22 1111 10/07/22 1550 10/08/22 0756  GLUCAP 99 86 91 85 151*   D-Dimer No results for input(s): "DDIMER" in the last 72 hours. Hgb A1c No results for input(s): "HGBA1C" in the last 72 hours. Lipid Profile No results for input(s): "CHOL", "HDL", "LDLCALC", "TRIG", "CHOLHDL", "LDLDIRECT" in the last 72 hours. Thyroid function studies No results for input(s): "TSH", "T4TOTAL", "T3FREE", "THYROIDAB" in the last 72 hours.  Invalid input(s): "FREET3" Anemia work up No results for input(s): "VITAMINB12", "FOLATE", "FERRITIN", "TIBC", "IRON", "RETICCTPCT" in the last 72 hours. Urinalysis    Component Value Date/Time   COLORURINE YELLOW 10/07/2022 1753   APPEARANCEUR CLEAR 10/07/2022 1753   LABSPEC 1.021 10/07/2022 1753   PHURINE 5.0 10/07/2022 1753   GLUCOSEU >=500 (A) 10/07/2022 1753   HGBUR NEGATIVE 10/07/2022 1753   BILIRUBINUR NEGATIVE 10/07/2022 1753   KETONESUR 20 (A) 10/07/2022 1753   PROTEINUR 30 (A) 10/07/2022 1753   NITRITE NEGATIVE 10/07/2022 1753   LEUKOCYTESUR NEGATIVE 10/07/2022 1753   Sepsis Labs Recent  Labs  Lab 10/07/22 0120 10/07/22 0618 10/08/22 0741  WBC 7.4 6.9 5.4   Microbiology Recent Results (from the past 240 hour(s))  Blood culture (routine x 2)     Status: None (Preliminary result)  Collection Time: 10/07/22  1:15 AM   Specimen: BLOOD LEFT FOREARM  Result Value Ref Range Status   Specimen Description BLOOD LEFT FOREARM  Final   Special Requests   Final    BOTTLES DRAWN AEROBIC AND ANAEROBIC Blood Culture results may not be optimal due to an excessive volume of blood received in culture bottles   Culture   Final    NO GROWTH 1 DAY Performed at [facility], [address], [place], [place] [address]    Report Status PENDING  Incomplete  Blood culture (routine x 2)     Status: None (Preliminary result)   Collection Time: 10/07/22  1:17 AM   Specimen: BLOOD  Result Value Ref Range Status   Specimen Description BLOOD RIGHT ANTECUBITAL  Final   Special Requests   Final    BOTTLES DRAWN AEROBIC AND ANAEROBIC Blood Culture results may not be optimal due to an excessive volume of blood received in culture bottles   Culture   Final    NO GROWTH 1 DAY Performed at [facility], [address], [place], [place] [address]    Report Status PENDING  Incomplete  SARS Coronavirus 2 by RT PCR (hospital order, performed in [facility] hospital lab) *cepheid single result test* Anterior Nasal Swab     Status: None   Collection Time: 10/07/22  4:20 PM   Specimen: Anterior Nasal Swab  Result Value Ref Range Status   SARS Coronavirus 2 by RT PCR NEGATIVE NEGATIVE Final    Comment: Performed at [facility], [address] [place], [place] [address]  Respiratory (~20 pathogens) panel by PCR     Status: None   Collection Time: 10/07/22  4:20 PM   Specimen: Nasopharyngeal Swab; Respiratory  Result Value Ref Range Status   Adenovirus NOT DETECTED NOT DETECTED Final   Coronavirus 229E NOT DETECTED NOT DETECTED Final    Comment: (NOTE) The Coronavirus on the  Respiratory Panel, DOES NOT test for the novel  Coronavirus (2019 nCoV)    Coronavirus HKU1 NOT DETECTED NOT DETECTED Final   Coronavirus NL63 NOT DETECTED NOT DETECTED Final   Coronavirus OC43 NOT DETECTED NOT DETECTED Final   Metapneumovirus NOT DETECTED NOT DETECTED Final   Rhinovirus / Enterovirus NOT DETECTED NOT DETECTED Final   Influenza A NOT DETECTED NOT DETECTED Final   Influenza B NOT DETECTED NOT DETECTED Final   Parainfluenza Virus 1 NOT DETECTED NOT DETECTED Final   Parainfluenza Virus 2 NOT DETECTED NOT DETECTED Final   Parainfluenza Virus 3 NOT DETECTED NOT DETECTED Final   Parainfluenza Virus 4 NOT DETECTED NOT DETECTED Final   Respiratory Syncytial Virus NOT DETECTED NOT DETECTED Final   Bordetella pertussis NOT DETECTED NOT DETECTED Final   Bordetella Parapertussis NOT DETECTED NOT DETECTED Final   Chlamydophila pneumoniae NOT DETECTED NOT DETECTED Final   Mycoplasma pneumoniae NOT DETECTED NOT DETECTED Final    Comment: Performed at [facility], [address] [place], [place] [address]     Time coordinating discharge: 35 minutes  SIGNED:   Dorcas Carrow, MD  [facility] 10/08/2022, 10:54 AM

## 2022-10-08 NOTE — Evaluation (Signed)
Occupational Therapy Evaluation Patient Details Name: Tyler Hayes MRN: [id number] DOB: Feb 22, 1946 Today's Date: 10/08/2022   History of Present Illness 77 y.o. male presents to [facility] hospital on 10/06/2022 with generalized weakness and recent fall.  Chest xray concerning for RLL PNA. Pending MRI brain and spine. PMH includes Parkinson's disease, mild cognitive impairment, HTN, HLD.   Clinical Impression   At baseline, pt is Independent to Mod I with ADLs, functional mobility/transfers without an AD, and drives. Pt presents with decreased B UE general strength, B fine motor control, balance during functional tasks, activity tolerance, and safety and independence with ADLs. Pt currently performs UB ADLs Independent to Min assist, LB ADLs with with Min guard assist, and functional mobility with a RW (2 wheel) with Min guard assist. Pt currently requires occasional verbal cues for safety, sequencing, and compensatory techniques during ADLs and functional mobility and transfer. Pt will benefit from acute skilled OT services in address deficits outlined below and increase safety and independence with ADLs and functional mobility/transfers. Post acute discharge, pt will benefit from outpatient skilled OT services to achieve maximum rehab potential.      Recommendations for follow up therapy are one component of a multi-disciplinary discharge planning process, led by the attending physician.  Recommendations may be updated based on patient status, additional functional criteria and insurance authorization.   Assistance Recommended at Discharge Frequent or constant Supervision/Assistance  Patient can return home with the following A little help with walking and/or transfers;A little help with bathing/dressing/bathroom;Assistance with cooking/housework;Direct supervision/assist for medications management;Direct supervision/assist for financial management;Assist for transportation;Help with stairs or ramp  for entrance    Functional Status Assessment  Patient has had a recent decline in their functional status and demonstrates the ability to make significant improvements in function in a reasonable and predictable amount of time.  Equipment Recommendations  None recommended by OT    Recommendations for Other Services       Precautions / Restrictions Precautions Precautions: Fall Precaution Comments: Parkinson's disease Restrictions Weight Bearing Restrictions: No      Mobility Bed Mobility Overal bed mobility: Modified Independent Bed Mobility: Supine to Sit, Sit to Supine     Supine to sit: Modified independent (Device/Increase time) Sit to supine: Modified independent (Device/Increase time)        Transfers Overall transfer level: Needs assistance Equipment used: Rolling walker (2 wheels) Transfers: Sit to/from Stand, Bed to chair/wheelchair/BSC Sit to Stand: Supervision     Step pivot transfers: Min guard     General transfer comment: verbal cues for hand placement      Balance Overall balance assessment: Needs assistance Sitting-balance support: No upper extremity supported, Feet supported Sitting balance-Leahy Scale: Good   Postural control: Posterior lean (able to self correct with verbal cue) Standing balance support: Single extremity supported, No upper extremity supported, During functional activity Standing balance-Leahy Scale: Fair Standing balance comment: Requires intermitent unilateral UE support to maintain balance during LB ADLs sit/stand                           ADL either performed or assessed with clinical judgement   ADL Overall ADL's : Needs assistance/impaired Eating/Feeding: Independent;Sitting   Grooming: Modified independent;Cueing for sequencing;Sitting   Upper Body Bathing: Min guard;Cueing for sequencing;Sitting   Lower Body Bathing: Min guard;Sit to/from stand;Cueing for sequencing;Cueing for safety (using RW for  intermitent unilateral UE support)   Upper Body Dressing : Minimal assistance;Sitting;Cueing for sequencing  Lower Body Dressing: Min guard;Cueing for safety;Cueing for sequencing;Cueing for compensatory techniques;Sit to/from stand   Toilet Transfer: Min guard;Rolling walker (2 wheels);Comfort height toilet;Grab bars;Cueing for safety;Cueing for sequencing (Ambulating)   Toileting- Clothing Manipulation and Hygiene: Min guard;Cueing for safety;Cueing for sequencing;Cueing for compensatory techniques;Sit to/from stand (with use of grab bars or RW for intermittent unilateral UE support)       Functional mobility during ADLs: Min guard;Rolling walker (2 wheels);Cueing for safety;Cueing for sequencing       Vision Baseline Vision/History: 1 Wears glasses (Readers) Ability to See in Adequate Light: 0 Adequate Patient Visual Report: No change from baseline Vision Assessment?: No apparent visual deficits     Perception     Praxis Praxis Praxis tested?: Deficits Deficits: Organization Praxis-Other Comments: Mild sequencing deficits    Pertinent Vitals/Pain Pain Assessment Pain Assessment: No/denies pain     Hand Dominance Right   Extremity/Trunk Assessment Upper Extremity Assessment Upper Extremity Assessment: Generalized weakness;RUE deficits/detail;LUE deficits/detail RUE Deficits / Details: Generalized weakness. Occasional R side tremor RUE Sensation: WNL RUE Coordination: decreased fine motor LUE Deficits / Details: generalized weakness LUE Sensation: WNL LUE Coordination: decreased fine motor   Lower Extremity Assessment Lower Extremity Assessment: Overall WFL for tasks assessed   Cervical / Trunk Assessment Cervical / Trunk Assessment: Normal   Communication Communication Communication: No difficulties   Cognition Arousal/Alertness: Awake/alert Behavior During Therapy: WFL for tasks assessed/performed Overall Cognitive Status: Impaired/Different from  baseline Area of Impairment: Safety/judgement, Problem solving                         Safety/Judgement: Decreased awareness of safety   Problem Solving: Slow processing, Difficulty sequencing, Requires verbal cues       General Comments  VSS on RA. Pleasant throughout session.    Exercises     Shoulder Instructions      Home Living Family/patient expects to be discharged to:: Private residence Living Arrangements: Spouse/significant other Available Help at Discharge: Family;Available 24 hours/day Type of Home: House Home Access: Stairs to enter [employer] of Steps: 2 (Pt reports 2 at front door and 3-4 steps from garage into home. Pt reports he typically uses the garage door.) Entrance Stairs-Rails: Left Home Layout: Multi-level;Able to live on main level with bedroom/bathroom Alternate Level Stairs-Number of Steps: Flight   Bathroom Shower/Tub: Producer, television/film/video: Handicapped height Bathroom Accessibility: Yes How Accessible: Accessible via walker Home Equipment: Agricultural consultant (2 wheels);Rollator (4 wheels);Cane - single point;Grab bars - tub/shower;BSC/3in1;Shower seat - built in          Prior Functioning/Environment Prior Level of Function : Independent/Modified Independent;Driving             Mobility Comments: typically ambulatory without DME, works with a Systems analyst at a [employer] multiple times a week ADLs Comments: Previously Independent to [employer] I with all ADLs.        OT Problem List: Decreased strength;Decreased activity tolerance;Impaired balance (sitting and/or standing);Decreased coordination;Decreased safety awareness;Decreased knowledge of use of DME or AE;Decreased knowledge of precautions      OT Treatment/Interventions: Self-care/ADL training;Therapeutic exercise;Neuromuscular education;Energy conservation;DME and/or AE instruction;Therapeutic activities;Patient/family education;Balance training     OT Goals(Current goals can be found in the care plan section) Acute Rehab OT Goals Patient Stated Goal: To return home with his wife OT Goal Formulation: With patient Time For Goal Achievement: 10/22/22 Potential to Achieve Goals: Good ADL Goals Pt Will Perform Lower Body Bathing: with modified independence;sit  to/from stand Pt Will Perform Lower Body Dressing: with modified independence;sit to/from stand Pt Will Transfer to Toilet: with modified independence;ambulating (comfort height toilet) Pt Will Perform Toileting - Clothing Manipulation and hygiene: with modified independence;sit to/from stand  OT Frequency: Min 2X/week    Co-evaluation              AM-PAC OT "6 Clicks" Daily Activity     Outcome Measure Help from another person eating meals?: None Help from another person taking care of personal grooming?: None Help from another person toileting, which includes using toliet, bedpan, or urinal?: A Little Help from another person bathing (including washing, rinsing, drying)?: A Little Help from another person to put on and taking off regular upper body clothing?: A Little Help from another person to put on and taking off regular lower body clothing?: A Little 6 Click Score: 20   End of Session Equipment Utilized During Treatment: Gait belt;Rolling walker (2 wheels) Nurse Communication: Mobility status  Activity Tolerance: Patient tolerated treatment well Patient left: in chair;with call bell/phone within reach;with chair alarm set  OT Visit Diagnosis: Unsteadiness on feet (R26.81);Other abnormalities of gait and mobility (R26.89);Muscle weakness (generalized) (M62.81);Other symptoms and signs involving the nervous system (R29.898);History of falling (Z91.81)                Time: 1610-9604 OT Time Calculation (min): 39 min Charges:  OT General Charges $OT Visit: 1 Visit OT Evaluation $OT Eval Moderate Complexity: 1 Mod OT Treatments $Self Care/Home Management : 8-22  mins  Lenis Nettleton "Orson Eva., OTR/L, MA Acute Rehab [phone]   Lendon Colonel 10/08/2022, 11:55 AM

## 2022-10-08 NOTE — Plan of Care (Signed)
  Problem: Clinical Measurements: Goal: Ability to maintain a body temperature in the normal range will improve Outcome: Adequate for Discharge   Problem: Health Behavior/Discharge Planning: Goal: Ability to manage health-related needs will improve Outcome: Adequate for Discharge   Problem: Clinical Measurements: Goal: Ability to maintain clinical measurements within normal limits will improve Outcome: Adequate for Discharge Goal: Will remain free from infection Outcome: Adequate for Discharge Goal: Diagnostic test results will improve Outcome: Adequate for Discharge Goal: Respiratory complications will improve Outcome: Adequate for Discharge Goal: Cardiovascular complication will be avoided Outcome: Adequate for Discharge   Problem: Activity: Goal: Risk for activity intolerance will decrease Outcome: Adequate for Discharge   Problem: Nutrition: Goal: Adequate nutrition will be maintained Outcome: Adequate for Discharge   Problem: Coping: Goal: Level of anxiety will decrease Outcome: Adequate for Discharge   Problem: Elimination: Goal: Will not experience complications related to bowel motility Outcome: Adequate for Discharge Goal: Will not experience complications related to urinary retention Outcome: Adequate for Discharge   Problem: Pain Managment: Goal: General experience of comfort will improve Outcome: Adequate for Discharge   Problem: Safety: Goal: Ability to remain free from injury will improve Outcome: Adequate for Discharge   Problem: Skin Integrity: Goal: Risk for impaired skin integrity will decrease Outcome: Adequate for Discharge

## 2022-10-08 NOTE — TOC Transition Note (Signed)
Transition of Care [facility]) - CM/SW Discharge Note   Patient Details  Name: Tyler Hayes MRN: [id number] Date of Birth: [birth year]  [facility] of [facility] CM/SW Contact:  Tom-Johnson, Hershal Coria, RN Phone Number: 10/08/2022, 12:25 PM   Clinical Narrative:     Patient is scheduled for discharge today.  Readmission Risk Assessment done. Outpatient PT/OT referral sent to [facility] per wife's request. Info on AVS. Hospital f/u and discharge instructions also on AVS. Wife, Malachi Bonds to transport at discharge.  No further TOC needs noted.       Final next level of care: OP Rehab Barriers to Discharge: Barriers Resolved   Patient Goals and CMS Choice CMS Medicare.gov Compare Post Acute Care list provided to:: Patient Choice offered to / list presented to : Patient, Spouse  Discharge Placement                  Patient to be transferred to facility by: Wife Name of family member notified: Malachi Bonds    Discharge Plan and Services Additional resources added to the After Visit Summary for     Discharge Planning Services: CM Consult            DME Arranged: N/A DME Agency: NA       HH Arranged: NA HH Agency: NA        Social Determinants of Health (SDOH) Interventions SDOH Screenings   Food Insecurity: No Food Insecurity (10/07/2022)  Housing: Low Risk  (10/07/2022)  Transportation Needs: No Transportation Needs (10/07/2022)  Utilities: Not At Risk (10/07/2022)  Tobacco Use: Medium Risk (10/07/2022)     Readmission Risk Interventions    10/07/2022    1:43 PM  Readmission Risk Prevention Plan  Transportation Screening Complete  PCP or Specialist Appt within 5-7 Days Complete  Home Care Screening Complete  Medication Review (RN CM) Referral to Pharmacy

## 2022-10-09 ENCOUNTER — Ambulatory Visit: Payer: Medicare Other | Admitting: Podiatry

## 2022-10-09 LAB — LEGIONELLA PNEUMOPHILA SEROGP 1 UR AG: L. pneumophila Serogp 1 Ur Ag: NEGATIVE

## 2022-10-09 LAB — CULTURE, BLOOD (ROUTINE X 2)

## 2022-10-10 ENCOUNTER — Ambulatory Visit: Payer: Medicare Other | Admitting: Podiatry

## 2022-10-11 ENCOUNTER — Encounter: Payer: Self-pay | Admitting: Neurology

## 2022-10-11 LAB — CULTURE, BLOOD (ROUTINE X 2): Culture: NO GROWTH

## 2022-10-12 LAB — CULTURE, BLOOD (ROUTINE X 2)

## 2022-10-13 DIAGNOSIS — G4752 REM sleep behavior disorder: Secondary | ICD-10-CM | POA: Diagnosis not present

## 2022-10-13 DIAGNOSIS — N1831 Chronic kidney disease, stage 3a: Secondary | ICD-10-CM | POA: Diagnosis not present

## 2022-10-13 DIAGNOSIS — I2510 Atherosclerotic heart disease of native coronary artery without angina pectoris: Secondary | ICD-10-CM | POA: Diagnosis not present

## 2022-10-13 DIAGNOSIS — E1122 Type 2 diabetes mellitus with diabetic chronic kidney disease: Secondary | ICD-10-CM | POA: Diagnosis not present

## 2022-10-13 DIAGNOSIS — I129 Hypertensive chronic kidney disease with stage 1 through stage 4 chronic kidney disease, or unspecified chronic kidney disease: Secondary | ICD-10-CM | POA: Diagnosis not present

## 2022-10-13 DIAGNOSIS — M069 Rheumatoid arthritis, unspecified: Secondary | ICD-10-CM | POA: Diagnosis not present

## 2022-10-13 DIAGNOSIS — E7800 Pure hypercholesterolemia, unspecified: Secondary | ICD-10-CM | POA: Diagnosis not present

## 2022-10-13 DIAGNOSIS — K649 Unspecified hemorrhoids: Secondary | ICD-10-CM | POA: Diagnosis not present

## 2022-10-13 DIAGNOSIS — I428 Other cardiomyopathies: Secondary | ICD-10-CM | POA: Diagnosis not present

## 2022-10-13 DIAGNOSIS — G20A1 Parkinson's disease without dyskinesia, without mention of fluctuations: Secondary | ICD-10-CM | POA: Diagnosis not present

## 2022-10-13 DIAGNOSIS — M47814 Spondylosis without myelopathy or radiculopathy, thoracic region: Secondary | ICD-10-CM | POA: Diagnosis not present

## 2022-10-13 DIAGNOSIS — J45909 Unspecified asthma, uncomplicated: Secondary | ICD-10-CM | POA: Diagnosis not present

## 2022-10-13 DIAGNOSIS — Z981 Arthrodesis status: Secondary | ICD-10-CM | POA: Diagnosis not present

## 2022-10-13 DIAGNOSIS — Z7984 Long term (current) use of oral hypoglycemic drugs: Secondary | ICD-10-CM | POA: Diagnosis not present

## 2022-10-13 DIAGNOSIS — Z8546 Personal history of malignant neoplasm of prostate: Secondary | ICD-10-CM | POA: Diagnosis not present

## 2022-10-13 DIAGNOSIS — Z9181 History of falling: Secondary | ICD-10-CM | POA: Diagnosis not present

## 2022-10-13 DIAGNOSIS — K5790 Diverticulosis of intestine, part unspecified, without perforation or abscess without bleeding: Secondary | ICD-10-CM | POA: Diagnosis not present

## 2022-10-13 DIAGNOSIS — R41841 Cognitive communication deficit: Secondary | ICD-10-CM | POA: Diagnosis not present

## 2022-10-13 DIAGNOSIS — M170 Bilateral primary osteoarthritis of knee: Secondary | ICD-10-CM | POA: Diagnosis not present

## 2022-10-13 DIAGNOSIS — M47816 Spondylosis without myelopathy or radiculopathy, lumbar region: Secondary | ICD-10-CM | POA: Diagnosis not present

## 2022-10-13 DIAGNOSIS — Z7982 Long term (current) use of aspirin: Secondary | ICD-10-CM | POA: Diagnosis not present

## 2022-10-13 DIAGNOSIS — J189 Pneumonia, unspecified organism: Secondary | ICD-10-CM | POA: Diagnosis not present

## 2022-10-15 DIAGNOSIS — E7800 Pure hypercholesterolemia, unspecified: Secondary | ICD-10-CM | POA: Diagnosis not present

## 2022-10-15 DIAGNOSIS — Z09 Encounter for follow-up examination after completed treatment for conditions other than malignant neoplasm: Secondary | ICD-10-CM | POA: Diagnosis not present

## 2022-10-15 DIAGNOSIS — R413 Other amnesia: Secondary | ICD-10-CM | POA: Diagnosis not present

## 2022-10-15 DIAGNOSIS — E1169 Type 2 diabetes mellitus with other specified complication: Secondary | ICD-10-CM | POA: Diagnosis not present

## 2022-10-15 DIAGNOSIS — I428 Other cardiomyopathies: Secondary | ICD-10-CM | POA: Diagnosis not present

## 2022-10-15 DIAGNOSIS — I10 Essential (primary) hypertension: Secondary | ICD-10-CM | POA: Diagnosis not present

## 2022-10-16 ENCOUNTER — Other Ambulatory Visit: Payer: Self-pay

## 2022-10-16 ENCOUNTER — Other Ambulatory Visit: Payer: Self-pay | Admitting: Diagnostic Neuroimaging

## 2022-10-16 DIAGNOSIS — E1122 Type 2 diabetes mellitus with diabetic chronic kidney disease: Secondary | ICD-10-CM | POA: Diagnosis not present

## 2022-10-16 DIAGNOSIS — I129 Hypertensive chronic kidney disease with stage 1 through stage 4 chronic kidney disease, or unspecified chronic kidney disease: Secondary | ICD-10-CM | POA: Diagnosis not present

## 2022-10-16 DIAGNOSIS — G20A1 Parkinson's disease without dyskinesia, without mention of fluctuations: Secondary | ICD-10-CM | POA: Diagnosis not present

## 2022-10-16 DIAGNOSIS — N1831 Chronic kidney disease, stage 3a: Secondary | ICD-10-CM | POA: Diagnosis not present

## 2022-10-16 DIAGNOSIS — J189 Pneumonia, unspecified organism: Secondary | ICD-10-CM | POA: Diagnosis not present

## 2022-10-16 DIAGNOSIS — M170 Bilateral primary osteoarthritis of knee: Secondary | ICD-10-CM | POA: Diagnosis not present

## 2022-10-16 MED ORDER — MEMANTINE HCL 28 X 5 MG & 21 X 10 MG PO TABS: ORAL_TABLET | ORAL | 0 refills | Status: DC

## 2022-10-24 DIAGNOSIS — M170 Bilateral primary osteoarthritis of knee: Secondary | ICD-10-CM | POA: Diagnosis not present

## 2022-10-24 DIAGNOSIS — G20A1 Parkinson's disease without dyskinesia, without mention of fluctuations: Secondary | ICD-10-CM | POA: Diagnosis not present

## 2022-10-24 DIAGNOSIS — I129 Hypertensive chronic kidney disease with stage 1 through stage 4 chronic kidney disease, or unspecified chronic kidney disease: Secondary | ICD-10-CM | POA: Diagnosis not present

## 2022-10-24 DIAGNOSIS — N1831 Chronic kidney disease, stage 3a: Secondary | ICD-10-CM | POA: Diagnosis not present

## 2022-10-24 DIAGNOSIS — E1122 Type 2 diabetes mellitus with diabetic chronic kidney disease: Secondary | ICD-10-CM | POA: Diagnosis not present

## 2022-10-24 DIAGNOSIS — J189 Pneumonia, unspecified organism: Secondary | ICD-10-CM | POA: Diagnosis not present

## 2022-10-28 DIAGNOSIS — M0609 Rheumatoid arthritis without rheumatoid factor, multiple sites: Secondary | ICD-10-CM | POA: Diagnosis not present

## 2022-10-29 DIAGNOSIS — M170 Bilateral primary osteoarthritis of knee: Secondary | ICD-10-CM | POA: Diagnosis not present

## 2022-10-29 DIAGNOSIS — J189 Pneumonia, unspecified organism: Secondary | ICD-10-CM | POA: Diagnosis not present

## 2022-10-29 DIAGNOSIS — G20C Parkinsonism, unspecified: Secondary | ICD-10-CM | POA: Diagnosis not present

## 2022-10-29 DIAGNOSIS — I129 Hypertensive chronic kidney disease with stage 1 through stage 4 chronic kidney disease, or unspecified chronic kidney disease: Secondary | ICD-10-CM | POA: Diagnosis not present

## 2022-11-05 DIAGNOSIS — I10 Essential (primary) hypertension: Secondary | ICD-10-CM | POA: Diagnosis not present

## 2022-11-05 DIAGNOSIS — Z8701 Personal history of pneumonia (recurrent): Secondary | ICD-10-CM | POA: Diagnosis not present

## 2022-11-05 DIAGNOSIS — E118 Type 2 diabetes mellitus with unspecified complications: Secondary | ICD-10-CM | POA: Diagnosis not present

## 2022-11-05 DIAGNOSIS — G20C Parkinsonism, unspecified: Secondary | ICD-10-CM | POA: Diagnosis not present

## 2022-11-11 ENCOUNTER — Other Ambulatory Visit: Payer: Self-pay | Admitting: Diagnostic Neuroimaging

## 2022-11-12 NOTE — Telephone Encounter (Signed)
Pt's wife called wanting to know when this request will be approved for the pt so that they can pick it up tonight.

## 2022-11-14 ENCOUNTER — Encounter: Payer: Self-pay | Admitting: Anesthesiology

## 2022-11-14 ENCOUNTER — Other Ambulatory Visit: Payer: Self-pay | Admitting: Anesthesiology

## 2022-11-14 MED ORDER — MEMANTINE HCL 10 MG PO TABS: 10.0000 mg | ORAL_TABLET | Freq: Two times a day (BID) | ORAL | 1 refills | Status: DC

## 2022-11-19 ENCOUNTER — Ambulatory Visit (HOSPITAL_COMMUNITY): Payer: Medicare Other

## 2022-11-20 ENCOUNTER — Ambulatory Visit (HOSPITAL_COMMUNITY)
Admission: RE | Admit: 2022-11-20 | Discharge: 2022-11-20 | Disposition: A | Discharge status: Home / Self Care | Payer: Medicare Other | Source: Ambulatory Visit | Attending: Pulmonary Disease | Admitting: Pulmonary Disease

## 2022-11-20 DIAGNOSIS — J984 Other disorders of lung: Secondary | ICD-10-CM | POA: Diagnosis not present

## 2022-11-20 DIAGNOSIS — R918 Other nonspecific abnormal finding of lung field: Secondary | ICD-10-CM

## 2022-11-20 DIAGNOSIS — R911 Solitary pulmonary nodule: Secondary | ICD-10-CM

## 2022-11-20 DIAGNOSIS — I700 Atherosclerosis of aorta: Secondary | ICD-10-CM | POA: Diagnosis not present

## 2022-11-26 DIAGNOSIS — M0609 Rheumatoid arthritis without rheumatoid factor, multiple sites: Secondary | ICD-10-CM | POA: Diagnosis not present

## 2022-11-27 ENCOUNTER — Ambulatory Visit (INDEPENDENT_AMBULATORY_CARE_PROVIDER_SITE_OTHER): Payer: Medicare Other | Admitting: Neurology

## 2022-11-27 ENCOUNTER — Encounter: Payer: Self-pay | Admitting: Neurology

## 2022-11-27 VITALS — BP 127/77 | HR 78 | Ht 70.0 in | Wt 170.0 lb

## 2022-11-27 DIAGNOSIS — G4752 REM sleep behavior disorder: Secondary | ICD-10-CM

## 2022-11-27 DIAGNOSIS — R490 Dysphonia: Secondary | ICD-10-CM | POA: Diagnosis not present

## 2022-11-27 DIAGNOSIS — G3184 Mild cognitive impairment, so stated: Secondary | ICD-10-CM | POA: Diagnosis not present

## 2022-11-27 DIAGNOSIS — G20C Parkinsonism, unspecified: Secondary | ICD-10-CM

## 2022-11-27 MED ORDER — CLONAZEPAM 0.5 MG PO TABS: ORAL_TABLET | ORAL | 5 refills | Status: AC

## 2022-11-27 MED ORDER — MEMANTINE HCL 10 MG PO TABS: 10.0000 mg | ORAL_TABLET | Freq: Two times a day (BID) | ORAL | 1 refills | Status: DC

## 2022-11-27 MED ORDER — CARBIDOPA-LEVODOPA 25-100 MG PO TABS: 1.0000 | ORAL_TABLET | Freq: Three times a day (TID) | ORAL | 5 refills | Status: DC

## 2022-11-27 NOTE — Progress Notes (Signed)
Provider:  Melvyn Novas, MD  Primary Care Physician:  Merri Brunette, MD [address] [place] [address] [place] [address]     Referring Provider: Merri Brunette, Md [address] [place],  [place] [address]          Chief Complaint according to patient   Patient presents with:     Patient states he was in the hospital for Pneumonia recently.            HISTORY OF PRESENT ILLNESS:  Tyler Hayes is a 77 y.o. male patient who is here for revisit 11/27/2022 for  PD follow-up. He also has rheumatoid arthritis.    Chief concern according to patient :  Parkinson disease dx in 2017-18, first manifested as REM BD. He noted this time he had taken a shower,  was in heat and humidity and felt weak. He felt too weak to safely leave the home, walked into the bedroom, wanted to sit down and missed the seat- fell to the floor.  EMS called as he could not rise and his wife cannot lift him either-, hospitalized for 2 days with pneumonia.  10-06-2022 in ED - admitted on 5-21 until 5-23.   "Tyler Hayes is a 77 y.o. male.   The history is provided by the patient and medical records.   Interval decrease in size of the previously demonstrated ground-glass opacity in the right middle lobe, now with a linear configuration, compatible with a small area of scarring. 2. Interval development of multiple ill-defined areas of ground-glass opacity in the right lower lobe, some with an areas of tree in bud densities that were previously present. These findings are most consistent with an infectious or inflammatory process. 3. Mild calcific coronary artery and aortic atherosclerosis.   Weakness   77 year old male with history of hypertension, essential tremor, rheumatoid arthritis, hyperlipidemia, NPH, Parkinson's disease, chronic kidney disease, presenting to the ED with generalized weakness.  Sustained a fall at home 4 days ago in which she got out of the  shower and his legs became weak and gave out on him.  He was attempting to sit down on the toilet but missed and slid to the floor.  He did strike his head on a nearby chair but there was no loss of consciousness.  Wife was able to eventually get him up, however has been increasingly weak since that time.  He describes it as "I feel like I do not have any strength".  His appetite has also been poor, neurology did adjust some of his medications as they felt like that may have been contributing but has still not been eating and drinking very much.  He has not had any vomiting or diarrhea.  He denies any current urinary symptoms.  He denies any focal numbness or weakness.  He has not had any further falls.  Wife has been trying to get him to use a walker over the past few days, however but still only able to take a few steps before his legs give out from weakness, generally ambulates unassisted.'  Tyler Hayes is a pleasant 77 y.o. male with medical history significant for Parkinson disease, mild cognitive impairment, hypertension, hyperlipidemia, and elevated coronary calcium score who presents to the emergency department with generalized weakness and fatigue.   Patient reports that he became generally weak with increased fatigue 4 or 5 days ago, slid to the ground while attempting to ambulate with  his walker few days ago, and has continued to have difficulty with his usual activities due to profound fatigue and generalized weakness.  He has not felt particularly short of breath and has not been coughing much.  He has not noticed any fever or chills.  He reports experiencing these same symptoms a few years ago when he was found to have pneumonia, and notes that he returned to baseline with treatment of the pneumonia.   ED Course: Upon arrival to the ED, patient is found to be afebrile and saturating mid 90s on room air with normal heart rate and stable blood pressure.  Chest x-ray is concerning for right  lower lobe pneumonia.  Head CT is negative for acute findings.    Blood cultures were collected in the ED and the patient was treated with 500 mL of saline, Rocephin, and azithromycin.    Review of Systems: Out of a complete 14 system review, the patient complains of only the following symptoms, and all other reviewed systems are negative.:  Fatigue, sleepiness , snoring,REM BD, PD, tremor. Dysphonia.       11/27/2022    9:35 AM 07/11/2022    9:40 AM 01/08/2022    9:30 AM 09/04/2021   10:22 AM 08/29/2020    9:11 AM  Montreal Cognitive Assessment   Visuospatial/ Executive (0/5) 4 4 4 5 2   Naming (0/3) 3 3 3 3 2   Attention: Read list of digits (0/2) 1 2 2 2 1   Attention: Read list of letters (0/1) 1 1 1 1  0  Attention: Serial 7 subtraction starting at 100 (0/3) 3 3 3 3 3   Language: Repeat phrase (0/2) 2 1 1 1 1   Language : Fluency (0/1) 1 1 1  0 1  Abstraction (0/2) 2 2 2 2 2   Delayed Recall (0/5) 2 1 1 1 4   Orientation (0/6) 6 6 6 6 6   Total 25 24 24 24 22   Adjusted Score (based on education)    24     Social History   Socioeconomic History   Marital status: Married    Spouse name: Not on file   Number of children: 3   Years of education: Not on file   Highest education level: Not on file  Occupational History   Not on file  Tobacco Use   Smoking status: Former    Packs/day: 1.50    Years: 20.00    Additional pack years: 0.00    Total pack years: 30.00    Types: Cigarettes    Quit date: 02/01/1991    Years since quitting: 31.8   Smokeless tobacco: Never  Vaping Use   Vaping Use: Never used  Substance and Sexual Activity   Alcohol use: Yes    Comment: occasionally   Drug use: No   Sexual activity: Not on file  Other Topics Concern   Not on file  Social History Narrative   Not on file   Social Determinants of Health   Financial Resource Strain: Not on file  Food Insecurity: No Food Insecurity (10/07/2022)   Hunger Vital Sign    Worried About Running Out of Food  in the Last Year: Never true    Ran Out of Food in the Last Year: Never true  Transportation Needs: No Transportation Needs (10/07/2022)   PRAPARE - Administrator, Civil Service (Medical): No    Lack of Transportation (Non-Medical): No  Physical Activity: Not on file  Stress: Not on file  Social  Connections: Not on file    Family History  Problem Relation Age of Onset   Heart failure Mother    CAD Mother    CAD Sister        Brain tumor   Stroke Sister    Cancer Father    Stroke Sister    Coronary artery disease Sister    Hypertension Son    Diabetes Son    Obesity Son    Sleep apnea Son    Heart murmur Son        No major structural issues noted.    Past Medical History:  Diagnosis Date   Arthritis    back   Cancer [facility])    prostate   Diabetes mellitus without complication (HCC)    on meds   Diverticulosis    Hypercholesteremia    Hypertension    Meatal stenosis    Osteoarthritis    Sinus drainage    Tinea pedis     Past Surgical History:  Procedure Laterality Date   BACK SURGERY  1984, 2013   lumbar   COLONOSCOPY  06/10/2019   Dr. Bosie Clos   DG DILATION URETERS  2003   EYE SURGERY Bilateral 1999   Lasik   LEFT HEART CATH AND CORONARY ANGIOGRAPHY N/A 05/26/2018   Procedure: LEFT HEART CATH AND CORONARY ANGIOGRAPHY;  Surgeon: Elder Negus, MD;  Location: [facility] INVASIVE CV LAB;  Service: Cardiovascular;  Laterality: N/A;   POSTERIOR LUMBAR FUSION 4 LEVEL N/A 06/01/2014   Procedure: LUMBAR THREE TO FOUR, LUMBAR FOUR TO FIVE LAMINECTOMY,  RIGHT LUMBAR FUSION AT LUMBAR FOUR TO FIVE;  Surgeon: Karn Cassis, MD;  Location: [facility] NEURO ORS;  Service: Neurosurgery;  Laterality: N/A;  POSSIBLE L2-3 L3-4 L4-5 L5-S1 POSTERIOR LUMBAR INTERBODY FUSION   PROSTATECTOMY  2002     Current Outpatient Medications on File Prior to Visit  Medication Sig Dispense Refill   Abatacept (ORENCIA IV) Inject into the vein. Gets every 4 weeks for awhile and then goes  to monthly infusions     albuterol (VENTOLIN HFA) 108 (90 Base) MCG/ACT inhaler Inhale 1-2 puffs into the lungs every 6 (six) hours as needed for wheezing or shortness of breath.     amLODipine (NORVASC) 2.5 MG tablet Take 2.5 mg by mouth daily.     aspirin 81 MG chewable tablet Chew 81 mg by mouth daily.     atorvastatin (LIPITOR) 80 MG tablet Take 1 tablet (80 mg total) by mouth daily. 90 tablet 3   carbidopa-levodopa (SINEMET IR) 25-100 MG tablet TAKE 1 TABLET 3 TIMES A DAYWITH WATER 30 MINUTES      BEFORE MEALS (Patient taking differently: Take 1 tablet by mouth 3 (three) times daily. TAKE 1 TABLET 3 TIMES A DAYWITH WATER 30 MINUTES      BEFORE MEALS) 270 tablet 3   carvedilol (COREG) 6.25 MG tablet Take 1 tablet (6.25 mg total) by mouth 2 (two) times daily. 180 tablet 3   Cholecalciferol 50 MCG (2000 UT) TABS Take 2,000 Units by mouth daily.     clonazePAM (KLONOPIN) 0.5 MG tablet At bedtime prn, use 1/2 tab and if needed add second half. (Patient taking differently: Take 0.5 mg by mouth at bedtime as needed for anxiety. At bedtime prn, use 1/2 tab and if needed add second half.) 30 tablet 5   fluticasone (FLONASE) 50 MCG/ACT nasal spray Place 1 spray into both nostrils daily as needed for rhinitis (drainage issues.).      KLOR-CON  M10 10 MEQ tablet Take 10 mEq by mouth daily.     leflunomide (ARAVA) 20 MG tablet Take 20 mg by mouth daily.     losartan (COZAAR) 100 MG tablet Take 100 mg by mouth at bedtime.     memantine (NAMENDA) 10 MG tablet Take 1 tablet (10 mg total) by mouth 2 (two) times daily. 180 tablet 1   montelukast (SINGULAIR) 10 MG tablet Take 10 mg by mouth every evening.     SYNJARDY XR 09-998 MG TB24 Take 2 tablets by mouth daily.      donepezil (ARICEPT) 5 MG tablet Take 1 tablet (5 mg total) by mouth 2 (two) times daily. (Patient not taking: Reported on 11/27/2022) 60 tablet 5   memantine (NAMENDA) 5 MG tablet Take 5 mg by mouth 2 (two) times daily.     No current  facility-administered medications on file prior to visit.    Allergies  Allergen Reactions   Other Anaphylaxis   Ace Inhibitors Cough    LISINOPRIL   Antihistamines, Diphenhydramine-Type Other (See Comments)    Other reaction(s): Unknown     DIAGNOSTIC DATA (LABS, IMAGING, TESTING) - I reviewed patient records, labs, notes, testing and imaging myself where available.  Lab Results  Component Value Date   WBC 5.4 10/08/2022   HGB 11.5 (L) 10/08/2022   HCT 36.5 (L) 10/08/2022   MCV 92.2 10/08/2022   PLT 205 10/08/2022      Component Value Date/Time   NA 137 10/08/2022 0741   NA 143 07/11/2022 1023   K 3.9 10/08/2022 0741   CL 106 10/08/2022 0741   CO2 22 10/08/2022 0741   GLUCOSE 93 10/08/2022 0741   BUN 9 10/08/2022 0741   BUN 10 07/11/2022 1023   CREATININE 1.01 10/08/2022 0741   CALCIUM 8.9 10/08/2022 0741   PROT 6.9 10/08/2022 0741   PROT 6.7 07/11/2022 1023   ALBUMIN 3.0 (L) 10/08/2022 0741   ALBUMIN 4.6 07/11/2022 1023   AST 22 10/08/2022 0741   ALT 17 10/08/2022 0741   ALKPHOS 51 10/08/2022 0741   BILITOT 1.2 10/08/2022 0741   BILITOT 0.4 07/11/2022 1023   GFRNONAA >60 10/08/2022 0741   GFRAA 72 (L) 05/24/2014 1221   Lab Results  Component Value Date   CHOL 148 11/07/2021   HDL 54 11/07/2021   LDLCALC 82 11/07/2021   TRIG 55 11/07/2021   CHOLHDL 2.4 02/06/2021   No results found for: "HGBA1C" No results found for: "VITAMINB12" No results found for: "TSH"  PHYSICAL EXAM:  Today's Vitals   11/27/22 0925  BP: 127/77  Pulse: 78  Weight: 170 lb (77.1 kg)  Height: 5\' 10"  (1.778 m)   Body mass index is 24.39 kg/m.   Wt Readings from Last 3 Encounters:  11/27/22 170 lb (77.1 kg)  10/07/22 170 lb 6.7 oz (77.3 kg)  09/13/22 179 lb (81.2 kg)     Ht Readings from Last 3 Encounters:  11/27/22 5\' 10"  (1.778 m)  10/07/22 5\' 10"  (1.778 m)  09/13/22 5' 10.5" (1.791 m)      General:The patient is awake, alert and  well groomed. Head:  Normocephalic, atraumatic.  Neck is supple. Mallampati 2 neck circumference:16.25,  Nasal airflow patent.   No Retrognathia - Full dentures.  Cardiovascular:  Regular rate and rhythm, without murmurs or carotid bruit, and without distended neck veins. Respiratory: Lungs are clear to auscultation. Skin:  Without evidence of edema, or rash Trunk: BMI is 25 again .  The patient's posture  is still erect. He is cooperative, pleasant,  fatigued. .    Neurologic exam : The patient is awake and alert, oriented to place and time.   Memory subjective described as intact.  Attention span & concentration ability appears normal.  His wife also does not feel that he has memory loss. Speech is fluent, with dysphonia. Mood and affect are appropriate, concerned. Reports some frustration.   Cranial nerves: Preserved sense of taste but loss of smell.  Pupils are equally reactive to light. Right eye status post cataract surgery.  Extraocular movements in vertical and horizontal planes intact and without nystagmus.  Visual fields by finger perimetry are intact. He blinks frequently  Hearing to finger rub intact. Facial sensation intact to fine touch.  Facial motor strength is symmetric and tongue and uvula move midline.  Quivering of the lips, no titubation. Shoulder shrug was symmetrical.  Motor exam:  [name] presents with cog-wheeling rigidity over the right biceps only, slight increase in tone at the right wrist.  There is a right hand resting tremor and action tremor noted.there is mild resting tremor in right hand , too.   None of these findings in the left hand.He noticed a change in his handwriting and that he has to concentrate on bringing food to his mouth.  Sensory:  Fine touch, pinprick and vibration were felt normal. Coordination: Rapid alternating movements in the fingers/hands was not slowed.   Finger-to-nose maneuver with right hand tremor, left dysmetria.   He feels clumsy- with  handwriting changes.  Gait and station: Patient walks without assistive device ., he has trouble to rise,  yet he could rise with his arms crossed in front of his chest- and he walks slightly stooped, small steps and fragmented turns.  Arm swing is reduced  He is stooped, and takes small steps- - he tries to look at his feet while walking. He turned with 5 steps. Stance is stable and normal.   Deep tendon reflexes: in the upper and lower extremities are symmetric and intact. Babinski deferred.     ASSESSMENT AND PLAN 77 y.o. year old male  here with:  Parkinson disease/ MCI/ Rheumatoid arthritis : Awake, alert, oriented x 3, does have resting tremor of the mouth and hands bilaterally, no strength or sensory deficit noted, speech is clear and goal oriented  he was in the hospital for Pneumonia recently. Reviewed ED notes, labs, EKG and Imaging.   I reviewed Ct chest MRI brain-  not so likely to represent NPH given the deep atrophy in the temporal lobe.    1) [name] has tolerated Namenda much better than Aricept which had given him GI symptoms.  His MoCA is remarkably stable actually the points he scored have increased over 2 years.  2) he has parkinsonism with gait disorder resting tremor slight situation and also vocal cord dysphonia.  He does not have trouble with swallowing and his gait is slowly getting slower but he is actively participating in physical therapy and with a personal trainer works on his parkinsonian physical symptoms.  This is more important than higher doses of medicine could ever provide.  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 He has developed some dysphonia and dysphagia. He has been given instructions by ST and By Dr Tonia Brooms.      I plan to follow up either personally or through our NP within 6 months. MOCA, Gait and labs  I would like to thank Merri Brunette, MD  and Brad Icard, DO or allowing me to meet with and to take care of this pleasant  patient.  After spending a total time of  31  minutes face to face and additional time for physical and neurologic examination, review of laboratory studies,  personal review of imaging studies, reports and results of other testing and review of referral information / records as far as provided in visit,   Electronically signed by: Melvyn Novas, MD 11/27/2022 9:44 AM  [facility] Board certified by The [employer] of Sleep Medicine and Diplomate of the [employer] of [employer]. Board certified In Neurology through the ABPN, Fellow of the [employer] of Neu[employer].

## 2022-11-27 NOTE — Patient Instructions (Signed)
77 y.o. year old male  here with:   Parkinson disease/ MCI/ Rheumatoid arthritis : Awake, alert, oriented x 3, does have resting tremor of the mouth and hands bilaterally, no strength or sensory deficit noted, speech is clear and goal oriented  he was in the hospital for Pneumonia recently. Reviewed ED notes, labs, EKG and Imaging.   I reviewed Ct chest MRI brain-  not so likely to represent NPH given the deep atrophy in the temporal lobe.    1) Tyler Hayes has tolerated Namenda much better than Aricept which had given him GI symptoms.  His MoCA is remarkably stable actually the points he scored have increased over 2 years.   2) he has parkinsonism with gait disorder resting tremor slight situation and also vocal cord dysphonia.  He does not have trouble with swallowing and his gait is slowly getting slower but he is actively participating in physical therapy and with a personal trainer works on his parkinsonian physical symptoms.  This is more important than higher doses of medicine could ever provide.   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 He has developed some dysphonia and dysphagia. He has been given instructions by ST and By Dr Tonia Brooms.

## 2022-11-28 NOTE — Progress Notes (Signed)
CT chest imaging reviewed.  Patient has an appointment with SG, NP coming up.  The nodules that we have been observing have improved in size compatible with scarring or infectious inflammatory process.  Thanks,  BLI  Josephine Igo, DO [facility] 11/28/2022 11:30 AM

## 2022-12-05 DIAGNOSIS — I10 Essential (primary) hypertension: Secondary | ICD-10-CM | POA: Diagnosis not present

## 2022-12-05 DIAGNOSIS — E118 Type 2 diabetes mellitus with unspecified complications: Secondary | ICD-10-CM | POA: Diagnosis not present

## 2022-12-09 DIAGNOSIS — M8580 Other specified disorders of bone density and structure, unspecified site: Secondary | ICD-10-CM | POA: Diagnosis not present

## 2022-12-09 DIAGNOSIS — M0609 Rheumatoid arthritis without rheumatoid factor, multiple sites: Secondary | ICD-10-CM | POA: Diagnosis not present

## 2022-12-09 DIAGNOSIS — I428 Other cardiomyopathies: Secondary | ICD-10-CM | POA: Diagnosis not present

## 2022-12-09 DIAGNOSIS — Z79899 Other long term (current) drug therapy: Secondary | ICD-10-CM | POA: Diagnosis not present

## 2022-12-09 DIAGNOSIS — M2550 Pain in unspecified joint: Secondary | ICD-10-CM | POA: Diagnosis not present

## 2022-12-09 DIAGNOSIS — M25569 Pain in unspecified knee: Secondary | ICD-10-CM | POA: Diagnosis not present

## 2022-12-09 DIAGNOSIS — R768 Other specified abnormal immunological findings in serum: Secondary | ICD-10-CM | POA: Diagnosis not present

## 2022-12-09 DIAGNOSIS — M1990 Unspecified osteoarthritis, unspecified site: Secondary | ICD-10-CM | POA: Diagnosis not present

## 2022-12-13 ENCOUNTER — Encounter: Payer: Self-pay | Admitting: Acute Care

## 2022-12-13 ENCOUNTER — Ambulatory Visit: Payer: Medicare Other | Admitting: Acute Care

## 2022-12-13 VITALS — BP 118/76 | HR 76 | Temp 97.3°F | Ht 70.5 in | Wt 174.6 lb

## 2022-12-13 DIAGNOSIS — G20A2 Parkinson's disease without dyskinesia, with fluctuations: Secondary | ICD-10-CM

## 2022-12-13 DIAGNOSIS — R918 Other nonspecific abnormal finding of lung field: Secondary | ICD-10-CM

## 2022-12-13 DIAGNOSIS — Z9289 Personal history of other medical treatment: Secondary | ICD-10-CM

## 2022-12-13 DIAGNOSIS — Z87891 Personal history of nicotine dependence: Secondary | ICD-10-CM

## 2022-12-13 DIAGNOSIS — Z8701 Personal history of pneumonia (recurrent): Secondary | ICD-10-CM

## 2022-12-13 NOTE — Progress Notes (Addendum)
History of Present Illness Tyler Hayes is a 77 y.o. male Referred in April 2023 for abnormal CT chest, groundglass lung nodule by Merri Brunette, MD   Synopsis This is a 77 year old gentleman, past medical history of Parkinson's disease, rheumatoid arthritis on leflunomide, history of prostate cancer, and hypertension. Recent pneumonia. Patient is followed  for an abnormal CT scan of the chest 08/2021. His CT scan revealed a 1.8 groundglass subsolid nodular area. He also has other smaller right lower lobe lung nodules. He does admit to having occasional choking and or strangulation events while eating. He has a little bit of a mouth tremor and hand tremor. He was a former smoker quit in 1992 unsure of exactly how long he smoked but was for several years.   Pt. Was admitted for pneumonia 09/2022. Chest x-ray with right lower lobe infiltrate. Head CT negative. Cultures were drawn,and  negative. RSV, flu and COVID-negative.  He was started on Rocephin and azithromycin . He completed treatment , and was discharged . He has been doing well since.  CT Chest is due 11/2022 to re-evaluate ground glass lung nodules.    12/13/2022 Pt. Presents for follow up after CT Chest. He states he has been doing well. He was admitted to the hospital with pneumonia 09/2022. He was treated with Rocephin and azithromycin , and he has improved.He feels he has returned to his baseline in regard to his pulmonary function.  We have reviewed the follow up  CT Chest. The nodules that we have been observing have improved in size compatible with scarring or infectious inflammatory process. He has no active signs or symptoms of infection.No fever, discolored secretions , of fatigue. He said he know when he is getting sick as his Parkinson's tremors get worse. We have had a long conversation , and we discussed making sure that as soon as he feels he may be coming down with something, shortness of breath etc, to call to be seen so  we can be proactive and perhaps prevent decline requiring hospitalization. He is very in tune to his signs and symptoms, and said he will seek care as soon as he notices changes. Both he and his wife are in agreement with a 3 month follow up to ensure the tree in bud opacities are continuing to resolve. I will consider flutter valve at follow up if these areas have progressed, and he develops symptoms..  Test Results: 11/20/2022 CT Chest  Interval decrease in size of the previously demonstrated ground-glass opacity in the right middle lobe, now with a linear configuration, compatible with a small area of scarring. 2. Interval development of multiple ill-defined areas of ground-glass opacity in the right lower lobe, some with an areas of tree in bud densities that were previously present. These findings are most consistent with an infectious or inflammatory process. 3. Mild calcific coronary artery and aortic atherosclerosis.    Latest Ref Rng & Units 10/08/2022    7:41 AM 10/07/2022    6:18 AM 10/07/2022    1:20 AM  CBC  WBC 4.0 - 10.5 K/uL 5.4  6.9  7.4   Hemoglobin 13.0 - 17.0 g/dL 09.8  11.9  14.7   Hematocrit 39.0 - 52.0 % 36.5  34.0  35.2   Platelets 150 - 400 K/uL 205  161  175        Latest Ref Rng & Units 10/08/2022    7:41 AM 10/07/2022    6:18 AM 10/07/2022    1:20  AM  BMP  Glucose 70 - 99 mg/dL 93  85  90   BUN 8 - 23 mg/dL 9  11  13    Creatinine 0.61 - 1.24 mg/dL 8.41  3.24  4.01   Sodium 135 - 145 mmol/L 137  135  134   Potassium 3.5 - 5.1 mmol/L 3.9  3.2  3.9   Chloride 98 - 111 mmol/L 106  102  98   CO2 22 - 32 mmol/L 22  19  23    Calcium 8.9 - 10.3 mg/dL 8.9  8.6  8.9     BNP No results found for: "BNP"  ProBNP No results found for: "PROBNP"  PFT No results found for: "FEV1PRE", "FEV1POST", "FVCPRE", "FVCPOST", "TLC", "DLCOUNC", "PREFEV1FVCRT", "PSTFEV1FVCRT"  CT Chest Wo Contrast  Result Date: 11/26/2022 CLINICAL DATA:  Follow-up 1.5 cm patchy ground-glass  density in the right middle lobe. EXAM: CT CHEST WITHOUT CONTRAST TECHNIQUE: Multidetector CT imaging of the chest was performed following the standard protocol without IV contrast. RADIATION DOSE REDUCTION: This exam was performed according to the departmental dose-optimization program which includes automated exposure control, adjustment of the mA and/or kV according to patient size and/or use of iterative reconstruction technique. COMPARISON:  12/07/2021 and 08/03/2021. FINDINGS: Cardiovascular: Mild atheromatous calcifications, including the coronary arteries and aorta. Normal sized heart. No pericardial effusion. Mediastinum/Nodes: No enlarged mediastinal or axillary lymph nodes. Thyroid gland, trachea, and esophagus demonstrate no significant findings. Lungs/Pleura: The previously demonstrated ground-glass opacity in the right middle lobe is less dense and measures 1.1 x 1.0 cm on image number 67/6. This previously measured 1.5 x 1.3 cm in corresponding dimensions on 12/07/2021. In configuration in the today, this is linear sagittal plane, compatible with small linear area of scarring. There are interval multiple ill-defined areas of ground-glass opacity in the right lower lobe, some with an areas of tree in bud densities that were previously present. Clear left lung. No pleural fluid. Two small calcified granulomata in the right middle lobe. Upper Abdomen: Mild atheromatous arterial calcifications. Musculoskeletal: Mild thoracic spine degenerative changes. IMPRESSION: 1. Interval decrease in size of the previously demonstrated ground-glass opacity in the right middle lobe, now with a linear configuration, compatible with a small area of scarring. 2. Interval development of multiple ill-defined areas of ground-glass opacity in the right lower lobe, some with an areas of tree in bud densities that were previously present. These findings are most consistent with an infectious or inflammatory process. 3. Mild  calcific coronary artery and aortic atherosclerosis. Aortic Atherosclerosis (ICD10-I70.0). Electronically Signed   By: Beckie Salts M.D.   On: 11/26/2022 14:40     Past medical hx Past Medical History:  Diagnosis Date   Arthritis    back   Cancer (HCC)    prostate   Diabetes mellitus without complication (HCC)    on meds   Diverticulosis    Hypercholesteremia    Hypertension    Meatal stenosis    Osteoarthritis    Sinus drainage    Tinea pedis      Social History   Tobacco Use   Smoking status: Former    Current packs/day: 0.00    Average packs/day: 1.5 packs/day for 20.0 years (30.0 ttl pk-yrs)    Types: Cigarettes    Start date: 02/01/1971    Quit date: 02/01/1991    Years since quitting: 31.8   Smokeless tobacco: Never  Vaping Use   Vaping status: Never Used  Substance Use Topics   Alcohol use: Yes  Comment: occasionally   Drug use: No    Mr.Bentivegna reports that he quit smoking about 31 years ago. His smoking use included cigarettes. He started smoking about 51 years ago. He has a 30 pack-year smoking history. He has never used smokeless tobacco. He reports current alcohol use. He reports that he does not use drugs.  Tobacco Cessation: Former smoker smoked x 20 years, quit 1992 with a 30 pack year smoking history.   Past surgical hx, Family hx, Social hx all reviewed.  Current Outpatient Medications on File Prior to Visit  Medication Sig   Abatacept (ORENCIA IV) Inject into the vein. Gets every 4 weeks for awhile and then goes to monthly infusions   albuterol (VENTOLIN HFA) 108 (90 Base) MCG/ACT inhaler Inhale 1-2 puffs into the lungs every 6 (six) hours as needed for wheezing or shortness of breath.   amLODipine (NORVASC) 2.5 MG tablet Take 2.5 mg by mouth daily.   aspirin 81 MG chewable tablet Chew 81 mg by mouth daily.   atorvastatin (LIPITOR) 80 MG tablet Take 1 tablet (80 mg total) by mouth daily.   carbidopa-levodopa (SINEMET IR) 25-100 MG tablet Take 1  tablet by mouth 3 (three) times daily. TAKE 1 TABLET 3 TIMES A DAYWITH WATER 30 MINUTES      BEFORE MEALS   carvedilol (COREG) 6.25 MG tablet Take 1 tablet (6.25 mg total) by mouth 2 (two) times daily.   Cholecalciferol 50 MCG (2000 UT) TABS Take 2,000 Units by mouth daily.   clonazePAM (KLONOPIN) 0.5 MG tablet At bedtime 1 tab and if needed add a half tab later in the night. REM BD   fluticasone (FLONASE) 50 MCG/ACT nasal spray Place 1 spray into both nostrils daily as needed for rhinitis (drainage issues.).    KLOR-CON M10 10 MEQ tablet Take 10 mEq by mouth daily.   leflunomide (ARAVA) 20 MG tablet Take 20 mg by mouth daily.   losartan (COZAAR) 100 MG tablet Take 100 mg by mouth at bedtime.   memantine (NAMENDA) 10 MG tablet Take 1 tablet (10 mg total) by mouth 2 (two) times daily.   montelukast (SINGULAIR) 10 MG tablet Take 10 mg by mouth every evening.   SYNJARDY XR 09-998 MG TB24 Take 2 tablets by mouth daily.    No current facility-administered medications on file prior to visit.     Allergies  Allergen Reactions   Other Anaphylaxis   Ace Inhibitors Cough    LISINOPRIL   Antihistamines, Diphenhydramine-Type Other (See Comments)    Other reaction(s): Unknown    Review Of Systems:  Constitutional:   No  weight loss, night sweats,  Fevers, chills, fatigue, or  lassitude.  HEENT:   No headaches,  Difficulty swallowing,  Tooth/dental problems, or  Sore throat,                No sneezing, itching, ear ache, nasal congestion, post nasal drip,   CV:  No chest pain,  Orthopnea, PND, swelling in lower extremities, anasarca, dizziness, palpitations, syncope.   GI  No heartburn, indigestion, abdominal pain, nausea, vomiting, diarrhea, change in bowel habits, loss of appetite, bloody stools.   Resp: No shortness of breath with exertion or at rest.  No excess mucus, no productive cough,  No non-productive cough,  No coughing up of blood.  No change in color of mucus.  No wheezing.  No  chest wall deformity  Skin: no rash or lesions.  GU: no dysuria, change in color of urine, no  urgency or frequency.  No flank pain, no hematuria   MS:  No joint pain or swelling.  No decreased range of motion.  No back pain.  Psych:  No change in mood or affect. No depression or anxiety.  No memory loss.   Vital Signs BP 118/76 (BP Location: Left Arm, Patient Position: Sitting, Cuff Size: Normal)   Pulse 76   Temp (!) 97.3 F (36.3 C) (Temporal)   Ht 5' 10.5" (1.791 m)   Wt 174 lb 9.6 oz (79.2 kg)   SpO2 95%   BMI 24.70 kg/m    Physical Exam:  General- No distress,  A&Ox3, pleasant, very on top of his health and management  ENT: No sinus tenderness, TM clear, pale nasal mucosa, no oral exudate,no post nasal drip, no LAN Cardiac: S1, S2, regular rate and rhythm, no murmur Chest: No wheeze/ rales/ dullness; no accessory muscle use, no nasal flaring, no sternal retractions Abd.: Soft Non-tender, ND, BS +, Body mass index is 24.7 kg/m.  Ext: No clubbing cyanosis, edema Neuro:  normal strength, slight  parkinsonian tremor , A&O x 3, MAE x 4 Skin: No rashes, warm and dry, no lesions  Psych: normal mood and behavior, very pleasant    Assessment/Plan Stable Lung Nodules per recent CT Imaging Recent hospitalization for  Tree In Bud densities previously noted  most consistent with an infectious or inflammatory process.>> Pt is asymptomatic Parkinson's Disease  Former smoker pneumonia 09/2022>> Treated with Rocephin and Azithromycin for return to baseline Plan Your Ct Chest shows continued improvement of the lung nodules we have been following This is great news.  Follow up in 3 months to ensure you are continuing to do well with Maralyn Sago NP  Call if you notice any shortness of breath so we can get you in to be seen We want to be proactive and catch anything that may be brewing early.  Call if you need [place] sooner. At follow up if any  + symptoms will consider Flutter Valve Please  contact office for sooner follow up if symptoms do not improve or worsen or seek emergency care    I spent 25 minutes dedicated to the care of this patient on the date of this encounter to include pre-visit review of records, face-to-face time with the patient discussing conditions above, post visit ordering of testing, clinical documentation with the electronic health record, making appropriate referrals as documented, and communicating necessary information to the patient's healthcare team.   Bevelyn Ngo, NP 12/13/2022  10:12 AM

## 2022-12-13 NOTE — Patient Instructions (Signed)
It is good to see you today. Your Ct Chest shows continued improvement of the lung nodules we have been following This is great news.  Follow up in 3 months to ensure you are continuing to do well with Tyler Sago NP  Call if you notice any shortness of breath so we can get you in to be seen We want to be proactive and catch anything that may be brewing early.  Call if you need [place] sooner. Please contact office for sooner follow up if symptoms do not improve or worsen or seek emergency care

## 2022-12-18 ENCOUNTER — Ambulatory Visit: Payer: Medicare Other | Admitting: Acute Care

## 2022-12-24 DIAGNOSIS — M0609 Rheumatoid arthritis without rheumatoid factor, multiple sites: Secondary | ICD-10-CM | POA: Diagnosis not present

## 2023-01-08 ENCOUNTER — Other Ambulatory Visit (HOSPITAL_COMMUNITY): Payer: Self-pay | Admitting: Cardiology

## 2023-01-08 ENCOUNTER — Encounter: Payer: Self-pay | Admitting: Podiatry

## 2023-01-08 ENCOUNTER — Ambulatory Visit (INDEPENDENT_AMBULATORY_CARE_PROVIDER_SITE_OTHER): Payer: Medicare Other | Admitting: Podiatry

## 2023-01-08 DIAGNOSIS — N1831 Chronic kidney disease, stage 3a: Secondary | ICD-10-CM | POA: Diagnosis not present

## 2023-01-08 DIAGNOSIS — M79675 Pain in left toe(s): Secondary | ICD-10-CM

## 2023-01-08 DIAGNOSIS — M79674 Pain in right toe(s): Secondary | ICD-10-CM | POA: Diagnosis not present

## 2023-01-08 DIAGNOSIS — B351 Tinea unguium: Secondary | ICD-10-CM

## 2023-01-08 DIAGNOSIS — E782 Mixed hyperlipidemia: Secondary | ICD-10-CM | POA: Diagnosis not present

## 2023-01-08 NOTE — Progress Notes (Signed)
This patient presents to the office with chief complaint of long thick painful nails.  Patient says the nails are painful walking and wearing shoes.  This patient is unable to self treat.  This patient is unable to trim his nails since he is unable to reach his nails.  She presents to the office for preventative foot care services.  General Appearance  Alert, conversant and in no acute stress.  Vascular  Dorsalis pedis and posterior tibial  pulses are palpable  bilaterally.  Capillary return is within normal limits  bilaterally. Temperature is within normal limits  bilaterally.  Neurologic  Senn-Weinstein monofilament wire test within normal limits  bilaterally. Muscle power within normal limits bilaterally.  Nails Thick disfigured discolored nails with subungual debris  from hallux to fifth toes bilaterally. No evidence of bacterial infection or drainage bilaterally.  Orthopedic  No limitations of motion  feet .  No crepitus or effusions noted.  No bony pathology or digital deformities noted.  Skin  normotropic skin with no porokeratosis noted bilaterally.  No signs of infections or ulcers noted.     Onychomycosis  Nails  B/L.  Pain in right toes  Pain in left toes  Debridement of nails both feet followed trimming the nails with dremel tool.    RTC 3 1/2  months.   Helane Gunther DPM

## 2023-01-09 LAB — LIPID PANEL
Chol/HDL Ratio: 2.6 ratio (ref 0.0–5.0)
Cholesterol, Total: 129 mg/dL (ref 100–199)
HDL: 50 mg/dL (ref >39)
LDL Chol Calc (NIH): 68 mg/dL (ref 0–99)
Triglycerides: 46 mg/dL (ref 0–149)
VLDL Cholesterol Cal: 11 mg/dL (ref 5–40)

## 2023-01-21 DIAGNOSIS — M0609 Rheumatoid arthritis without rheumatoid factor, multiple sites: Secondary | ICD-10-CM | POA: Diagnosis not present

## 2023-01-26 ENCOUNTER — Other Ambulatory Visit: Payer: Self-pay | Admitting: Cardiology

## 2023-01-26 ENCOUNTER — Other Ambulatory Visit: Payer: Self-pay | Admitting: Diagnostic Neuroimaging

## 2023-02-05 ENCOUNTER — Ambulatory Visit: Payer: Medicare Other | Admitting: Family Medicine

## 2023-02-06 DIAGNOSIS — I428 Other cardiomyopathies: Secondary | ICD-10-CM | POA: Diagnosis not present

## 2023-02-06 DIAGNOSIS — R42 Dizziness and giddiness: Secondary | ICD-10-CM | POA: Diagnosis not present

## 2023-02-06 DIAGNOSIS — E7800 Pure hypercholesterolemia, unspecified: Secondary | ICD-10-CM | POA: Diagnosis not present

## 2023-02-06 DIAGNOSIS — E1169 Type 2 diabetes mellitus with other specified complication: Secondary | ICD-10-CM | POA: Diagnosis not present

## 2023-02-06 DIAGNOSIS — I10 Essential (primary) hypertension: Secondary | ICD-10-CM | POA: Diagnosis not present

## 2023-02-07 DIAGNOSIS — Z23 Encounter for immunization: Secondary | ICD-10-CM | POA: Diagnosis not present

## 2023-02-19 DIAGNOSIS — M0609 Rheumatoid arthritis without rheumatoid factor, multiple sites: Secondary | ICD-10-CM | POA: Diagnosis not present

## 2023-03-05 ENCOUNTER — Emergency Department (HOSPITAL_BASED_OUTPATIENT_CLINIC_OR_DEPARTMENT_OTHER): Payer: Medicare Other

## 2023-03-05 ENCOUNTER — Encounter (HOSPITAL_BASED_OUTPATIENT_CLINIC_OR_DEPARTMENT_OTHER): Payer: Self-pay | Admitting: Emergency Medicine

## 2023-03-05 ENCOUNTER — Other Ambulatory Visit: Payer: Self-pay

## 2023-03-05 ENCOUNTER — Emergency Department (HOSPITAL_BASED_OUTPATIENT_CLINIC_OR_DEPARTMENT_OTHER)
Admission: EM | Admit: 2023-03-05 | Discharge: 2023-03-05 | Disposition: A | Discharge status: Home / Self Care | Payer: Medicare Other

## 2023-03-05 DIAGNOSIS — S01111A Laceration without foreign body of right eyelid and periocular area, initial encounter: Secondary | ICD-10-CM | POA: Insufficient documentation

## 2023-03-05 DIAGNOSIS — I6782 Cerebral ischemia: Secondary | ICD-10-CM | POA: Diagnosis not present

## 2023-03-05 DIAGNOSIS — G9389 Other specified disorders of brain: Secondary | ICD-10-CM | POA: Diagnosis not present

## 2023-03-05 DIAGNOSIS — Z7982 Long term (current) use of aspirin: Secondary | ICD-10-CM | POA: Diagnosis not present

## 2023-03-05 DIAGNOSIS — S0990XA Unspecified injury of head, initial encounter: Secondary | ICD-10-CM | POA: Insufficient documentation

## 2023-03-05 DIAGNOSIS — G20A1 Parkinson's disease without dyskinesia, without mention of fluctuations: Secondary | ICD-10-CM | POA: Diagnosis not present

## 2023-03-05 DIAGNOSIS — W06XXXA Fall from bed, initial encounter: Secondary | ICD-10-CM | POA: Insufficient documentation

## 2023-03-05 DIAGNOSIS — S0993XA Unspecified injury of face, initial encounter: Secondary | ICD-10-CM | POA: Diagnosis present

## 2023-03-05 DIAGNOSIS — W19XXXA Unspecified fall, initial encounter: Secondary | ICD-10-CM

## 2023-03-05 HISTORY — DX: Parkinson's disease without dyskinesia, without mention of fluctuations: G20.A1

## 2023-03-05 NOTE — ED Notes (Signed)
Patient transported to CT 

## 2023-03-05 NOTE — ED Notes (Signed)
Dermabond at bedside for EDP

## 2023-03-05 NOTE — Discharge Instructions (Signed)
Please follow-up with your primary doctor.  Return to the emergency department immediately develop fevers, chills, sudden onset headache, vision changes, pain with moving eyes, redness or drainage to the laceration or, please return if develop any new or worsening symptoms that are concerning to you.

## 2023-03-05 NOTE — ED Triage Notes (Signed)
Larey Seat out of bed while turning over this am ,hit the right side of face on nightstand. Presents with lac to right eyelid. No LOC. Fall was witnessed. No neuro changes.

## 2023-03-05 NOTE — ED Notes (Signed)
Patient returned from CT

## 2023-03-05 NOTE — ED Provider Notes (Signed)
[place] EMERGENCY DEPARTMENT AT [facility] Provider Note   CSN: [id number] Arrival date & time: 03/05/23  0710     History  No chief complaint on file.   Birdie Beveridge is a 77 y.o. male.  77 year old male present emergency department for fall with laceration to his right eyelid.  Last tetanus shot was several years ago, but within the last 10 years per patient and wife report.  He notes that he has Parkinson's and has night behaviors.  He is unsure what happened, but seemingly rolled over out of the bed and hit head on the nightstand.  Has been pain around the area and slight headache, but no vision changes, neck pain, chest pain, no abdominal pain.  Has no other injuries or complaints other than the laceration to his head.        Home Medications Prior to Admission medications   Medication Sig Start Date End Date Taking? Authorizing Provider  Abatacept (ORENCIA IV) Inject into the vein. Gets every 4 weeks for awhile and then goes to monthly infusions    [provider]  albuterol (VENTOLIN HFA) 108 (90 Base) MCG/ACT inhaler Inhale 1-2 puffs into the lungs every 6 (six) hours as needed for wheezing or shortness of breath. 08/17/20   [provider]  amLODipine (NORVASC) 2.5 MG tablet Take 2.5 mg by mouth daily. 05/09/22   [provider]  aspirin 81 MG chewable tablet Chew 81 mg by mouth daily. 08/16/20   [provider]  atorvastatin (LIPITOR) 80 MG tablet Take 1 tablet (80 mg total) by mouth daily. 09/13/22   Patwardhan, Anabel Bene, MD  carbidopa-levodopa (SINEMET IR) 25-100 MG tablet Take 1 tablet by mouth 3 (three) times daily. TAKE 1 TABLET 3 TIMES A DAYWITH WATER 30 MINUTES      BEFORE MEALS 11/27/22   Dohmeier, Porfirio Mylar, MD  carvedilol (COREG) 6.25 MG tablet TAKE 1 TABLET BY MOUTH TWICE A DAY 01/27/23   Patwardhan, Manish J, MD  Cholecalciferol 50 MCG (2000 UT) TABS Take 2,000 Units by mouth daily.    [provider]   clonazePAM (KLONOPIN) 0.5 MG tablet At bedtime 1 tab and if needed add a half tab later in the night. REM BD 11/27/22   Dohmeier, Porfirio Mylar, MD  fluticasone [facility]) 50 MCG/ACT nasal spray Place 1 spray into both nostrils daily as needed for rhinitis (drainage issues.).     [provider]  KLOR-CON M10 10 MEQ tablet Take 10 mEq by mouth daily. 08/09/21   [provider]  leflunomide (ARAVA) 20 MG tablet Take 20 mg by mouth daily.    [provider]  losartan (COZAAR) 100 MG tablet Take 100 mg by mouth at bedtime. 09/14/20   [provider]  memantine (NAMENDA) 10 MG tablet Take 1 tablet (10 mg total) by mouth 2 (two) times daily. 11/27/22   Dohmeier, Porfirio Mylar, MD  montelukast (SINGULAIR) 10 MG tablet Take 10 mg by mouth every evening.    [provider]  SYNJARDY XR 09-998 MG TB24 Take 2 tablets by mouth daily.  11/12/17   [provider]      Allergies    Other; Ace inhibitors; and Antihistamines, diphenhydramine-type    Review of Systems   Review of Systems  Physical Exam Updated Vital Signs BP (!) 150/78   Pulse (!) 57   Temp 97.6 F (36.4 C) (Oral)   Resp 20   Wt 78.9 kg   SpO2 97%   BMI 24.61 kg/m  Physical Exam Vitals and nursing note reviewed.  HENT:     Head: Normocephalic.     Comments: Small superficial 1-1/2 cm laceration just below the right lateral eyebrow.  No obvious foreign body.  No active bleeding.    Nose: Nose normal.  Eyes:     Extraocular Movements: Extraocular movements intact.     Pupils: Pupils are equal, round, and reactive to light.     Comments: Pain-free EOM.  Cardiovascular:     Rate and Rhythm: Normal rate and regular rhythm.  Pulmonary:     Effort: Pulmonary effort is normal.     Breath sounds: Normal breath sounds.  Abdominal:     General: Abdomen is flat. There is no distension.     Palpations: Abdomen is soft.     Tenderness: There is no abdominal tenderness. There is no guarding or  rebound.  Musculoskeletal:        General: Normal range of motion.     Cervical back: Normal range of motion.  Skin:    General: Skin is warm and dry.     Capillary Refill: Capillary refill takes less than 2 seconds.  Neurological:     General: No focal deficit present.     Mental Status: He is alert and oriented to person, place, and time.  Psychiatric:        Mood and Affect: Mood normal.        Behavior: Behavior normal.     ED Results / Procedures / Treatments   Labs (all labs ordered are listed, but only abnormal results are displayed) Labs Reviewed - No data to display  EKG None  Radiology CT Head Wo Contrast  Result Date: 03/05/2023 CLINICAL DATA:  Minor head trauma, fall from bed with laceration to right eyelid. EXAM: CT HEAD WITHOUT CONTRAST TECHNIQUE: Contiguous axial images were obtained from the base of the skull through the vertex without intravenous contrast. RADIATION DOSE REDUCTION: This exam was performed according to the departmental dose-optimization program which includes automated exposure control, adjustment of the mA and/or kV according to patient size and/or use of iterative reconstruction technique. COMPARISON:  Brain MRI 10/07/2022 FINDINGS: Brain: No evidence of acute infarction, hemorrhage, obstructive hydrocephalus, extra-axial collection or mass lesion/mass effect. Ventriculomegaly with disproportionate subarachnoid spaces and callosum angle narrowing, also described on prior brain MRI. Cerebral volume loss and chronic small vessel ischemia in the hemispheric white matter. Vascular: No hyperdense vessel or unexpected calcification. Skull: Normal. Negative for fracture or focal lesion. Sinuses/Orbits: No acute finding. IMPRESSION: 1. No evidence of intracranial injury or fracture. 2. 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 Electronically Signed   By: Tiburcio Pea M.D.   On: 03/05/2023 08:05     Procedures Procedures    Medications Ordered in ED Medications - No data to display  ED Course/ Medical Decision Making/ A&P Clinical Course as of 03/05/23 1016  Wed Mar 05, 2023  0827 CT Head Wo Contrast IMPRESSION: 1. No evidence of intracranial injury or fracture. 2. 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   [TY]    Clinical Course User Index [TY] Coral Spikes, DO                                 Medical Decision Making 77 year old male present emergency department for evaluation after a fall with laceration to his right eyebrow.  Vital signs  reassuring.  No localizing deficits on exam.  He and his wife report he had his last tetanus shot several years ago and do not think he is due for another one.  Wife also notes that he is not on any blood thinner.  Considered labs, however patient with stable vitals, alert and oriented x 3 with no complaints.  Labs unlikely to change management or disposition as it sounds like patient frequently has behaviors while he sleeps.  Low suspicion for acute intrathoracic or intra-abdominal traumatic injury.  However, given patient's advanced age will get CT head to evaluate for acute intracranial injury.  Laceration is quite superficial and discussed sutures versus Dermabond with patient and spouse.  They would like to go for Dermabond.  If CT head is negative will discharge in stable condition.  Amount and/or Complexity of Data Reviewed Radiology: ordered.          Final Clinical Impression(s) / ED Diagnoses Final diagnoses:  Fall, initial encounter  Laceration of eye region, right, initial encounter    Rx / DC Orders ED Discharge Orders     None         Coral Spikes, DO 03/05/23 1016

## 2023-03-19 DIAGNOSIS — M0609 Rheumatoid arthritis without rheumatoid factor, multiple sites: Secondary | ICD-10-CM | POA: Diagnosis not present

## 2023-04-01 DIAGNOSIS — E119 Type 2 diabetes mellitus without complications: Secondary | ICD-10-CM | POA: Diagnosis not present

## 2023-04-01 DIAGNOSIS — Z961 Presence of intraocular lens: Secondary | ICD-10-CM | POA: Diagnosis not present

## 2023-04-02 ENCOUNTER — Ambulatory Visit (INDEPENDENT_AMBULATORY_CARE_PROVIDER_SITE_OTHER): Payer: Medicare Other | Admitting: Podiatry

## 2023-04-02 ENCOUNTER — Encounter: Payer: Self-pay | Admitting: Podiatry

## 2023-04-02 DIAGNOSIS — B351 Tinea unguium: Secondary | ICD-10-CM | POA: Diagnosis not present

## 2023-04-02 DIAGNOSIS — M79675 Pain in left toe(s): Secondary | ICD-10-CM | POA: Diagnosis not present

## 2023-04-02 DIAGNOSIS — M79674 Pain in right toe(s): Secondary | ICD-10-CM

## 2023-04-02 DIAGNOSIS — G20C Parkinsonism, unspecified: Secondary | ICD-10-CM

## 2023-04-02 NOTE — Progress Notes (Signed)
This patient presents to the office with chief complaint of long thick painful nails.  Patient says the nails are painful walking and wearing shoes.  This patient is unable to self treat.  This patient is unable to trim his nails since he is unable to reach his nails.  He has been diagnosed with Parkinsons. and type 2 diabetes.  he presents to the office for preventative foot care services.  General Appearance  Alert, conversant and in no acute stress.  Vascular  Dorsalis pedis and posterior tibial  pulses are palpable  bilaterally.  Capillary return is within normal limits  bilaterally. Temperature is within normal limits  bilaterally.  Neurologic  Senn-Weinstein monofilament wire test within normal limits  bilaterally. Muscle power within normal limits bilaterally.  Nails Thick disfigured discolored nails with subungual debris  from hallux to fifth toes bilaterally. No evidence of bacterial infection or drainage bilaterally.  Orthopedic  No limitations of motion  feet .  No crepitus or effusions noted.  No bony pathology or digital deformities noted.  Skin  normotropic skin with no porokeratosis noted bilaterally.  No signs of infections or ulcers noted.     Onychomycosis  Nails  B/L.  Pain in right toes  Pain in left toes  Debridement of nails both feet followed trimming the nails with dremel tool.    RTC  3  months.   Helane Gunther DPM

## 2023-04-16 ENCOUNTER — Ambulatory Visit: Payer: Medicare Other | Admitting: Podiatry

## 2023-04-23 ENCOUNTER — Ambulatory Visit: Payer: Medicare Other | Admitting: Podiatry

## 2023-04-23 DIAGNOSIS — M8580 Other specified disorders of bone density and structure, unspecified site: Secondary | ICD-10-CM | POA: Diagnosis not present

## 2023-04-23 DIAGNOSIS — I428 Other cardiomyopathies: Secondary | ICD-10-CM | POA: Diagnosis not present

## 2023-04-23 DIAGNOSIS — M0609 Rheumatoid arthritis without rheumatoid factor, multiple sites: Secondary | ICD-10-CM | POA: Diagnosis not present

## 2023-04-23 DIAGNOSIS — Z79899 Other long term (current) drug therapy: Secondary | ICD-10-CM | POA: Diagnosis not present

## 2023-04-23 DIAGNOSIS — M25569 Pain in unspecified knee: Secondary | ICD-10-CM | POA: Diagnosis not present

## 2023-04-23 DIAGNOSIS — M79645 Pain in left finger(s): Secondary | ICD-10-CM | POA: Diagnosis not present

## 2023-04-23 DIAGNOSIS — R768 Other specified abnormal immunological findings in serum: Secondary | ICD-10-CM | POA: Diagnosis not present

## 2023-04-23 DIAGNOSIS — M7989 Other specified soft tissue disorders: Secondary | ICD-10-CM | POA: Diagnosis not present

## 2023-04-23 DIAGNOSIS — M2550 Pain in unspecified joint: Secondary | ICD-10-CM | POA: Diagnosis not present

## 2023-04-23 DIAGNOSIS — M1990 Unspecified osteoarthritis, unspecified site: Secondary | ICD-10-CM | POA: Diagnosis not present

## 2023-04-28 ENCOUNTER — Other Ambulatory Visit: Payer: Self-pay | Admitting: Diagnostic Neuroimaging

## 2023-05-01 MED ORDER — MEMANTINE HCL 10 MG PO TABS: 10.0000 mg | ORAL_TABLET | Freq: Two times a day (BID) | ORAL | 1 refills | Status: AC

## 2023-05-08 DIAGNOSIS — I2510 Atherosclerotic heart disease of native coronary artery without angina pectoris: Secondary | ICD-10-CM | POA: Diagnosis not present

## 2023-05-08 DIAGNOSIS — R42 Dizziness and giddiness: Secondary | ICD-10-CM | POA: Diagnosis not present

## 2023-05-08 DIAGNOSIS — E7800 Pure hypercholesterolemia, unspecified: Secondary | ICD-10-CM | POA: Diagnosis not present

## 2023-05-08 DIAGNOSIS — I10 Essential (primary) hypertension: Secondary | ICD-10-CM | POA: Diagnosis not present

## 2023-05-08 DIAGNOSIS — E1169 Type 2 diabetes mellitus with other specified complication: Secondary | ICD-10-CM | POA: Diagnosis not present

## 2023-05-08 DIAGNOSIS — I428 Other cardiomyopathies: Secondary | ICD-10-CM | POA: Diagnosis not present

## 2023-05-22 DIAGNOSIS — M0609 Rheumatoid arthritis without rheumatoid factor, multiple sites: Secondary | ICD-10-CM | POA: Diagnosis not present

## 2023-06-11 ENCOUNTER — Ambulatory Visit: Payer: Medicare Other | Admitting: Neurology

## 2023-06-11 ENCOUNTER — Ambulatory Visit: Payer: Medicare Other | Admitting: Family Medicine

## 2023-06-11 ENCOUNTER — Encounter: Payer: Self-pay | Admitting: Neurology

## 2023-06-11 VITALS — BP 126/76 | HR 75 | Ht 70.0 in | Wt 183.0 lb

## 2023-06-11 DIAGNOSIS — G20C Parkinsonism, unspecified: Secondary | ICD-10-CM | POA: Diagnosis not present

## 2023-06-11 DIAGNOSIS — G4752 REM sleep behavior disorder: Secondary | ICD-10-CM

## 2023-06-11 DIAGNOSIS — G3184 Mild cognitive impairment, so stated: Secondary | ICD-10-CM | POA: Diagnosis not present

## 2023-06-11 NOTE — Progress Notes (Signed)
Provider:  Melvyn Novas, MD  Primary Care Physician:  Merri Brunette, MD [address] [place] [address] [place] [address]     Referring Provider: Merri Brunette, Md [address] [place],  [place] [address]          Chief Complaint according to patient   Patient presents with:     New referral for established patient , follow up form a fall out of bed, and had small SDH.            HISTORY OF PRESENT ILLNESS:  Tyler Hayes is a 78 y.o. 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 No RLS.  Has mild jaw tremor.  Dx of PD, made in 2017-18. He had fallen out of his new bed, which is electronically adjustable in height, he had acted out on a dream, an fell, hit his head. 3 AM>  ED Note : 78 year old male present emergency department for fall with laceration to his right eyelid.  Last tetanus shot was several years ago, but within the last 10 years per patient and wife report.  He notes that he has Parkinson's and has night behaviors.  He is unsure what happened, but seemingly rolled over out of the bed and hit head on the nightstand.  Has been pain around the area and slight headache, but no vision changes, neck pain, chest pain, no abdominal pain.  Has no other injuries or complaints other than the laceration to his head.   Today undergoing MOCA/ MMSE.    Tyler Hayes is a 78 y.o. male patient who is here for revisit 11/27/2022 for  PD follow-up. He also has rheumatoid arthritis.   Parkinson disease dx in 2017-18, first manifested as REM BD. He noted this time he had taken a shower,  was in heat and humidity and felt weak. He felt too weak to safely leave the home, walked into the bedroom, wanted to sit down and missed the seat- fell to the floor.  EMS called as he could not rise and his wife cannot lift him either-, hospitalized for 2 days with pneumonia.  10-06-2022 in ED - admitted on 5-21 until 5-23. Tyler Hayes is a 78 y.o. 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  His MRI  from 07-24-2022 was very suspicious for NPH-    "MRI scan of the brain with and without contrast showing mild age-related changes of chronic small vessel disease and generalized atrophy.  There is moderate degree of generalized ventriculomegaly which appears out of proportion to the degree of atrophy and patient's age.  No obstructive lesions are noted.  No abnormal enhancement noted."  I showed the images and explained the changes- and the diagnosis- needing LP, pre and por st LP physical therapy evaluation. he has mild cognitive impairment- parkinsonian features, resting tremor,  impaired penmanship- no urinary incontinence. Had prostate cancer.    PS : his GI system is "unhappy with the increased dose of aricept to 10 mg" . We will go to 5 mg bid for better tolerance. If that doesn't work , will go to a patch.        Review of Systems: Out of a complete 14 system review, the patient complains of only the following symptoms, and all other reviewed systems are negative.:  MCI,  REM BD, Dysphonia.     Social History  Socioeconomic History   Marital status: Married    Spouse name: Not on file   Number of children: 3   Years of education: Not on file   Highest education level: Not on file  Occupational History   Not on file  Tobacco Use   Smoking status: Former    Current packs/day: 0.00    Average packs/day: 1.5 packs/day for 20.0 years (30.0 ttl pk-yrs)    Types: Cigarettes    Start date: 02/01/1971    Quit date: 02/01/1991    Years since quitting: 32.3   Smokeless tobacco: Never  Vaping Use   Vaping status: Never Used  Substance and Sexual Activity   Alcohol use: Yes    Comment: occasionally   Drug use: No   Sexual activity: Not on file  Other Topics Concern   Not on file  Social History Narrative   Pt lives with  wife    Pt retired    Chief Executive Officer Drivers of  Corporate investment banker Strain: Low Risk  (01/27/2020)   Received from [facility]   Overall Financial Resource Strain (CARDIA)    Difficulty of Paying Living Expenses: Not hard at all  Food Insecurity: No Food Insecurity (10/07/2022)   Hunger Vital Sign    Worried About Running Out of Food in the Last Year: Never true    Ran Out of Food in the Last Year: Never true  Transportation Needs: No Transportation Needs (10/07/2022)   PRAPARE - Administrator, Civil Service (Medical): No    Lack of Transportation (Non-Medical): No  Physical Activity: Not on file  Stress: Not on file  Social Connections: Unknown (10/02/2021)   [facility]   Social Network    Social Network: Not on file    Family History  Problem Relation Age of Onset   Heart failure Mother    CAD Mother    Cancer Father    Parkinsonism Father    CAD Sister        Brain tumor   Stroke Sister    Stroke Sister    Coronary artery disease Sister    Hypertension Son    Diabetes Son    Obesity Son    Sleep apnea Son    Heart murmur Son        No major structural issues noted.   Alzheimer's disease Neg Hx    Dementia Neg Hx     Past Medical History:  Diagnosis Date   Arthritis    back   Cancer (HCC)    prostate   Diabetes mellitus without complication (HCC)    on meds   Diverticulosis    Hypercholesteremia    Hypertension    Meatal stenosis    Osteoarthritis    Parkinson disease (HCC)    Sinus drainage    Tinea pedis     Past Surgical History:  Procedure Laterality Date   BACK SURGERY  1984, 2013   lumbar   COLONOSCOPY  06/10/2019   Dr. Bosie Clos   DG DILATION URETERS  2003   EYE SURGERY Bilateral 1999   Lasik   LEFT HEART CATH AND CORONARY ANGIOGRAPHY N/A 05/26/2018   Procedure: LEFT HEART CATH AND CORONARY ANGIOGRAPHY;  Surgeon: Elder Negus, MD;  Location: [facility] INVASIVE CV LAB;  Service: Cardiovascular;   Laterality: N/A;   POSTERIOR LUMBAR FUSION 4 LEVEL N/A 06/01/2014   Procedure: LUMBAR THREE TO FOUR, LUMBAR FOUR TO  FIVE LAMINECTOMY,  RIGHT LUMBAR FUSION AT LUMBAR FOUR TO FIVE;  Surgeon: Karn Cassis, MD;  Location: [facility] NEURO ORS;  Service: Neurosurgery;  Laterality: N/A;  POSSIBLE L2-3 L3-4 L4-5 L5-S1 POSTERIOR LUMBAR INTERBODY FUSION   PROSTATECTOMY  2002     Current Outpatient Medications on File Prior to Visit  Medication Sig Dispense Refill   Abatacept (ORENCIA IV) Inject into the vein. Gets every 4 weeks for awhile and then goes to monthly infusions     albuterol (VENTOLIN HFA) 108 (90 Base) MCG/ACT inhaler Inhale 1-2 puffs into the lungs every 6 (six) hours as needed for wheezing or shortness of breath.     amLODipine (NORVASC) 2.5 MG tablet Take 2.5 mg by mouth daily.     aspirin 81 MG chewable tablet Chew 81 mg by mouth daily.     atorvastatin (LIPITOR) 80 MG tablet Take 1 tablet (80 mg total) by mouth daily. 90 tablet 3   carbidopa-levodopa (SINEMET IR) 25-100 MG tablet Take 1 tablet by mouth 3 (three) times daily. TAKE 1 TABLET 3 TIMES A DAYWITH WATER 30 MINUTES      BEFORE MEALS 90 tablet 5   carvedilol (COREG) 6.25 MG tablet TAKE 1 TABLET BY MOUTH TWICE A DAY 180 tablet 3   Cholecalciferol 50 MCG (2000 UT) TABS Take 2,000 Units by mouth daily.     clonazePAM (KLONOPIN) 0.5 MG tablet At bedtime 1 tab and if needed add a half tab later in the night. REM BD 45 tablet 5   fluticasone (FLONASE) 50 MCG/ACT nasal spray Place 1 spray into both nostrils daily as needed for rhinitis (drainage issues.).      KLOR-CON M10 10 MEQ tablet Take 10 mEq by mouth daily.     leflunomide (ARAVA) 20 MG tablet Take 20 mg by mouth daily.     losartan (COZAAR) 100 MG tablet Take 100 mg by mouth at bedtime.     memantine (NAMENDA) 10 MG tablet Take 1 tablet (10 mg total) by mouth 2 (two) times daily. 180 tablet 1   montelukast (SINGULAIR) 10 MG tablet Take 10 mg by mouth every evening.     SYNJARDY XR  09-998 MG TB24 Take 2 tablets by mouth daily.      No current facility-administered medications on file prior to visit.    Allergies  Allergen Reactions   Other Anaphylaxis   Ace Inhibitors Cough    LISINOPRIL   Antihistamines, Diphenhydramine-Type Other (See Comments)    Other reaction(s): Unknown     DIAGNOSTIC DATA (LABS, IMAGING, TESTING) - I reviewed patient records, labs, notes, testing and imaging myself where available.  Lab Results  Component Value Date   WBC 5.4 10/08/2022   HGB 11.5 (L) 10/08/2022   HCT 36.5 (L) 10/08/2022   MCV 92.2 10/08/2022   PLT 205 10/08/2022      Component Value Date/Time   NA 137 10/08/2022 0741   NA 143 07/11/2022 1023   K 3.9 10/08/2022 0741   CL 106 10/08/2022 0741   CO2 22 10/08/2022 0741   GLUCOSE 93 10/08/2022 0741   BUN 9 10/08/2022 0741   BUN 10 07/11/2022 1023   CREATININE 1.01 10/08/2022 0741   CALCIUM 8.9 10/08/2022 0741   PROT 6.9 10/08/2022 0741   PROT 6.7 07/11/2022 1023   ALBUMIN 3.0 (L) 10/08/2022 0741   ALBUMIN 4.6 07/11/2022 1023   AST 22 10/08/2022 0741   ALT 17 10/08/2022 0741   ALKPHOS 51 10/08/2022 0741   BILITOT 1.2  10/08/2022 0741   BILITOT 0.4 07/11/2022 1023   GFRNONAA >60 10/08/2022 0741   GFRAA 72 (L) 05/24/2014 1221   Lab Results  Component Value Date   CHOL 129 01/08/2023   HDL 50 01/08/2023   LDLCALC 68 01/08/2023   TRIG 46 01/08/2023   CHOLHDL 2.6 01/08/2023   No results found for: "HGBA1C" No results found for: "VITAMINB12" No results found for: "TSH"  PHYSICAL EXAM:  Today's Vitals   06/11/23 0937  BP: 126/76  Pulse: 75  Weight: 183 lb (83 kg)  Height: 5\' 10"  (1.778 m)   Body mass index is 26.26 kg/m.   Wt Readings from Last 3 Encounters:  06/11/23 183 lb (83 kg)  03/05/23 174 lb (78.9 kg)  12/13/22 174 lb 9.6 oz (79.2 kg)     Ht Readings from Last 3 Encounters:  06/11/23 5\' 10"  (1.778 m)  12/13/22 5' 10.5" (1.791 m)  11/27/22 5\' 10"  (1.778 m)      General: The  patient is awake, alert and appears not in acute distress. The patient is well groomed. Head: Normocephalic, atraumatic. Neck is supple. The patient is awake, alert and  well groomed. Head: Normocephalic, atraumatic.  Neck is supple. Mallampati 2 neck circumference:16.25,  Nasal airflow patent.   No Retrognathia - Full dentures.  Cardiovascular:  Regular rate and rhythm, without murmurs or carotid bruit, and without distended neck veins. Respiratory: Lungs are clear to auscultation. Skin:  Without evidence of edema, or rash Trunk: BMI is 25 again .  The patient's posture is still erect. He is cooperative, pleasant,  fatigued. .    Neurologic exam : The patient is awake and alert, oriented to place and time.   Memory subjective described as intact.      06/11/2023    9:49 AM 11/27/2022    9:35 AM 07/11/2022    9:40 AM 01/08/2022    9:30 AM 09/04/2021   10:22 AM  Montreal Cognitive Assessment   Visuospatial/ Executive (0/5) 4 4 4 4 5   Naming (0/3) 3 3 3 3 3   Attention: Read list of digits (0/2) 1 1 2 2 2   Attention: Read list of letters (0/1) 1 1 1 1 1   Attention: Serial 7 subtraction starting at 100 (0/3) 3 3 3 3 3   Language: Repeat phrase (0/2) 2 2 1 1 1   Language : Fluency (0/1) 1 1 1 1  0  Abstraction (0/2) 2 2 2 2 2   Delayed Recall (0/5) 1 2 1 1 1   Orientation (0/6) 6 6 6 6 6   Total 24 25 24 24 24   Adjusted Score (based on education)     24    Attention span & concentration ability appears normal.  His wife also does not feel that he has memory loss. Speech is fluent, with dysphonia. Mood and affect are appropriate, concerned. Reports some frustration.   Cranial nerves: Preserved sense of taste but loss of smell.  Pupils are equally reactive to light. Right eye status post cataract surgery.  Extraocular movements in vertical and horizontal planes intact and without nystagmus.  Visual fields by finger perimetry are intact. He blinks frequently  Hearing to finger rub intact.  Facial sensation intact to fine touch.  Facial motor strength is symmetric and tongue and uvula move midline.  Quivering of the lips, no titubation. Shoulder shrug was symmetrical.  Motor exam:  Mr. Fredia Sorrow presents with cog-wheeling rigidity over the right biceps only, slight increase in tone at the right wrist.  There  is a right hand resting tremor and action tremor noted.there is mild resting tremor in right hand , too.   None of these findings in the left hand.He noticed a change in his handwriting and that he has to concentrate on bringing food to his mouth.  Sensory:  Fine touch, pinprick and vibration were felt normal. Coordination: Rapid alternating movements in the fingers/hands was not slowed.   Finger-to-nose maneuver with right hand tremor, left dysmetria.   He feels clumsy- with handwriting changes.  Gait and station: Patient walks without assistive device ., he has trouble to rise,  yet he could rise with his arms crossed in front of his chest- and he walks slightly stooped, small steps and fragmented turns.  Arm swing is reduced  He is stooped, and takes small steps- - he tries to look at his feet while walking. He turned with 5 steps. Stance is stable and normal.   Deep tendon reflexes: in the upper and lower extremities are symmetric and intact. Babinski deferred.   ASSESSMENT AND PLAN 78 y.o. year old male  here with:    1) PD with stiffness soreness, first manifestation was REM BD. Also has rheumatoid arthritis.  Resting tremor is mild, more chin tremor, dysphonia, no dysarthria and no dysphagia.  Propulsion.   2) fall out of bed, REM BD- no injury sequalae present. CT shows dilated ventricles and widening of the temporal sulci.   3) MOCA every 6 - 9 months   I plan to follow up either personally or through our NP within 6-8 months.   I would like to thank Merri Brunette, Md [address] [place],  [place] [address] for allowing me to meet with and to take  care of this pleasant patient.    After spending a total time of  35  minutes face to face and additional time for physical and neurologic examination, review of laboratory studies,  personal review of imaging studies, reports and results of other testing and review of referral information / records as far as provided in visit,   Electronically signed by: Melvyn Novas, MD 06/11/2023 10:02 AM  [facility] Board certified by The [employer] of Sleep Medicine and Diplomate of the [employer] of Sleep Medicine. Board certified In Neurology through the ABPN, Fellow of the [employer] of Neurology.

## 2023-06-11 NOTE — Patient Instructions (Signed)
Parkinson's Disease Parkinson's disease causes problems with movement. It makes it harder for you to walk or control your body. Each person with the disease is affected differently. Treatments can help you manage your symptoms. Parkinson's disease can range from mild to very bad, but it gets worse over time. This often happens slowly over many years. What are the causes? Parkinson's disease is caused by a loss of brain cells called neurons. These brain cells make a substance called dopamine, which is needed to control body movement. It's not known what causes the brain cells to die. What increases the risk? Being male. Being 32 years of age or older. Having a family history of Parkinson's disease. Having an injury to the brain in the past. Being around things that are harmful or poisonous, such as pesticides. Having depression. This is when you feel sad or hopeless. What are the signs or symptoms? Symptoms can vary and get worse over time. The main symptoms can be seen in your movement. These include: Shaking or tremors that you can't control. This happens while you're resting. Stiffness in your arms and legs. Losing facial expressions. Walking in a way that isn't normal. You may walk with short, shuffling steps. Loss of balance when standing. You may sway, fall backward, or have trouble making turns. Other symptoms include: Being very sad, worried, or nervous. Having delusions. These are strong beliefs that aren't true. Having hallucinations. This is when you see, hear, taste, smell, or feel things that aren't real. Trouble speaking or swallowing. Trouble pooping (constipation). Needing to pee (urinate) right away, peeing often, or losing control of when you pee or poop. Sleep problems. How is this diagnosed? A diagnosis may be made based on symptoms, your medical history, and a physical exam. You may also have imaging tests that make pictures of your brain. How is this treated? There  is no cure for Parkinson's disease. The goal of treatment is to manage your symptoms. It may include: Medicines. Therapy to help with talking or movement. Surgery to reduce shaking and other movements that you can't control. Follow these instructions at home: Medicines Take your medicines only as told by your health care provider. Avoid taking pain or sleeping medicines. These can affect your thinking. Activity Ask your provider if it's safe for you to drive. Exercise as told by your provider or physical therapist. Lifestyle  Put grab bars and railings in your home, especially near the toilet and in the shower. These help prevent falls. Do not smoke, vape, or use products with nicotine or tobacco in them. If you need help quitting, talk with your provider. Do not drink alcohol. General instructions Talk with your provider to find out what kind of help you need at home. Ask about ways to stay safe. Join a support group for people with Parkinson's disease. Where to find more information [employer] of Neurological Disorders and Stroke: Basic[employer].no Parkinson's Foundation: parkinson.org Contact a health care provider if: Medicines don't help your symptoms. You have a lot of side effects from your medicines. You keep losing your balance or you fall. You need more help at home. You have trouble swallowing. You have a very hard time pooping. You feel very sad, worried, or confused. You see, hear, taste, smell, or feel things that aren't real. Get help right away if: You were hurt in a fall. You can't swallow without choking. You have chest pain or trouble breathing. You don't feel safe at home. These symptoms may be an emergency.  Call 911 right away. Do not wait to see if the symptoms will go away. Do not drive yourself to the hospital. Also, get help right away if: You feel like you may hurt yourself or others. You have thoughts about taking your own life. Take one of  these steps: Go to your nearest emergency room. Call 911. Call the National Suicide Prevention Lifeline at 919-766-2411 or 988. Text the Crisis Text Line at 4324995006. This information is not intended to replace advice given to you by your health care provider. Make sure you discuss any questions you have with your health care provider. Document Revised: 02/06/2023 Document Reviewed: 07/22/2022 Elsevier Patient Education  2024 [employer].

## 2023-06-25 DIAGNOSIS — M0609 Rheumatoid arthritis without rheumatoid factor, multiple sites: Secondary | ICD-10-CM | POA: Diagnosis not present

## 2023-07-03 ENCOUNTER — Ambulatory Visit (INDEPENDENT_AMBULATORY_CARE_PROVIDER_SITE_OTHER): Payer: Medicare Other | Admitting: Podiatry

## 2023-07-03 ENCOUNTER — Encounter: Payer: Self-pay | Admitting: Podiatry

## 2023-07-03 DIAGNOSIS — M79675 Pain in left toe(s): Secondary | ICD-10-CM | POA: Diagnosis not present

## 2023-07-03 DIAGNOSIS — M79674 Pain in right toe(s): Secondary | ICD-10-CM

## 2023-07-03 DIAGNOSIS — B351 Tinea unguium: Secondary | ICD-10-CM

## 2023-07-03 DIAGNOSIS — N1831 Chronic kidney disease, stage 3a: Secondary | ICD-10-CM | POA: Diagnosis not present

## 2023-07-03 NOTE — Progress Notes (Signed)
This patient presents to the office with chief complaint of long thick painful nails.  Patient says the nails are painful walking and wearing shoes.  This patient is unable to self treat.  This patient is unable to trim his nails since he is unable to reach his nails.  He has been diagnosed with Parkinsons. and type 2 diabetes.  he presents to the office for preventative foot care services.  General Appearance  Alert, conversant and in no acute stress.  Vascular  Dorsalis pedis and posterior tibial  pulses are palpable  bilaterally.  Capillary return is within normal limits  bilaterally. Temperature is within normal limits  bilaterally.  Neurologic  Senn-Weinstein monofilament wire test within normal limits  bilaterally. Muscle power within normal limits bilaterally.  Nails Thick disfigured discolored nails with subungual debris  from hallux to fifth toes bilaterally. No evidence of bacterial infection or drainage bilaterally.  Orthopedic  No limitations of motion  feet .  No crepitus or effusions noted.  No bony pathology or digital deformities noted.  Skin  normotropic skin with no porokeratosis noted bilaterally.  No signs of infections or ulcers noted.     Onychomycosis  Nails  B/L.  Pain in right toes  Pain in left toes  Debridement of nails both feet followed trimming the nails with dremel tool.    RTC  3  months.   Helane Gunther DPM

## 2023-07-15 ENCOUNTER — Other Ambulatory Visit: Payer: Self-pay | Admitting: Diagnostic Neuroimaging

## 2023-07-16 NOTE — Telephone Encounter (Signed)
 Last seen on 06/11/23 Follow up scheduled on 02/11/24

## 2023-07-23 DIAGNOSIS — M0609 Rheumatoid arthritis without rheumatoid factor, multiple sites: Secondary | ICD-10-CM | POA: Diagnosis not present

## 2023-08-22 DIAGNOSIS — M0609 Rheumatoid arthritis without rheumatoid factor, multiple sites: Secondary | ICD-10-CM | POA: Diagnosis not present

## 2023-08-22 DIAGNOSIS — M8580 Other specified disorders of bone density and structure, unspecified site: Secondary | ICD-10-CM | POA: Diagnosis not present

## 2023-08-22 DIAGNOSIS — I428 Other cardiomyopathies: Secondary | ICD-10-CM | POA: Diagnosis not present

## 2023-08-22 DIAGNOSIS — R768 Other specified abnormal immunological findings in serum: Secondary | ICD-10-CM | POA: Diagnosis not present

## 2023-08-22 DIAGNOSIS — M2550 Pain in unspecified joint: Secondary | ICD-10-CM | POA: Diagnosis not present

## 2023-08-22 DIAGNOSIS — M25569 Pain in unspecified knee: Secondary | ICD-10-CM | POA: Diagnosis not present

## 2023-08-22 DIAGNOSIS — M545 Low back pain, unspecified: Secondary | ICD-10-CM | POA: Diagnosis not present

## 2023-08-22 DIAGNOSIS — M1990 Unspecified osteoarthritis, unspecified site: Secondary | ICD-10-CM | POA: Diagnosis not present

## 2023-08-22 DIAGNOSIS — Z79899 Other long term (current) drug therapy: Secondary | ICD-10-CM | POA: Diagnosis not present

## 2023-08-27 ENCOUNTER — Encounter: Payer: Self-pay | Admitting: Cardiology

## 2023-08-27 ENCOUNTER — Ambulatory Visit: Attending: Cardiology | Admitting: Cardiology

## 2023-08-27 VITALS — BP 138/88 | HR 81 | Resp 16 | Ht 70.0 in | Wt 179.6 lb

## 2023-08-27 DIAGNOSIS — E782 Mixed hyperlipidemia: Secondary | ICD-10-CM | POA: Insufficient documentation

## 2023-08-27 DIAGNOSIS — I10 Essential (primary) hypertension: Secondary | ICD-10-CM | POA: Diagnosis not present

## 2023-08-27 DIAGNOSIS — I428 Other cardiomyopathies: Secondary | ICD-10-CM | POA: Diagnosis not present

## 2023-08-27 DIAGNOSIS — I2510 Atherosclerotic heart disease of native coronary artery without angina pectoris: Secondary | ICD-10-CM | POA: Diagnosis not present

## 2023-08-27 DIAGNOSIS — Z125 Encounter for screening for malignant neoplasm of prostate: Secondary | ICD-10-CM | POA: Diagnosis not present

## 2023-08-27 DIAGNOSIS — E118 Type 2 diabetes mellitus with unspecified complications: Secondary | ICD-10-CM | POA: Diagnosis not present

## 2023-08-27 NOTE — Progress Notes (Signed)
  Cardiology Office Note:  .   Date:  08/27/2023  ID:  Tyler Hayes, DOB 10-07-1945, MRN [id number] PCP: Merri Brunette, MD  [facility] Providers Cardiologist:  Truett Mainland, MD PCP: Merri Brunette, MD  Chief Complaint  Patient presents with   Hypertension   Follow-up     Tyler Hayes is a 78 y.o. 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  He denies chest pain, shortness of breath, palpitations, leg edema, orthopnea, PND, TIA/syncope.  He has had occasional falls rolling out of bed at night.  He is noted to have some sleep behavioral changes.  Fortunately, he has not had any daytime falls.  Parkinson's is well-controlled.    Vitals:   08/27/23 1030  BP: 138/88  Pulse: 81  Resp: 16  SpO2: 94%      Review of Systems  Cardiovascular:  Negative for chest pain, dyspnea on exertion, leg swelling, palpitations and syncope.        Studies Reviewed: Marland Kitchen        Independently interpreted 8-04/2023: Chol 129, TG 46, HDL 50, LDL 68 HbA1C 5.4% Hb 13.3 Cr 1.1 TSH 2.1   Physical Exam Vitals and nursing note reviewed.  Constitutional:      General: He is not in acute distress. Neck:     Vascular: No JVD.  Cardiovascular:     Rate and Rhythm: Normal rate and regular rhythm.     Heart sounds: Normal heart sounds. No murmur heard. Pulmonary:     Effort: Pulmonary effort is normal.     Breath sounds: Normal breath sounds. No wheezing or rales.  Musculoskeletal:     Right lower leg: No edema.     Left lower leg: No edema.      VISIT DIAGNOSES:   ICD-10-CM   1. Essential hypertension  I10 ECHOCARDIOGRAM COMPLETE    2. Mixed hyperlipidemia  E78.2     3. Nonischemic cardiomyopathy (HCC)  I42.8     4. Coronary artery disease involving native coronary artery of native heart without angina  pectoris  I25.10        Tyler Hayes is a 78 y.o. 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  With his Parkinson disease, I would avoid any hypotension.  No change made today to his baseline antihypertensive therapy  Elevated coronary calcium score: Total score of 443, including 33 in left main. No anginal symptoms at this time. Stress test with no ischemia (09/2020).  Negative stress LVEF, similar to previous study in 2020. Coronary angiogram in 2020 with no significant CAD. Continue aspirin 81 mg daily, Lipitor 80 mg daily. Lipids very well controlled.    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 EF 42% without any heart failure signs/symptoms. Reasonable not to use anticoagulation.  Last echocardiogram in 2022, recheck now.   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 Repeat ultrasound in 2030 Continue statin   Type 2 DM: Continue current therapy. Managed by PCP. Assessment and Plan      F/u in 1 year  Signed, Elder Negus, MD

## 2023-08-27 NOTE — Progress Notes (Unsigned)
   Rubin Payor, PhD, LAT, ATC acting as a scribe for Clementeen Graham, MD.  Travoris Bushey is a 78 y.o. male who presents to [facility] today for low back and hip pain. Pt was last seen by Dr. Denyse Amass on 05/16/21 for L thigh pain thought to be do to lumbar radiculopathy.  Today, pt reports pain returning???***. Pt locates pain to ***  Radiating pain: LE numbness/tingling: LE weakness: Aggravates: Treatments tried:  Dx imaging: 10/07/22 L-spine MRI  05/16/21 L-spine & L hip XR  Pertinent review of systems: ***  Relevant historical information: ***   Exam:  There were no vitals taken for this visit. General: Well Developed, well nourished, and in no acute distress.   MSK: ***    Lab and Radiology Results No results found for this or any previous visit (from the past 72 hours). No results found.     Assessment and Plan: 78 y.o. male with ***   PDMP not reviewed this encounter. No orders of the defined types were placed in this encounter.  No orders of the defined types were placed in this encounter.    Discussed warning signs or symptoms. Please see discharge instructions. Patient expresses understanding.   ***

## 2023-08-27 NOTE — Patient Instructions (Signed)
 Testing/Procedures: Echo  Your physician has requested that you have an echocardiogram. Echocardiography is a painless test that uses sound waves to create images of your heart. It provides your doctor with information about the size and shape of your heart and how well your heart's chambers and valves are working. This procedure takes approximately one hour. There are no restrictions for this procedure. Please do NOT wear cologne, perfume, aftershave, or lotions (deodorant is allowed). Please arrive 15 minutes prior to your appointment time.  Please note: We ask at that you not bring children with you during ultrasound (echo/ vascular) testing. Due to room size and safety concerns, children are not allowed in the ultrasound rooms during exams. Our front office staff cannot provide observation of children in our lobby area while testing is being conducted. An adult accompanying a patient to their appointment will only be allowed in the ultrasound room at the discretion of the ultrasound technician under special circumstances. We apologize for any inconvenience.   Follow-Up: At [facility], you and your health needs are our priority.  As part of our continuing mission to provide you with exceptional heart care, our providers are all part of one team.  This team includes your primary Cardiologist (physician) and Advanced Practice Providers or APPs (Physician Assistants and Nurse Practitioners) who all work together to provide you with the care you need, when you need it.  Your next appointment:   1 year(s)  Provider:   Elder Negus, MD     We recommend signing up for the patient portal called "MyChart".  Sign up information is provided on this After Visit Summary.  MyChart is used to connect with patients for Virtual Visits (Telemedicine).  Patients are able to view lab/test results, encounter notes, upcoming appointments, etc.  Non-urgent messages can be sent to your provider as  well.   To learn more about what you can do with MyChart, go to [email or web address].   Other Instructions       1st Floor: - Lobby - Registration  - Pharmacy  - Lab - Cafe  2nd Floor: - PV Lab - Diagnostic Testing (echo, CT, nuclear med)  3rd Floor: - Vacant  4th Floor: - TCTS (cardiothoracic surgery) - AFib Clinic - Structural Heart Clinic - Vascular Surgery  - Vascular Ultrasound  5th Floor: - [facility] (general and EP) - Clinical Pharmacy for coumadin, hypertension, lipid, weight-loss medications, and med management appointments    Valet parking services will be available as well.

## 2023-08-28 ENCOUNTER — Other Ambulatory Visit: Payer: Self-pay | Admitting: Diagnostic Neuroimaging

## 2023-08-28 LAB — LAB REPORT - SCANNED
A1c: 5.3
Albumin, Urine POC: 16.8
Albumin/Creatinine Ratio, Urine, POC: 20
Creatinine, POC: 83 mg/dL
EGFR: 66
HM Hepatitis Screen: NEGATIVE

## 2023-08-29 ENCOUNTER — Encounter: Payer: Self-pay | Admitting: Family Medicine

## 2023-08-29 ENCOUNTER — Ambulatory Visit (INDEPENDENT_AMBULATORY_CARE_PROVIDER_SITE_OTHER)

## 2023-08-29 ENCOUNTER — Ambulatory Visit (INDEPENDENT_AMBULATORY_CARE_PROVIDER_SITE_OTHER): Admitting: Family Medicine

## 2023-08-29 VITALS — BP 132/86 | HR 81 | Ht 70.0 in | Wt 181.0 lb

## 2023-08-29 DIAGNOSIS — M545 Low back pain, unspecified: Secondary | ICD-10-CM | POA: Diagnosis not present

## 2023-08-29 DIAGNOSIS — M419 Scoliosis, unspecified: Secondary | ICD-10-CM | POA: Diagnosis not present

## 2023-08-29 DIAGNOSIS — M47816 Spondylosis without myelopathy or radiculopathy, lumbar region: Secondary | ICD-10-CM | POA: Diagnosis not present

## 2023-08-29 DIAGNOSIS — Z981 Arthrodesis status: Secondary | ICD-10-CM | POA: Diagnosis not present

## 2023-08-29 NOTE — Patient Instructions (Addendum)
 Thank you for coming in today.  Xray  PT See me in 6 weeks

## 2023-09-03 DIAGNOSIS — R413 Other amnesia: Secondary | ICD-10-CM | POA: Diagnosis not present

## 2023-09-03 DIAGNOSIS — I2510 Atherosclerotic heart disease of native coronary artery without angina pectoris: Secondary | ICD-10-CM | POA: Diagnosis not present

## 2023-09-03 DIAGNOSIS — E118 Type 2 diabetes mellitus with unspecified complications: Secondary | ICD-10-CM | POA: Diagnosis not present

## 2023-09-03 DIAGNOSIS — R9430 Abnormal result of cardiovascular function study, unspecified: Secondary | ICD-10-CM | POA: Diagnosis not present

## 2023-09-03 DIAGNOSIS — Z0000 Encounter for general adult medical examination without abnormal findings: Secondary | ICD-10-CM | POA: Diagnosis not present

## 2023-09-03 DIAGNOSIS — G4752 REM sleep behavior disorder: Secondary | ICD-10-CM | POA: Diagnosis not present

## 2023-09-03 DIAGNOSIS — J4521 Mild intermittent asthma with (acute) exacerbation: Secondary | ICD-10-CM | POA: Diagnosis not present

## 2023-09-03 DIAGNOSIS — I10 Essential (primary) hypertension: Secondary | ICD-10-CM | POA: Diagnosis not present

## 2023-09-03 DIAGNOSIS — G20C Parkinsonism, unspecified: Secondary | ICD-10-CM | POA: Diagnosis not present

## 2023-09-03 DIAGNOSIS — J309 Allergic rhinitis, unspecified: Secondary | ICD-10-CM | POA: Diagnosis not present

## 2023-09-08 ENCOUNTER — Ambulatory Visit: Admitting: Family Medicine

## 2023-09-08 ENCOUNTER — Ambulatory Visit: Admitting: Physical Therapy

## 2023-09-08 NOTE — Progress Notes (Signed)
 Low back x-ray shows medium arthritis and a prior fusion.  The fusion looks okay.

## 2023-09-11 ENCOUNTER — Ambulatory Visit: Payer: Self-pay | Admitting: Cardiology

## 2023-09-16 ENCOUNTER — Ambulatory Visit: Payer: Medicare Other | Admitting: Cardiology

## 2023-09-17 ENCOUNTER — Ambulatory Visit: Admitting: Physical Therapy

## 2023-09-17 ENCOUNTER — Encounter: Payer: Self-pay | Admitting: Physical Therapy

## 2023-09-17 ENCOUNTER — Other Ambulatory Visit: Payer: Self-pay

## 2023-09-17 DIAGNOSIS — M6281 Muscle weakness (generalized): Secondary | ICD-10-CM

## 2023-09-17 DIAGNOSIS — M5459 Other low back pain: Secondary | ICD-10-CM | POA: Diagnosis not present

## 2023-09-17 NOTE — Therapy (Signed)
 OUTPATIENT PHYSICAL THERAPY EVALUATION   Patient Name: Tyler Hayes MRN: [id number] DOB:08-04-1945, 78 y.o., male Today's Date: 09/17/2023   END OF SESSION:  PT End of Session - 09/17/23 1337     Visit Number 1    Number of Visits 9    Date for PT Re-Evaluation 11/12/23    Authorization Type MCR    Progress Note Due on Visit 10    PT Start Time 1150    PT Stop Time 1235    PT Time Calculation (min) 45 min    Activity Tolerance Patient tolerated treatment well    Behavior During Therapy [facility] for tasks assessed/performed             Past Medical History:  Diagnosis Date   Arthritis    back   Cancer (HCC)    prostate   Diabetes mellitus without complication (HCC)    on meds   Diverticulosis    Hypercholesteremia    Hypertension    Meatal stenosis    Osteoarthritis    Parkinson disease (HCC)    Sinus drainage    Tinea pedis    Past Surgical History:  Procedure Laterality Date   BACK SURGERY  1984, 2013   lumbar   COLONOSCOPY  06/10/2019   Dr. Honey Lusty   DG DILATION URETERS  2003   EYE SURGERY Bilateral 1999   Lasik   LEFT HEART CATH AND CORONARY ANGIOGRAPHY N/A 05/26/2018   Procedure: LEFT HEART CATH AND CORONARY ANGIOGRAPHY;  Surgeon: Cody Das, MD;  Location: [facility] INVASIVE CV LAB;  Service: Cardiovascular;  Laterality: N/A;   POSTERIOR LUMBAR FUSION 4 LEVEL N/A 06/01/2014   Procedure: LUMBAR THREE TO FOUR, LUMBAR FOUR TO FIVE LAMINECTOMY,  RIGHT LUMBAR FUSION AT LUMBAR FOUR TO FIVE;  Surgeon: Adelbert Adler, MD;  Location: [facility] NEURO ORS;  Service: Neurosurgery;  Laterality: N/A;  POSSIBLE L2-3 L3-4 L4-5 L5-S1 POSTERIOR LUMBAR INTERBODY FUSION   PROSTATECTOMY  2002   Patient Active Problem List   Diagnosis Date Noted   Pneumonia 10/07/2022   T2DM (type 2 diabetes mellitus) (HCC) 10/07/2022   Parkinsonism (HCC) 08/28/2022   NPH (normal pressure hydrocephalus) (HCC) 08/28/2022   Stage 3a chronic kidney disease (HCC) 07/11/2022   Pain due to  onychomycosis of toenails of both feet 06/05/2022   Dysphonia 02/28/2021   MCI (mild cognitive impairment) 02/28/2021   Coronary artery disease involving native coronary artery of native heart without angina pectoris 10/27/2020   Gait disturbance 08/29/2020   Essential tremor 08/29/2020   Elevated coronary artery calcium  score 08/24/2020   Primary parkinsonism (HCC) 05/04/2020   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   Mixed hyperlipidemia 01/25/2020   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HCC) 01/25/2020   Essential hypertension 11/04/2018   Abdominal aortic aneurysm (AAA) without rupture (HCC) 07/24/2018   Sleep behavior disorder, REM 07/21/2018   Abnormal stress test 05/26/2018   Exertional dyspnea 05/26/2018   Nonischemic cardiomyopathy (HCC) 05/26/2018   Tremor observed on examination 06/25/2017   Uncontrolled REM sleep behavior disorder 03/11/2016   Nightmares REM-sleep type 03/11/2016   Lumbar degenerative disc disease 06/01/2014    PCP: Imelda Man, MD  REFERRING PROVIDER: Syliva Even, MD  REFERRING DIAG: Lumbar spine pain  Rationale for Evaluation and Treatment: Rehabilitation  THERAPY DIAG:  Other low back pain  Muscle weakness (generalized)  ONSET DATE: Chronic, worsening over past 2 months   SUBJECTIVE:      SUBJECTIVE STATEMENT: Patient reports left sided  lower back/hip pain that seems to be painful after he has been sitting and then goes to stand up. Initially he is quite uncomfortable, but once he goes to move around then the pain will lessen. He also notes that getting out of the bed is uncomfortable. This has been worsening over the past 2 months. He reports always having a sensation or stiffness in the lower back even at rest. If walks for extended periods then he can start to feel a discomfort in the lower back, but also his motions start to get worse from the legs getting stiff. He does do fitness focused on  strengthening the upper body and legs. He has Parkinson's so he wants to keep strong and work on his balance and walking. He does have a cane he will use but tries not to use it if he doesn't need to.   PERTINENT HISTORY:  See PMH above  PAIN:  Are you having pain? Yes:  NPRS scale: 3-5/10 currently, 7-8/10 at rest Pain location: Left lower back  Pain description: Stiffness, sharp Aggravating factors: Standing after sitting, getting out of bed Relieving factors: Rest  PRECAUTIONS: None  RED FLAGS: None   WEIGHT BEARING RESTRICTIONS: No  FALLS:  Has patient fallen in last 6 months? No  LIVING ENVIRONMENT: Lives with: lives with their spouse Lives in: House/apartment (living areas are downstairs) Stairs: Yes: Internal: 1 flight steps; on left going up and can reach both and External: 3-4 steps; on right going up Has following equipment at home: Single point cane  PLOF: Independent  PATIENT GOALS: Decrease pain when standing from a chair   OBJECTIVE:  Note: Objective measures were completed at Evaluation unless otherwise noted. PATIENT SURVEYS:  PSFS: 5.5 Getting out of chair: 6 Bending and then being able to straighten back up: 5  COGNITION: Overall cognitive status: Within functional limits for tasks assessed   SENSATION: WFL  MUSCLE LENGTH: Limitations in bilateral hamstrings, hip flexor/quad  POSTURE:   Decreased lumbar lordosis, rounded shoulder posture  PALPATION: Tender to palpation at left L5 facet region  Global hypomobility of the lumbar spine, nonconcordant  LUMBAR ROM:   AROM eval  Flexion 50%  Extension 25%  Right lateral flexion 50%  Left lateral flexion 25%  Right rotation 50%  Left rotation 50%   (Blank rows = not tested)  LOWER EXTREMITY ROM:      Hip PROM grossly WFL except slight limitations with hip IR bilaterally  LOWER EXTREMITY MMT:    MMT Right eval Left eval  Hip flexion 4 4  Hip extension 3+ 3  Hip abduction 4- 3+   Hip adduction    Hip internal rotation    Hip external rotation    Knee flexion 5 5  Knee extension 5 5  Ankle dorsiflexion    Ankle plantarflexion    Ankle inversion    Ankle eversion     (Blank rows = not tested)  LUMBAR SPECIAL TESTS:  Lumbar radicular testing negative  FUNCTIONAL TESTS:  30 seconds chair stand test: 12 reps, patient reports left lower back pain  GAIT: Assistive device utilized: None Level of assistance: Complete Independence Comments: Antalgic on left especially after first getting up from chair   TREATMENT [facility] Adult PT Treatment:  DATE: 09/17/2023 Side clamshell with green x 15 Prone straight leg hip extension x 10 Bridge 10 x 5 sec  PATIENT EDUCATION:  Education details: Exam findings, POC, HEP Person educated: Patient Education method: Explanation, Demonstration, Tactile cues, Verbal cues, and Handouts Education comprehension: verbalized understanding, returned demonstration, verbal cues required, tactile cues required, and needs further education  HOME EXERCISE PROGRAM: Access Code: [email or web address]    ASSESSMENT: CLINICAL IMPRESSION: Patient is a 78 y.o. male who was seen today for physical therapy evaluation and treatment for chronic left sided lower back and posterior hip pain. The pain primarily occurs when he stands from a seated position and he has difficulty fully extending his hips/lower back, and exhibits antalgic gait for first few steps on the left. He does exhibit gross limitation of his lumbar mobility and flexibility deficits that are likely impacting his ability to stand upright after sitting extended periods, as well as strength deficits of his core and hip musculature contributing to his symptoms and impacting his functional ability.  OBJECTIVE IMPAIRMENTS: Abnormal gait, decreased activity tolerance, decreased ROM, decreased strength, impaired flexibility, postural dysfunction, and pain.    ACTIVITY LIMITATIONS: bending, sitting, standing, transfers, and locomotion level  PARTICIPATION LIMITATIONS: meal prep, cleaning, driving, and community activity  PERSONAL FACTORS: Fitness, Past/current experiences, and Time since onset of injury/illness/exacerbation are also affecting patient's functional outcome.   REHAB POTENTIAL: Good  CLINICAL DECISION MAKING: Stable/uncomplicated  EVALUATION COMPLEXITY: Low   GOALS: Goals reviewed with patient? Yes  SHORT TERM GOALS: Target date: 10/15/2023  Patient will be I with initial HEP in order to progress with therapy. Baseline: HEP provided at eval Goal status: INITIAL  2.  Patient will report left lower back pain </= 5/10 when standing from seated position in order to reduce functional limitations Baseline: 7-8/10 Goal status: INITIAL  LONG TERM GOALS: Target date: 11/12/2023  Patient will be I with final HEP to maintain progress from PT. Baseline: HEP provided at eval Goal status: INITIAL  2.  Patient will report PSFS >/= 8 in order to indicate improvement in his functional ability Baseline: 5.5 Goal status: INITIAL  3.  Patient will demonstrate hip strength >/= 4-/5 MMT in order to improve ability to stand and walk without limitation Baseline: see limitations above Goal status: INITIAL  4.  Patient will perform 30 sec stand test >/= 15 reps to indicate improvement in LE strength and mobility to reduce functional limitations Baseline: 12 reps Goal status: INITIAL   PLAN: PT FREQUENCY: 1x/week  PT DURATION: 8 weeks  PLANNED INTERVENTIONS: 97164- PT Re-evaluation, 97110-Therapeutic exercises, 97530- Therapeutic activity, 97112- Neuromuscular re-education, 97535- Self Care, 54098- Manual therapy, Patient/Family education, Balance training, Stair training, Dry Needling, Joint mobilization, Joint manipulation, Spinal manipulation, Spinal mobilization, Cryotherapy, and Moist heat.  PLAN FOR NEXT SESSION: Review HEP  and progress PRN, manual/mobs for lumbar spine and progressing lumbar mobility, hip and LE stretching for flexibility deficits, progress core and hip strengthening   Leah Primus, PT, DPT, LAT, ATC 09/17/23  3:52 PM Phone: [phone] Fax: [phone]

## 2023-09-17 NOTE — Patient Instructions (Signed)
 Access Code: [email or web address] URL: [email or web address] Date: 09/17/2023 Prepared by: Leah Primus  Exercises - Clam with Resistance  - 1 x daily - 3 sets - 15 reps - Prone Hip Extension  - 1 x daily - 3 sets - 10 reps - Supine Bridge  - 1 x daily - 2 sets - 10 reps - 5 seconds hold

## 2023-09-19 DIAGNOSIS — M0609 Rheumatoid arthritis without rheumatoid factor, multiple sites: Secondary | ICD-10-CM | POA: Diagnosis not present

## 2023-09-30 ENCOUNTER — Encounter: Payer: Self-pay | Admitting: Podiatry

## 2023-09-30 ENCOUNTER — Ambulatory Visit (INDEPENDENT_AMBULATORY_CARE_PROVIDER_SITE_OTHER): Admitting: Podiatry

## 2023-09-30 DIAGNOSIS — B351 Tinea unguium: Secondary | ICD-10-CM | POA: Diagnosis not present

## 2023-09-30 DIAGNOSIS — M79674 Pain in right toe(s): Secondary | ICD-10-CM | POA: Diagnosis not present

## 2023-09-30 DIAGNOSIS — M79675 Pain in left toe(s): Secondary | ICD-10-CM | POA: Diagnosis not present

## 2023-09-30 DIAGNOSIS — N1831 Chronic kidney disease, stage 3a: Secondary | ICD-10-CM

## 2023-09-30 NOTE — Progress Notes (Signed)
 This patient presents to the office with chief complaint of long thick painful nails.  Patient says the nails are painful walking and wearing shoes.  This patient is unable to self treat.  This patient is unable to trim his nails since he is unable to reach his nails.  He has been diagnosed with Parkinsons. and type 2 diabetes.  he presents to the office for preventative foot care services.  General Appearance  Alert, conversant and in no acute stress.  Vascular  Dorsalis pedis and posterior tibial  pulses are palpable  bilaterally.  Capillary return is within normal limits  bilaterally. Temperature is within normal limits  bilaterally.  Neurologic  Senn-Weinstein monofilament wire test within normal limits  bilaterally. Muscle power within normal limits bilaterally.  Nails Thick disfigured discolored nails with subungual debris  from hallux to fifth toes bilaterally. No evidence of bacterial infection or drainage bilaterally.  Orthopedic  No limitations of motion  feet .  No crepitus or effusions noted.  No bony pathology or digital deformities noted.  Skin  normotropic skin with no porokeratosis noted bilaterally.  No signs of infections or ulcers noted.     Onychomycosis  Nails  B/L.  Pain in right toes  Pain in left toes  Debridement of nails both feet followed trimming the nails with dremel tool.    RTC  3  months.   Helane Gunther DPM

## 2023-10-01 ENCOUNTER — Other Ambulatory Visit: Payer: Self-pay

## 2023-10-01 ENCOUNTER — Encounter: Payer: Self-pay | Admitting: Physical Therapy

## 2023-10-01 ENCOUNTER — Ambulatory Visit (INDEPENDENT_AMBULATORY_CARE_PROVIDER_SITE_OTHER): Admitting: Physical Therapy

## 2023-10-01 DIAGNOSIS — M6281 Muscle weakness (generalized): Secondary | ICD-10-CM | POA: Diagnosis not present

## 2023-10-01 DIAGNOSIS — M5459 Other low back pain: Secondary | ICD-10-CM

## 2023-10-01 NOTE — Therapy (Signed)
 OUTPATIENT PHYSICAL THERAPY TREATMENT   Patient Name: Tyler Hayes MRN: [id number] DOB:November 18, 1945, 78 y.o., male Today's Date: 10/01/2023   END OF SESSION:  PT End of Session - 10/01/23 1141     Visit Number 2    Number of Visits 9    Date for PT Re-Evaluation 11/12/23    Authorization Type MCR    Progress Note Due on Visit 10    PT Start Time 1147    PT Stop Time 1230    PT Time Calculation (min) 43 min    Activity Tolerance Patient tolerated treatment well    Behavior During Therapy [facility] for tasks assessed/performed              Past Medical History:  Diagnosis Date   Arthritis    back   Cancer (HCC)    prostate   Diabetes mellitus without complication (HCC)    on meds   Diverticulosis    Hypercholesteremia    Hypertension    Meatal stenosis    Osteoarthritis    Parkinson disease (HCC)    Sinus drainage    Tinea pedis    Past Surgical History:  Procedure Laterality Date   BACK SURGERY  1984, 2013   lumbar   COLONOSCOPY  06/10/2019   Dr. Honey Lusty   DG DILATION URETERS  2003   EYE SURGERY Bilateral 1999   Lasik   LEFT HEART CATH AND CORONARY ANGIOGRAPHY N/A 05/26/2018   Procedure: LEFT HEART CATH AND CORONARY ANGIOGRAPHY;  Surgeon: Cody Das, MD;  Location: [facility];  Service: Cardiovascular;  Laterality: N/A;   POSTERIOR LUMBAR FUSION 4 LEVEL N/A 06/01/2014   Procedure: LUMBAR THREE TO FOUR, LUMBAR FOUR TO FIVE LAMINECTOMY,  RIGHT LUMBAR FUSION AT LUMBAR FOUR TO FIVE;  Surgeon: Adelbert Adler, MD;  Location: [facility];  Service: Neurosurgery;  Laterality: N/A;  POSSIBLE L2-3 L3-4 L4-5 L5-S1 POSTERIOR LUMBAR INTERBODY FUSION   PROSTATECTOMY  2002   Patient Active Problem List   Diagnosis Date Noted   Pneumonia 10/07/2022   T2DM (type 2 diabetes mellitus) (HCC) 10/07/2022   Parkinsonism (HCC) 08/28/2022   NPH (normal pressure hydrocephalus) (HCC) 08/28/2022   Stage 3a chronic kidney disease (HCC) 07/11/2022   Pain due to  onychomycosis of toenails of both feet 06/05/2022   Dysphonia 02/28/2021   MCI (mild cognitive impairment) 02/28/2021   Coronary artery disease involving native coronary artery of native heart without angina pectoris 10/27/2020   Gait disturbance 08/29/2020   Essential tremor 08/29/2020   Elevated coronary artery calcium  score 08/24/2020   Primary parkinsonism (HCC) 05/04/2020   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   Mixed hyperlipidemia 01/25/2020   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HCC) 01/25/2020   Essential hypertension 11/04/2018   Abdominal aortic aneurysm (AAA) without rupture (HCC) 07/24/2018   Sleep behavior disorder, REM 07/21/2018   Abnormal stress test 05/26/2018   Exertional dyspnea 05/26/2018   Nonischemic cardiomyopathy (HCC) 05/26/2018   Tremor observed on examination 06/25/2017   Uncontrolled REM sleep behavior disorder 03/11/2016   Nightmares REM-sleep type 03/11/2016   Lumbar degenerative disc disease 06/01/2014    PCP: Imelda Man, MD  REFERRING PROVIDER: Syliva Even, MD  REFERRING DIAG: Lumbar spine pain  Rationale for Evaluation and Treatment: Rehabilitation  THERAPY DIAG:  Other low back pain  Muscle weakness (generalized)  ONSET DATE: Chronic, worsening over past 2 months   SUBJECTIVE:      SUBJECTIVE STATEMENT: Patient reports his  back is feeling better and the left leg is feeling looser.   Eval: Patient reports left sided lower back/hip pain that seems to be painful after he has been sitting and then goes to stand up. Initially he is quite uncomfortable, but once he goes to move around then the pain will lessen. He also notes that getting out of the bed is uncomfortable. This has been worsening over the past 2 months. He reports always having a sensation or stiffness in the lower back even at rest. If walks for extended periods then he can start to feel a discomfort in the lower back, but also his  motions start to get worse from the legs getting stiff. He does do fitness focused on strengthening the upper body and legs. He has Parkinson's so he wants to keep strong and work on his balance and walking. He does have a cane he will use but tries not to use it if he doesn't need to.   PERTINENT HISTORY:  See PMH above  PAIN:  Are you having pain? Yes:  NPRS scale: 1/10 currently, 7-8/10 at worst Pain location: Left lower back  Pain description: Stiffness, sharp Aggravating factors: Standing after sitting, getting out of bed Relieving factors: Rest  PRECAUTIONS: None  PATIENT GOALS: Decrease pain when standing from a chair   OBJECTIVE:  Note: Objective measures were completed at Evaluation unless otherwise noted. PATIENT SURVEYS:  PSFS: 5.5 Getting out of chair: 6 Bending and then being able to straighten back up: 5  MUSCLE LENGTH: Limitations in bilateral hamstrings, hip flexor/quad  POSTURE:   Decreased lumbar lordosis, rounded shoulder posture  PALPATION: Tender to palpation at left L5 facet region  Global hypomobility of the lumbar spine, nonconcordant  LUMBAR ROM:   AROM eval  Flexion 50%  Extension 25%  Right lateral flexion 50%  Left lateral flexion 25%  Right rotation 50%  Left rotation 50%   (Blank rows = not tested)  LOWER EXTREMITY ROM:      Hip PROM grossly WFL except slight limitations with hip IR bilaterally  LOWER EXTREMITY MMT:    MMT Right eval Left eval  Hip flexion 4 4  Hip extension 3+ 3  Hip abduction 4- 3+  Hip adduction    Hip internal rotation    Hip external rotation    Knee flexion 5 5  Knee extension 5 5  Ankle dorsiflexion    Ankle plantarflexion    Ankle inversion    Ankle eversion     (Blank rows = not tested)  LUMBAR SPECIAL TESTS:  Lumbar radicular testing negative  FUNCTIONAL TESTS:  30 seconds chair stand test: 12 reps, patient reports left lower back pain  GAIT: Assistive device utilized: None Level of  assistance: Complete Independence Comments: Antalgic on left especially after first getting up from chair   TREATMENT [facility] Adult PT Treatment:                                                DATE: 10/01/2023 Recumbent bike L4 x 5 min to improve endurance and workload capacity LTR 5 x5 sec each Bridge 2 x 10 Side clamshell with green 2 x 15 90-90 hold 2 x 5 x 10 sec Sit to stand 2 x 10 Pallof press with L1 powerband x 10 each Deadlift from 8" box with 15# 2 x 10  PATIENT EDUCATION:  Education details: HEP update Person educated: Patient Education method: Explanation, Demonstration, Tactile cues, Verbal cues, and Handout Education comprehension: verbalized understanding, returned demonstration, verbal cues required, tactile cues required, and needs further education  HOME EXERCISE PROGRAM: Access Code: [email or web address]    ASSESSMENT: CLINICAL IMPRESSION: Patient tolerated therapy well with no adverse effects. Therapy focused primarily on progression or core and hip strengthening with good tolerance. He reports overall improvement in left lower back pain and reduced tightness when standing from a seated position. He was able to progress with added core strengthening and incorporated deadlifts with good tolerance. He did reports some left lower back discomfort toward end of second set of sit to stand exercises. Updated HEP to incorporate stretching for the lumbar region. Patient would benefit from continued skilled PT to progress mobility and strength in order to reduce pain and maximize functional ability.   Eval: Patient is a 78 y.o. male who was seen today for physical therapy evaluation and treatment for chronic left sided lower back and posterior hip pain. The pain primarily occurs when he stands from a seated position and he has difficulty fully extending his hips/lower back, and exhibits antalgic gait for first few steps on the left. He does exhibit gross limitation of his lumbar mobility and  flexibility deficits that are likely impacting his ability to stand upright after sitting extended periods, as well as strength deficits of his core and hip musculature contributing to his symptoms and impacting his functional ability.  OBJECTIVE IMPAIRMENTS: Abnormal gait, decreased activity tolerance, decreased ROM, decreased strength, impaired flexibility, postural dysfunction, and pain.   ACTIVITY LIMITATIONS: bending, sitting, standing, transfers, and locomotion level  PARTICIPATION LIMITATIONS: meal prep, cleaning, driving, and community activity  PERSONAL FACTORS: Fitness, Past/current experiences, and Time since onset of injury/illness/exacerbation are also affecting patient's functional outcome.    GOALS: Goals reviewed with patient? Yes  SHORT TERM GOALS: Target date: 10/15/2023  Patient will be I with initial HEP in order to progress with therapy. Baseline: HEP provided at eval Goal status: INITIAL  2.  Patient will report left lower back pain </= 5/10 when standing from seated position in order to reduce functional limitations Baseline: 7-8/10 Goal status: INITIAL  LONG TERM GOALS: Target date: 11/12/2023  Patient will be I with final HEP to maintain progress from PT. Baseline: HEP provided at eval Goal status: INITIAL  2.  Patient will report PSFS >/= 8 in order to indicate improvement in his functional ability Baseline: 5.5 Goal status: INITIAL  3.  Patient will demonstrate hip strength >/= 4-/5 MMT in order to improve ability to stand and walk without limitation Baseline: see limitations above Goal status: INITIAL  4.  Patient will perform 30 sec stand test >/= 15 reps to indicate improvement in LE strength and mobility to reduce functional limitations Baseline: 12 reps Goal status: INITIAL   PLAN: PT FREQUENCY: 1x/week  PT DURATION: 8 weeks  PLANNED INTERVENTIONS: 97164- PT Re-evaluation, 97110-Therapeutic exercises, 97530- Therapeutic activity, 97112-  Neuromuscular re-education, 97535- Self Care, 47829- Manual therapy, Patient/Family education, Balance training, Stair training, Dry Needling, Joint mobilization, Joint manipulation, Spinal manipulation, Spinal mobilization, Cryotherapy, and Moist heat.  PLAN FOR NEXT SESSION: Review HEP and progress PRN, manual/mobs for lumbar spine and progressing lumbar mobility, hip and LE stretching for flexibility deficits, progress core and hip strengthening   Leah Primus, PT, DPT, LAT, ATC 10/01/23  12:40 PM Phone: [phone] Fax: [phone]

## 2023-10-01 NOTE — Patient Instructions (Signed)
 Access Code: [email or web address] URL: [email or web address] Date: 10/01/2023 Prepared by: Leah Primus  Exercises - Clam with Resistance  - 1 x daily - 3 sets - 15 reps - Prone Hip Extension  - 1 x daily - 3 sets - 10 reps - Supine Bridge  - 1 x daily - 2 sets - 10 reps - 5 seconds hold - Supine Lower Trunk Rotation  - 1 x daily - 10 reps - 5 seconds hold

## 2023-10-02 ENCOUNTER — Ambulatory Visit: Payer: Medicare Other | Admitting: Podiatry

## 2023-10-03 ENCOUNTER — Ambulatory Visit (HOSPITAL_COMMUNITY)

## 2023-10-08 ENCOUNTER — Other Ambulatory Visit: Payer: Self-pay

## 2023-10-08 ENCOUNTER — Encounter: Payer: Self-pay | Admitting: Physical Therapy

## 2023-10-08 ENCOUNTER — Ambulatory Visit (INDEPENDENT_AMBULATORY_CARE_PROVIDER_SITE_OTHER): Admitting: Physical Therapy

## 2023-10-08 DIAGNOSIS — M5459 Other low back pain: Secondary | ICD-10-CM

## 2023-10-08 DIAGNOSIS — M6281 Muscle weakness (generalized): Secondary | ICD-10-CM

## 2023-10-08 NOTE — Patient Instructions (Signed)
 Access Code: [email or web address] URL: [email or web address] Date: 10/08/2023 Prepared by: Leah Primus  Exercises - Clam with Resistance  - 1 x daily - 3 sets - 15 reps - Prone Hip Extension  - 1 x daily - 3 sets - 10 reps - Supine Bridge  - 1 x daily - 2 sets - 10 reps - 5 seconds hold - Supine Lower Trunk Rotation  - 1 x daily - 10 reps - 5 seconds hold - Standing Row with Anchored Resistance  - 1 x daily - 3 sets - 10 reps

## 2023-10-08 NOTE — Therapy (Signed)
 OUTPATIENT PHYSICAL THERAPY TREATMENT   Patient Name: Tyler Hayes MRN: [id number] DOB:[birth year], 78 y.o., male Today's Date: 10/08/2023   END OF SESSION:  PT End of Session - 10/08/23 1152     Visit Number 3    Number of Visits 9    Date for PT Re-Evaluation 11/12/23    Authorization Type MCR    Progress Note Due on Visit 10    PT Start Time 1148    PT Stop Time 1230    PT Time Calculation (min) 42 min    Activity Tolerance Patient tolerated treatment well    Behavior During Therapy WFL for tasks assessed/performed               Past Medical History:  Diagnosis Date   Arthritis    back   Cancer (HCC)    prostate   Diabetes mellitus without complication (HCC)    on meds   Diverticulosis    Hypercholesteremia    Hypertension    Meatal stenosis    Osteoarthritis    Parkinson disease (HCC)    Sinus drainage    Tinea pedis    Past Surgical History:  Procedure Laterality Date   BACK SURGERY  1984, 2013   lumbar   COLONOSCOPY  06/10/2019   Dr. Honey Lusty   DG DILATION URETERS  2003   EYE SURGERY Bilateral 1999   Lasik   LEFT HEART CATH AND CORONARY ANGIOGRAPHY N/A 05/26/2018   Procedure: LEFT HEART CATH AND CORONARY ANGIOGRAPHY;  Surgeon: Cody Das, MD;  Location: [facility];  Service: Cardiovascular;  Laterality: N/A;   POSTERIOR LUMBAR FUSION 4 LEVEL N/A 06/01/2014   Procedure: LUMBAR THREE TO FOUR, LUMBAR FOUR TO FIVE LAMINECTOMY,  RIGHT LUMBAR FUSION AT LUMBAR FOUR TO FIVE;  Surgeon: Adelbert Adler, MD;  Location: [facility];  Service: Neurosurgery;  Laterality: N/A;  POSSIBLE L2-3 L3-4 L4-5 L5-S1 POSTERIOR LUMBAR INTERBODY FUSION   PROSTATECTOMY  2002   Patient Active Problem List   Diagnosis Date Noted   Pneumonia 10/07/2022   T2DM (type 2 diabetes mellitus) (HCC) 10/07/2022   Parkinsonism (HCC) 08/28/2022   NPH (normal pressure hydrocephalus) (HCC) 08/28/2022   Stage 3a chronic kidney disease (HCC) 07/11/2022   Pain due  to onychomycosis of toenails of both feet 06/05/2022   Dysphonia 02/28/2021   MCI (mild cognitive impairment) 02/28/2021   Coronary artery disease involving native coronary artery of native heart without angina pectoris 10/27/2020   Gait disturbance 08/29/2020   Essential tremor 08/29/2020   Elevated coronary artery calcium  score 08/24/2020   Primary parkinsonism (HCC) 05/04/2020   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   Mixed hyperlipidemia 01/25/2020   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HCC) 01/25/2020   Essential hypertension 11/04/2018   Abdominal aortic aneurysm (AAA) without rupture (HCC) 07/24/2018   Sleep behavior disorder, REM 07/21/2018   Abnormal stress test 05/26/2018   Exertional dyspnea 05/26/2018   Nonischemic cardiomyopathy (HCC) 05/26/2018   Tremor observed on examination 06/25/2017   Uncontrolled REM sleep behavior disorder 03/11/2016   Nightmares REM-sleep type 03/11/2016   Lumbar degenerative disc disease 06/01/2014    PCP: Imelda Man, MD  REFERRING PROVIDER: Syliva Even, MD  REFERRING DIAG: Lumbar spine pain  Rationale for Evaluation and Treatment: Rehabilitation  THERAPY DIAG:  Other low back pain  Muscle weakness (generalized)  ONSET DATE: Chronic, worsening over past 2 months   SUBJECTIVE:      SUBJECTIVE STATEMENT: Patient reports  he has been traveling so hasn't done much of the exercises. With all the sitting he was feeling a little stiff but the pain he was feeling in the left lower back has resolved.   Eval: Patient reports left sided lower back/hip pain that seems to be painful after he has been sitting and then goes to stand up. Initially he is quite uncomfortable, but once he goes to move around then the pain will lessen. He also notes that getting out of the bed is uncomfortable. This has been worsening over the past 2 months. He reports always having a sensation or stiffness in the lower back even  at rest. If walks for extended periods then he can start to feel a discomfort in the lower back, but also his motions start to get worse from the legs getting stiff. He does do fitness focused on strengthening the upper body and legs. He has Parkinson's so he wants to keep strong and work on his balance and walking. He does have a cane he will use but tries not to use it if he doesn't need to.   PERTINENT HISTORY:  See PMH above  PAIN:  Are you having pain? Yes:  NPRS scale: 1/10 currently, 7-8/10 at worst Pain location: Left lower back  Pain description: Stiffness Aggravating factors: Standing after sitting, getting out of bed Relieving factors: Rest  PRECAUTIONS: None  PATIENT GOALS: Decrease pain when standing from a chair   OBJECTIVE:  Note: Objective measures were completed at Evaluation unless otherwise noted. PATIENT SURVEYS:  PSFS: 5.5 Getting out of chair: 6 Bending and then being able to straighten back up: 5  MUSCLE LENGTH: Limitations in bilateral hamstrings, hip flexor/quad  POSTURE:   Decreased lumbar lordosis, rounded shoulder posture  PALPATION: Tender to palpation at left L5 facet region  Global hypomobility of the lumbar spine, nonconcordant  LUMBAR ROM:   AROM eval  Flexion 50%  Extension 25%  Right lateral flexion 50%  Left lateral flexion 25%  Right rotation 50%  Left rotation 50%   (Blank rows = not tested)  LOWER EXTREMITY ROM:      Hip PROM grossly WFL except slight limitations with hip IR bilaterally  LOWER EXTREMITY MMT:    MMT Right eval Left eval  Hip flexion 4 4  Hip extension 3+ 3  Hip abduction 4- 3+  Hip adduction    Hip internal rotation    Hip external rotation    Knee flexion 5 5  Knee extension 5 5  Ankle dorsiflexion    Ankle plantarflexion    Ankle inversion    Ankle eversion     (Blank rows = not tested)  LUMBAR SPECIAL TESTS:  Lumbar radicular testing negative  FUNCTIONAL TESTS:  30 seconds chair stand  test: 12 reps, patient reports left lower back pain  GAIT: Assistive device utilized: None Level of assistance: Complete Independence Comments: Antalgic on left especially after first getting up from chair   TREATMENT [facility] Adult PT Treatment:                                                DATE: 10/01/2023 Recumbent bike L4 x 5 min to improve endurance and workload capacity LTR 5 x5 sec each Bridge 2 x 10 SLR 2 x 10 each Side clamshell with blue 2 x 15 each Sit to stand  2 x 10 Row with blue 2 x 15 Deadlift from 8" box with 20# 2 x 10  PATIENT EDUCATION:  Education details: HEP update Person educated: Patient Education method: Explanation, Demonstration, Tactile cues, Verbal cues, and Handout Education comprehension: verbalized understanding, returned demonstration, verbal cues required, tactile cues required, and needs further education  HOME EXERCISE PROGRAM: Access Code: [email or web address]    ASSESSMENT: CLINICAL IMPRESSION: Patient tolerated therapy well with no adverse effects. Therapy continued to focus on progressing his hip, core, and incorporated postural strengthening with good tolerance. He continues to report left lower back pain with sit to stand has improved but does not some tightness around the area. He was able to progress weight with lifting this visit and does require occasional cueing for proper exercise technique and posture. Updated HEP to incorporate postural strengthening for home. Patient would benefit from continued skilled PT to progress mobility and strength in order to reduce pain and maximize functional ability.   Eval: Patient is a 78 y.o. male who was seen today for physical therapy evaluation and treatment for chronic left sided lower back and posterior hip pain. The pain primarily occurs when he stands from a seated position and he has difficulty fully extending his hips/lower back, and exhibits antalgic gait for first few steps on the left. He does exhibit  gross limitation of his lumbar mobility and flexibility deficits that are likely impacting his ability to stand upright after sitting extended periods, as well as strength deficits of his core and hip musculature contributing to his symptoms and impacting his functional ability.  OBJECTIVE IMPAIRMENTS: Abnormal gait, decreased activity tolerance, decreased ROM, decreased strength, impaired flexibility, postural dysfunction, and pain.   ACTIVITY LIMITATIONS: bending, sitting, standing, transfers, and locomotion level  PARTICIPATION LIMITATIONS: meal prep, cleaning, driving, and community activity  PERSONAL FACTORS: Fitness, Past/current experiences, and Time since onset of injury/illness/exacerbation are also affecting patient's functional outcome.    GOALS: Goals reviewed with patient? Yes  SHORT TERM GOALS: Target date: 10/15/2023  Patient will be I with initial HEP in order to progress with therapy. Baseline: HEP provided at eval Goal status: INITIAL  2.  Patient will report left lower back pain </= 5/10 when standing from seated position in order to reduce functional limitations Baseline: 7-8/10 Goal status: INITIAL  LONG TERM GOALS: Target date: 11/12/2023  Patient will be I with final HEP to maintain progress from PT. Baseline: HEP provided at eval Goal status: INITIAL  2.  Patient will report PSFS >/= 8 in order to indicate improvement in his functional ability Baseline: 5.5 Goal status: INITIAL  3.  Patient will demonstrate hip strength >/= 4-/5 MMT in order to improve ability to stand and walk without limitation Baseline: see limitations above Goal status: INITIAL  4.  Patient will perform 30 sec stand test >/= 15 reps to indicate improvement in LE strength and mobility to reduce functional limitations Baseline: 12 reps Goal status: INITIAL   PLAN: PT FREQUENCY: 1x/week  PT DURATION: 8 weeks  PLANNED INTERVENTIONS: 97164- PT Re-evaluation, 97110-Therapeutic  exercises, 97530- Therapeutic activity, 97112- Neuromuscular re-education, 97535- Self Care, 47829- Manual therapy, Patient/Family education, Balance training, Stair training, Dry Needling, Joint mobilization, Joint manipulation, Spinal manipulation, Spinal mobilization, Cryotherapy, and Moist heat.  PLAN FOR NEXT SESSION: Review HEP and progress PRN, manual/mobs for lumbar spine and progressing lumbar mobility, hip and LE stretching for flexibility deficits, progress core and hip strengthening   Leah Primus, PT, DPT, LAT, ATC 10/08/23  12:39 PM Phone: [phone]  Fax: [phone]

## 2023-10-10 ENCOUNTER — Ambulatory Visit (INDEPENDENT_AMBULATORY_CARE_PROVIDER_SITE_OTHER): Admitting: Family Medicine

## 2023-10-10 VITALS — BP 118/76 | HR 80 | Ht 70.0 in | Wt 175.0 lb

## 2023-10-10 DIAGNOSIS — M545 Low back pain, unspecified: Secondary | ICD-10-CM

## 2023-10-10 NOTE — Patient Instructions (Addendum)
 Thank you for coming in today.   Keep working on those home exercises.   Recheck as needed.

## 2023-10-10 NOTE — Progress Notes (Signed)
   Joanna Muck, PhD, LAT, ATC acting as a scribe for Garlan Juniper, MD.  Tyler Hayes is a 78 y.o. male who presents to [facility] today for f/u LBP. Pt was last seen by Dr. Alease Hunter on 08/29/23 and was advised to use a heating pad, and referred to PT, completing 3 visits.  Today, pt reports LBP is feeling much better. Pain has resolved w/ transitioning to stand and sit. He notes adjusting the types of chairs he sits in, to make sure his back has enough support.   Dx imaging: 08/29/23 L-spine XR 10/07/22 L-spine MRI             05/16/21 L-spine & L hip XR  Pertinent review of systems: No fevers or chills  Relevant historical information: Cardiomyopathy.  Normal pressure hydrocephalus.  Diabetes.  Parkinson's.   Exam:  BP 118/76   Pulse 80   Ht 5\' 10"  (1.778 m)   Wt 175 lb (79.4 kg)   SpO2 96%   BMI 25.11 kg/m  General: Well Developed, well nourished, and in no acute distress.   MSK: L-spine normal appearing nontender palpation normal lumbar motion.    Lab and Radiology Results  DG Lumbar Spine 2-3 Views Result Date: 08/29/2023 CLINICAL DATA:  Low back pain for 2 months. EXAM: LUMBAR SPINE - 2-3 VIEW COMPARISON:  May 16, 2021 FINDINGS: There is no evidence of lumbar spine fracture. Status post prior posterior fusion of L4 and L5 without malalignment. Moderate degenerative joint changes with narrowed joint spaces, facet joint sclerosis and anterior spurring are noted throughout lumbar spine. Scoliosis. IMPRESSION: Status post prior posterior fusion of L4 and L5 without malalignment. Moderate degenerative joint changes of lumbar spine. Electronically Signed   By: Anna Barnes M.D.   On: 08/29/2023 15:37   I, Garlan Juniper, personally (independently) visualized and performed the interpretation of the images attached in this note.    Assessment and Plan: 78 y.o. male with chronic low back pain improving with physical therapy.  Plan to  finish out physical therapy and continue home exercise program.  Check back as needed. Happy to reauthorize physical therapy if needed in the future.  PDMP not reviewed this encounter. No orders of the defined types were placed in this encounter.  No orders of the defined types were placed in this encounter.    Discussed warning signs or symptoms. Please see discharge instructions. Patient expresses understanding.   The above documentation has been reviewed and is accurate and complete Garlan Juniper, M.D.

## 2023-10-15 ENCOUNTER — Other Ambulatory Visit: Payer: Self-pay

## 2023-10-15 ENCOUNTER — Ambulatory Visit (INDEPENDENT_AMBULATORY_CARE_PROVIDER_SITE_OTHER): Admitting: Physical Therapy

## 2023-10-15 ENCOUNTER — Encounter: Payer: Self-pay | Admitting: Physical Therapy

## 2023-10-15 DIAGNOSIS — M6281 Muscle weakness (generalized): Secondary | ICD-10-CM | POA: Diagnosis not present

## 2023-10-15 DIAGNOSIS — M5459 Other low back pain: Secondary | ICD-10-CM | POA: Diagnosis not present

## 2023-10-15 NOTE — Therapy (Signed)
 OUTPATIENT PHYSICAL THERAPY TREATMENT  DISCHARGE   Patient Name: Tyler Hayes MRN: [id number] DOB:Sep 03, 1945, 78 y.o., male Today's Date: 10/15/2023   END OF SESSION:  PT End of Session - 10/15/23 1158     Visit Number 4    Number of Visits 9    Date for PT Re-Evaluation 11/12/23    Authorization Type MCR    Progress Note Due on Visit 10    PT Start Time 1148    PT Stop Time 1228    PT Time Calculation (min) 40 min    Activity Tolerance Patient tolerated treatment well    Behavior During Therapy [facility] for tasks assessed/performed                Past Medical History:  Diagnosis Date   Arthritis    back   Cancer (HCC)    prostate   Diabetes mellitus without complication (HCC)    on meds   Diverticulosis    Hypercholesteremia    Hypertension    Meatal stenosis    Osteoarthritis    Parkinson disease (HCC)    Sinus drainage    Tinea pedis    Past Surgical History:  Procedure Laterality Date   BACK SURGERY  1984, 2013   lumbar   COLONOSCOPY  06/10/2019   Dr. Honey Lusty   DG DILATION URETERS  2003   EYE SURGERY Bilateral 1999   Lasik   LEFT HEART CATH AND CORONARY ANGIOGRAPHY N/A 05/26/2018   Procedure: LEFT HEART CATH AND CORONARY ANGIOGRAPHY;  Surgeon: Cody Das, MD;  Location: [facility] INVASIVE CV [facility];  Service: Cardiovascular;  Laterality: N/A;   POSTERIOR LUMBAR FUSION 4 LEVEL N/A 06/01/2014   Procedure: LUMBAR THREE TO FOUR, LUMBAR FOUR TO FIVE LAMINECTOMY,  RIGHT LUMBAR FUSION AT LUMBAR FOUR TO FIVE;  Surgeon: Adelbert Adler, MD;  Location: [facility] NEURO ORS;  Service: Neurosurgery;  Laterality: N/A;  POSSIBLE L2-3 L3-4 L4-5 L5-S1 POSTERIOR LUMBAR INTERBODY FUSION   PROSTATECTOMY  2002   Patient Active Problem List   Diagnosis Date Noted   Pneumonia 10/07/2022   T2DM (type 2 diabetes mellitus) (HCC) 10/07/2022   Parkinsonism (HCC) 08/28/2022   NPH (normal pressure hydrocephalus) (HCC) 08/28/2022   Stage 3a chronic kidney disease (HCC)  07/11/2022   Pain due to onychomycosis of toenails of both feet 06/05/2022   Dysphonia 02/28/2021   MCI (mild cognitive impairment) 02/28/2021   Coronary artery disease involving native coronary artery of native heart without angina pectoris 10/27/2020   Gait disturbance 08/29/2020   Essential tremor 08/29/2020   Elevated coronary artery calcium  score 08/24/2020   Primary parkinsonism (HCC) 05/04/2020   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   Mixed hyperlipidemia 01/25/2020   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HCC) 01/25/2020   Essential hypertension 11/04/2018   Abdominal aortic aneurysm (AAA) without rupture (HCC) 07/24/2018   Sleep behavior disorder, REM 07/21/2018   Abnormal stress test 05/26/2018   Exertional dyspnea 05/26/2018   Nonischemic cardiomyopathy (HCC) 05/26/2018   Tremor observed on examination 06/25/2017   Uncontrolled REM sleep behavior disorder 03/11/2016   Nightmares REM-sleep type 03/11/2016   Lumbar degenerative disc disease 06/01/2014    PCP: Imelda Man, MD  REFERRING PROVIDER: Syliva Even, MD  REFERRING DIAG: Lumbar spine pain  Rationale for Evaluation and Treatment: Rehabilitation  THERAPY DIAG:  Other low back pain  Muscle weakness (generalized)  ONSET DATE: Chronic, worsening over past 2 months   SUBJECTIVE:      SUBJECTIVE  STATEMENT: Patient reports he saw the doctor and feels like he is ready for this to be his last visit.   Eval: Patient reports left sided lower back/hip pain that seems to be painful after he has been sitting and then goes to stand up. Initially he is quite uncomfortable, but once he goes to move around then the pain will lessen. He also notes that getting out of the bed is uncomfortable. This has been worsening over the past 2 months. He reports always having a sensation or stiffness in the lower back even at rest. If walks for extended periods then he can start to feel a discomfort  in the lower back, but also his motions start to get worse from the legs getting stiff. He does do fitness focused on strengthening the upper body and legs. He has Parkinson's so he wants to keep strong and work on his balance and walking. He does have a cane he will use but tries not to use it if he doesn't need to.   PERTINENT HISTORY:  See PMH above  PAIN:  Are you having pain? Yes:  NPRS scale: 0/10 currently, 3/10 at worst Pain location: Left lower back  Pain description: Stiffness Aggravating factors: Standing after sitting, getting out of bed Relieving factors: Rest  PRECAUTIONS: None  PATIENT GOALS: Decrease pain when standing from a chair   OBJECTIVE:  Note: Objective measures were completed at Evaluation unless otherwise noted. PATIENT SURVEYS:  PSFS: 5.5 Getting out of chair: 6 Bending and then being able to straighten back up: 5  10/15/2023: PSFS: 9 Getting out of chair: 9 Bending and then being able to straighten back up: 9  MUSCLE LENGTH: Limitations in bilateral hamstrings, hip flexor/quad  POSTURE:   Decreased lumbar lordosis, rounded shoulder posture  PALPATION: Tender to palpation at left L5 facet region  Global hypomobility of the lumbar spine, nonconcordant  LUMBAR ROM:   AROM eval  Flexion 50%  Extension 25%  Right lateral flexion 50%  Left lateral flexion 25%  Right rotation 50%  Left rotation 50%   (Blank rows = not tested)  LOWER EXTREMITY ROM:      Hip PROM grossly WFL except slight limitations with hip IR bilaterally  LOWER EXTREMITY MMT:    MMT Right eval Left eval Rt / Lt 10/15/2023  Hip flexion 4 4 4  / 4  Hip extension 3+ 3 3+ / 3+  Hip abduction 4- 3+ 4- / 3+  Hip adduction     Hip internal rotation     Hip external rotation     Knee flexion 5 5   Knee extension 5 5   Ankle dorsiflexion     Ankle plantarflexion     Ankle inversion     Ankle eversion      (Blank rows = not tested)  LUMBAR SPECIAL TESTS:  Lumbar  radicular testing negative  FUNCTIONAL TESTS:  30 seconds chair stand test: 12 reps, patient reports left lower back pain  10/15/2023: 14 reps, no pain  GAIT: Assistive device utilized: None Level of assistance: Complete Independence Comments: Antalgic on left especially after first getting up from chair   TREATMENT [facility] Adult PT Treatment:                                                DATE: 10/15/2023 Recumbent bike L5 x  5 min to improve endurance and workload capacity LTR 5 x5 sec each Bridge 2 x 10 Side clamshell with blue 2 x 15 each Sit to stand holding 15# at chest 2 x 10 Deadlift from 6" box with 25# 2 x 10 Pallof press with L2 powerband x 10 each Row with blue 2 x 15  PATIENT EDUCATION:  Education details: POC discharge, HEP Person educated: Patient Education method: Explanation, Demonstration, Tactile cues, Verbal cues, and Handout Education comprehension: verbalized understanding, returned demonstration, verbal cues required, tactile cues required, and needs further education  HOME EXERCISE PROGRAM: Access Code: [email or web address]    ASSESSMENT: CLINICAL IMPRESSION: Patient tolerated therapy well with no adverse effects. He has made great progress in therapy, demonstrating improvement in his functional status, pain level with activity, strength, and mobility. He does continued to exhibit some deficits with his strength but he is independent with his HEP and is pleased with his current functional status so will be formally discharged from PT at this time.   Eval: Patient is a 78 y.o. male who was seen today for physical therapy evaluation and treatment for chronic left sided lower back and posterior hip pain. The pain primarily occurs when he stands from a seated position and he has difficulty fully extending his hips/lower back, and exhibits antalgic gait for first few steps on the left. He does exhibit gross limitation of his lumbar mobility and flexibility deficits that are  likely impacting his ability to stand upright after sitting extended periods, as well as strength deficits of his core and hip musculature contributing to his symptoms and impacting his functional ability.  OBJECTIVE IMPAIRMENTS: Abnormal gait, decreased activity tolerance, decreased ROM, decreased strength, impaired flexibility, postural dysfunction, and pain.   ACTIVITY LIMITATIONS: bending, sitting, standing, transfers, and locomotion level  PARTICIPATION LIMITATIONS: meal prep, cleaning, driving, and community activity  PERSONAL FACTORS: Fitness, Past/current experiences, and Time since onset of injury/illness/exacerbation are also affecting patient's functional outcome.    GOALS: Goals reviewed with patient? Yes  SHORT TERM GOALS: Target date: 10/15/2023  Patient will be I with initial HEP in order to progress with therapy. Baseline: HEP provided at eval 10/15/2023: independent Goal status: MET  2.  Patient will report left lower back pain </= 5/10 when standing from seated position in order to reduce functional limitations Baseline: 7-8/10 10/15/2023: patient denies lower back pain when standing Goal status: MET  LONG TERM GOALS: Target date: 11/12/2023  Patient will be I with final HEP to maintain progress from PT. Baseline: HEP provided at eval 10/15/2023: independent Goal status: MET  2.  Patient will report PSFS >/= 8 in order to indicate improvement in his functional ability Baseline: 5.5 10/15/2023: 9 Goal status: MET  3.  Patient will demonstrate hip strength >/= 4-/5 MMT in order to improve ability to stand and walk without limitation Baseline: see limitations above 10/15/2023; see above Goal status: PARTIALLY MET  4.  Patient will perform 30 sec stand test >/= 15 reps to indicate improvement in LE strength and mobility to reduce functional limitations Baseline: 12 reps 10/15/2023: 14 reps, no pain Goal status: PARTIALLY MET   PLAN: PT FREQUENCY: 1x/week  PT  DURATION: 8 weeks  PLANNED INTERVENTIONS: 97164- PT Re-evaluation, 97110-Therapeutic exercises, 97530- Therapeutic activity, 97112- Neuromuscular re-education, 97535- Self Care, 54098- Manual therapy, Patient/Family education, Balance training, Stair training, Dry Needling, Joint mobilization, Joint manipulation, Spinal manipulation, Spinal mobilization, Cryotherapy, and Moist heat.  PLAN FOR NEXT SESSION: NA - discharge   Leah Primus,  PT, DPT, LAT, ATC 10/15/23  12:38 PM Phone: [phone] Fax: [phone]   PHYSICAL THERAPY DISCHARGE SUMMARY  Visits from Start of Care: 4  Current functional level related to goals / functional outcomes: See above   Remaining deficits: See above   Education / Equipment: HEP   Patient agrees to discharge. Patient goals were partially met. Patient is being discharged due to being pleased with the current functional level.

## 2023-10-16 ENCOUNTER — Ambulatory Visit (HOSPITAL_COMMUNITY)
Admission: RE | Admit: 2023-10-16 | Discharge: 2023-10-16 | Disposition: A | Discharge status: Home / Self Care | Source: Ambulatory Visit | Attending: Cardiology | Admitting: Cardiology

## 2023-10-16 DIAGNOSIS — I10 Essential (primary) hypertension: Secondary | ICD-10-CM

## 2023-10-16 LAB — ECHOCARDIOGRAM COMPLETE
Area-P 1/2: 2.36 cm2
P 1/2 time: 591 ms
S' Lateral: 3.6 cm

## 2023-10-17 ENCOUNTER — Ambulatory Visit: Payer: Self-pay | Admitting: Cardiology

## 2023-10-17 DIAGNOSIS — M0609 Rheumatoid arthritis without rheumatoid factor, multiple sites: Secondary | ICD-10-CM | POA: Diagnosis not present

## 2023-10-22 ENCOUNTER — Encounter: Admitting: Physical Therapy

## 2023-10-23 ENCOUNTER — Other Ambulatory Visit: Payer: Self-pay | Admitting: Cardiology

## 2023-10-23 ENCOUNTER — Other Ambulatory Visit: Payer: Self-pay | Admitting: Neurology

## 2023-10-24 ENCOUNTER — Encounter: Payer: Self-pay | Admitting: Neurology

## 2023-10-27 ENCOUNTER — Other Ambulatory Visit: Payer: Self-pay

## 2023-10-27 MED ORDER — CARBIDOPA-LEVODOPA 25-100 MG PO TABS: 1.0000 | ORAL_TABLET | Freq: Three times a day (TID) | ORAL | 5 refills | Status: DC

## 2023-10-27 NOTE — Telephone Encounter (Signed)
 Pt has called to f/u on his request re: the refill request for carbidopa -levodopa  (SINEMET  IR) 25-100 MG tablet to [facility] [phone]

## 2023-10-28 DIAGNOSIS — I10 Essential (primary) hypertension: Secondary | ICD-10-CM | POA: Diagnosis not present

## 2023-11-03 ENCOUNTER — Other Ambulatory Visit (HOSPITAL_COMMUNITY)

## 2023-11-14 DIAGNOSIS — M0609 Rheumatoid arthritis without rheumatoid factor, multiple sites: Secondary | ICD-10-CM | POA: Diagnosis not present

## 2023-12-10 ENCOUNTER — Ambulatory Visit: Admitting: Pulmonary Disease

## 2023-12-10 ENCOUNTER — Encounter: Payer: Self-pay | Admitting: Pulmonary Disease

## 2023-12-10 VITALS — BP 125/73 | HR 64 | Temp 97.4°F | Ht 70.0 in | Wt 178.0 lb

## 2023-12-10 DIAGNOSIS — J4520 Mild intermittent asthma, uncomplicated: Secondary | ICD-10-CM | POA: Diagnosis not present

## 2023-12-10 DIAGNOSIS — R911 Solitary pulmonary nodule: Secondary | ICD-10-CM

## 2023-12-10 NOTE — Progress Notes (Deleted)
 @Patient  ID: Tyler Hayes, male    DOB: 1946-01-28, 78 y.o.   MRN: [id number]  No chief complaint on file.   Referring provider: Clarice Nottingham, MD  HPI:   PMH:  Smoker/ Smoking History:  Maintenance:   Pt of:   12/10/2023  - Visit     Questionaires / Pulmonary Flowsheets:   ACT:      No data to display          MMRC:     No data to display          Epworth:      No data to display          Tests:   FENO:  No results found for: NITRICOXIDE  PFT:     No data to display          WALK:      No data to display          Imaging: No results found.  Lab Results:  CBC    Component Value Date/Time   WBC 5.4 10/08/2022 0741   RBC 3.96 (L) 10/08/2022 0741   HGB 11.5 (L) 10/08/2022 0741   HGB 13.5 07/11/2022 1023   HCT 36.5 (L) 10/08/2022 0741   HCT 41.5 07/11/2022 1023   PLT 205 10/08/2022 0741   PLT 199 07/11/2022 1023   MCV 92.2 10/08/2022 0741   MCV 91 07/11/2022 1023   MCH 29.0 10/08/2022 0741   MCHC 31.5 10/08/2022 0741   RDW 12.5 10/08/2022 0741   RDW 11.9 07/11/2022 1023   LYMPHSABS 0.5 (L) 10/07/2022 0120   LYMPHSABS 0.7 07/11/2022 1023   MONOABS 1.2 (H) 10/07/2022 0120   EOSABS 0.0 10/07/2022 0120   EOSABS 0.1 07/11/2022 1023   BASOSABS 0.0 10/07/2022 0120   BASOSABS 0.0 07/11/2022 1023    BMET    Component Value Date/Time   NA 137 10/08/2022 0741   NA 143 07/11/2022 1023   K 3.9 10/08/2022 0741   CL 106 10/08/2022 0741   CO2 22 10/08/2022 0741   GLUCOSE 93 10/08/2022 0741   BUN 9 10/08/2022 0741   BUN 10 07/11/2022 1023   CREATININE 1.01 10/08/2022 0741   CALCIUM  8.9 10/08/2022 0741   GFRNONAA >60 10/08/2022 0741   GFRAA 72 (L) 05/24/2014 1221    BNP No results found for: BNP  ProBNP No results found for: PROBNP  Specialty Problems       Pulmonary Problems   Exertional dyspnea   Pneumonia    Allergies  Allergen Reactions   Other Anaphylaxis   Ace Inhibitors Cough     LISINOPRIL   Antihistamines, Diphenhydramine -Type Other (See Comments)    Other reaction(s): Unknown    Immunization History  Administered Date(s) Administered   Influenza, High Dose Seasonal PF 02/28/2016   Influenza, Quadrivalent, Recombinant, Inj, Pf 03/05/2017, 02/12/2018, 01/28/2019, 03/07/2020, 02/01/2021   Influenza-Unspecified 02/17/2018   PFIZER(Purple Top)SARS-COV-2 Vaccination 06/17/2019, 07/01/2019, 01/18/2020, 08/30/2020, 02/12/2021   Pneumococcal Conjugate-13 01/06/2014   Pneumococcal Polysaccharide-23 08/04/2003, 12/30/2008, 03/05/2016   Tdap 01/28/2008, 11/27/2020   Zoster Recombinant(Shingrix) 03/18/2018, 06/02/2018   Zoster, Live 03/01/2008    Past Medical History:  Diagnosis Date   Arthritis    back   Cancer (HCC)    prostate   Diabetes mellitus without complication (HCC)    on meds   Diverticulosis    Hypercholesteremia    Hypertension    Meatal stenosis    Osteoarthritis    Parkinson disease (HCC)    Sinus  drainage    Tinea pedis     Tobacco History: Social History   Tobacco Use  Smoking Status Former   Current packs/day: 0.00   Average packs/day: 1.5 packs/day for 20.0 years (30.0 ttl pk-yrs)   Types: Cigarettes   Start date: 02/01/1971   Quit date: 02/01/1991   Years since quitting: 32.8  Smokeless Tobacco Never   Counseling given: Not Answered   Continue to not smoke  Outpatient Encounter Medications as of 12/10/2023  Medication Sig   Abatacept  (ORENCIA  IV) Inject into the vein. Gets every 4 weeks for awhile and then goes to monthly infusions   albuterol (VENTOLIN HFA) 108 (90 Base) MCG/ACT inhaler Inhale 1-2 puffs into the lungs every 6 (six) hours as needed for wheezing or shortness of breath.   amLODipine  (NORVASC ) 2.5 MG tablet Take 2.5 mg by mouth daily.   aspirin  81 MG chewable tablet Chew 81 mg by mouth daily.   atorvastatin  (LIPITOR) 80 MG tablet TAKE 1 TABLET BY MOUTH EVERY DAY   carbidopa -levodopa  (SINEMET  IR) 25-100 MG  tablet Take 1 tablet by mouth 3 (three) times daily. TAKE 1 TABLET 3 TIMES A DAYWITH WATER  30 MINUTES      BEFORE MEALS   carvedilol  (COREG ) 6.25 MG tablet TAKE 1 TABLET BY MOUTH TWICE A DAY   Cholecalciferol  50 MCG (2000 UT) TABS Take 2,000 Units by mouth daily.   clonazePAM  (KLONOPIN ) 0.5 MG tablet At bedtime 1 tab and if needed add a half tab later in the night. REM BD   fluticasone  (FLONASE ) 50 MCG/ACT nasal spray Place 1 spray into both nostrils daily as needed for rhinitis (drainage issues.).    KLOR-CON  M10 10 MEQ tablet Take 10 mEq by mouth daily.   leflunomide (ARAVA) 20 MG tablet Take 20 mg by mouth daily.   losartan  (COZAAR ) 100 MG tablet Take 100 mg by mouth at bedtime.   memantine  (NAMENDA ) 10 MG tablet Take 1 tablet (10 mg total) by mouth 2 (two) times daily.   montelukast (SINGULAIR) 10 MG tablet Take 10 mg by mouth every evening.   SYNJARDY XR 09-998 MG TB24 Take 1 tablet by mouth daily.   No facility-administered encounter medications on file as of 12/10/2023.     Review of Systems  Review of Systems   Physical Exam  There were no vitals taken for this visit.  Wt Readings from Last 5 Encounters:  10/10/23 175 lb (79.4 kg)  08/29/23 181 lb (82.1 kg)  08/27/23 179 lb 9.6 oz (81.5 kg)  06/11/23 183 lb (83 kg)  03/05/23 174 lb (78.9 kg)    BMI Readings from Last 5 Encounters:  10/10/23 25.11 kg/m  08/29/23 25.97 kg/m  08/27/23 25.77 kg/m  06/11/23 26.26 kg/m  03/05/23 24.61 kg/m     Physical Exam    Assessment & Plan:   No problem-specific Assessment & Plan notes found for this encounter.    No follow-ups on file.   Donnice JONELLE Beals, MD 12/10/2023   This appointment required *** minutes of patient care (this includes precharting, chart review, review of results, face-to-face care, etc.).

## 2023-12-10 NOTE — Progress Notes (Signed)
 Synopsis: Referred in April 2023 for abnormal CT chest, groundglass lung nodule by Clarice Nottingham, MD  Subjective:   PATIENT ID: Tyler Hayes GENDER: male DOB: 12-25-1945, MRN: [id number]  Chief Complaint  Patient presents with   Consult    This is a 78 y.o. gentleman, past medical history of Parkinson's disease, rheumatoid arthritis on leflunomide, history of prostate cancer, hypertension.  Patient is seen today for an abnormal CT scan of the chest 2023,  CT scan revealed a 1.8 groundglass subsolid nodular area.  He also has other smaller right lower lobe lung nodules.  He does admit to having occasional choking and or strangulation events while eating.  Serial CT scans have demonstrated stability and subsequent scarring.  Most recent pulmonary note x 2 reviewed.  Concerns for follow-up.  Since that PCP was not satisfied with prior office note of no need to follow the nodule further.  This is significantly decreased in size.  11/2022 compared to 11/2021.  Given decrease in size I think repeat scan is probably unnecessary.  Per Fleischner criteria could justify getting yearly CT scans for 5 years.  Discussed my counsel with patient and partner in room.  They prefer to do yearly CT scans.  As well-controlled.  Rare albuterol use.  Maintenance inhaler use.  Using symptoms bit worse in the colder months, winter.    Past Medical History:  Diagnosis Date   Arthritis    back   Cancer (HCC)    prostate   Diabetes mellitus without complication (HCC)    on meds   Diverticulosis    Hypercholesteremia    Hypertension    Meatal stenosis    Osteoarthritis    Parkinson disease (HCC)    Sinus drainage    Tinea pedis      Family History  Problem Relation Age of Onset   Heart failure Mother    CAD Mother    Cancer Father    Parkinsonism Father    CAD Sister        Brain tumor   Stroke Sister    Stroke Sister    Coronary artery disease Sister    Hypertension Son    Diabetes  Son    Obesity Son    Sleep apnea Son    Heart murmur Son        No major structural issues noted.   Alzheimer's disease Neg Hx    Dementia Neg Hx      Past Surgical History:  Procedure Laterality Date   BACK SURGERY  1984, 2013   lumbar   COLONOSCOPY  06/10/2019   Dr. Dianna   DG DILATION URETERS  2003   EYE SURGERY Bilateral 1999   Lasik   LEFT HEART CATH AND CORONARY ANGIOGRAPHY N/A 05/26/2018   Procedure: LEFT HEART CATH AND CORONARY ANGIOGRAPHY;  Surgeon: Elmira Newman PARAS, MD;  Location: [facility];  Service: Cardiovascular;  Laterality: N/A;   POSTERIOR LUMBAR FUSION 4 LEVEL N/A 06/01/2014   Procedure: LUMBAR THREE TO FOUR, LUMBAR FOUR TO FIVE LAMINECTOMY,  RIGHT LUMBAR FUSION AT LUMBAR FOUR TO FIVE;  Surgeon: Catalina CHRISTELLA Stains, MD;  Location: [facility];  Service: Neurosurgery;  Laterality: N/A;  POSSIBLE L2-3 L3-4 L4-5 L5-S1 POSTERIOR LUMBAR INTERBODY FUSION   PROSTATECTOMY  2002    Social History   Socioeconomic History   Marital status: Married    Spouse name: Not on file   Number of children: 3   Years of education: Not on  file   Highest education level: Not on file  Occupational History   Not on file  Tobacco Use   Smoking status: Former    Current packs/day: 0.00    Average packs/day: 1.5 packs/day for 20.0 years (30.0 ttl pk-yrs)    Types: Cigarettes    Start date: 02/01/1971    Quit date: 02/01/1991    Years since quitting: 32.8   Smokeless tobacco: Never  Vaping Use   Vaping status: Never Used  Substance and Sexual Activity   Alcohol use: Yes    Comment: occasionally   Drug use: No   Sexual activity: Not on file  Other Topics Concern   Not on file  Social History Narrative   Pt lives with  wife    Pt retired    Chief Executive Officer Drivers of Corporate investment banker Strain: Low Risk  (01/27/2020)   Received from [employer]   Overall Financial Resource Strain (CARDIA)    Difficulty of Paying Living Expenses: Not hard at all   Food Insecurity: No Food Insecurity (10/07/2022)   Hunger Vital Sign    Worried About Running Out of Food in the Last Year: Never true    Ran Out of Food in the Last Year: Never true  Transportation Needs: No Transportation Needs (10/07/2022)   PRAPARE - Administrator, Civil Service (Medical): No    Lack of Transportation (Non-Medical): No  Physical Activity: Not on file  Stress: Not on file  Social Connections: Unknown (10/02/2021)   Received from [facility]   Social Network    Social Network: Not on file  Intimate Partner Violence: Not At Risk (10/07/2022)   Humiliation, Afraid, Rape, and Kick questionnaire    Fear of Current or Ex-Partner: No    Emotionally Abused: No    Physically Abused: No    Sexually Abused: No     Allergies  Allergen Reactions   Other Anaphylaxis   Ace Inhibitors Cough    LISINOPRIL   Antihistamines, Diphenhydramine -Type Other (See Comments)    Other reaction(s): Unknown     Outpatient Medications Prior to Visit  Medication Sig Dispense Refill   Abatacept  (ORENCIA  IV) Inject into the vein. Gets every 4 weeks for awhile and then goes to monthly infusions     albuterol (VENTOLIN HFA) 108 (90 Base) MCG/ACT inhaler Inhale 1-2 puffs into the lungs every 6 (six) hours as needed for wheezing or shortness of breath.     amLODipine  (NORVASC ) 2.5 MG tablet Take 2.5 mg by mouth daily. (Patient taking differently: Take 10 mg by mouth daily.)     aspirin  81 MG chewable tablet Chew 81 mg by mouth daily.     atorvastatin  (LIPITOR) 80 MG tablet TAKE 1 TABLET BY MOUTH EVERY DAY 90 tablet 3   carbidopa -levodopa  (SINEMET  IR) 25-100 MG tablet Take 1 tablet by mouth 3 (three) times daily. TAKE 1 TABLET 3 TIMES A DAYWITH WATER  30 MINUTES      BEFORE MEALS 90 tablet 5   carvedilol  (COREG ) 6.25 MG tablet TAKE 1 TABLET BY MOUTH TWICE A DAY 180 tablet 3   Cholecalciferol  50 MCG (2000 UT) TABS Take 2,000 Units by mouth daily.     clonazePAM  (KLONOPIN ) 0.5 MG tablet  At bedtime 1 tab and if needed add a half tab later in the night. REM BD 45 tablet 5   fluticasone  (FLONASE ) 50 MCG/ACT nasal spray Place 1 spray into both nostrils daily as needed for rhinitis (drainage issues.).  KLOR-CON  M10 10 MEQ tablet Take 10 mEq by mouth daily.     leflunomide (ARAVA) 20 MG tablet Take 20 mg by mouth daily.     losartan  (COZAAR ) 100 MG tablet Take 100 mg by mouth at bedtime.     memantine  (NAMENDA ) 10 MG tablet Take 1 tablet (10 mg total) by mouth 2 (two) times daily. 180 tablet 1   montelukast (SINGULAIR) 10 MG tablet Take 10 mg by mouth every evening.     SYNJARDY XR 09-998 MG TB24 Take 1 tablet by mouth daily.     No facility-administered medications prior to visit.    Review of systems: No chest pain exertion orthopnea or PND comprehensive review systems otherwise negative.   Objective:  Physical Exam Vitals reviewed.  Constitutional:      General: He is not in acute distress.    Appearance: He is well-developed.  HENT:     Head: Normocephalic and atraumatic.  Eyes:     General: No scleral icterus.    Conjunctiva/sclera: Conjunctivae normal.     Pupils: Pupils are equal, round, and reactive to light.  Neck:     Vascular: No JVD.     Trachea: No tracheal deviation.  Cardiovascular:     Rate and Rhythm: Normal rate and regular rhythm.     Heart sounds: Normal heart sounds. No murmur heard. Pulmonary:     Effort: Pulmonary effort is normal. No tachypnea, accessory muscle usage or respiratory distress.     Breath sounds: No stridor. No wheezing, rhonchi or rales.  Abdominal:     General: There is no distension.     Palpations: Abdomen is soft.     Tenderness: There is no abdominal tenderness.  Musculoskeletal:        General: No tenderness.     Cervical back: Neck supple.  Lymphadenopathy:     Cervical: No cervical adenopathy.  Skin:    General: Skin is warm and dry.     Capillary Refill: Capillary refill takes less than 2 seconds.      Findings: No rash.  Neurological:     Mental Status: He is alert and oriented to person, place, and time.  Psychiatric:        Behavior: Behavior normal.      Vitals:   12/10/23 1314  BP: 125/73  Pulse: 64  Temp: (!) 97.4 F (36.3 C)  TempSrc: Temporal  SpO2: 96%  Weight: 178 lb (80.7 kg)  Height: 5' 10 (1.778 m)   96% on RA BMI Readings from Last 3 Encounters:  12/10/23 25.54 kg/m  10/10/23 25.11 kg/m  08/29/23 25.97 kg/m   Wt Readings from Last 3 Encounters:  12/10/23 178 lb (80.7 kg)  10/10/23 175 lb (79.4 kg)  08/29/23 181 lb (82.1 kg)     CBC    Component Value Date/Time   WBC 5.4 10/08/2022 0741   RBC 3.96 (L) 10/08/2022 0741   HGB 11.5 (L) 10/08/2022 0741   HGB 13.5 07/11/2022 1023   HCT 36.5 (L) 10/08/2022 0741   HCT 41.5 07/11/2022 1023   PLT 205 10/08/2022 0741   PLT 199 07/11/2022 1023   MCV 92.2 10/08/2022 0741   MCV 91 07/11/2022 1023   MCH 29.0 10/08/2022 0741   MCHC 31.5 10/08/2022 0741   RDW 12.5 10/08/2022 0741   RDW 11.9 07/11/2022 1023   LYMPHSABS 0.5 (L) 10/07/2022 0120   LYMPHSABS 0.7 07/11/2022 1023   MONOABS 1.2 (H) 10/07/2022 0120   EOSABS 0.0 10/07/2022 0120  EOSABS 0.1 07/11/2022 1023   BASOSABS 0.0 10/07/2022 0120   BASOSABS 0.0 07/11/2022 1023    Chest Imaging:  CT chest 08/03/2021: Scattered groundglass nodular densities within the lungs.  Suspect small areas of aspiration.  Also has a larger density that corresponds in the superior right middle lobe at approximately 1.8 cm. The patient's images have been independently reviewed by me.    CT chest 12/07/2021: Stable groundglass subsolid lesion within the right lung. The patient's images have been independently reviewed by me.    CT chest 11/2022: Decrease in size of the groundglass lesion, scattered tree-in-bud in the right lower lobe consistent mucus impaction with likely intermittent aspiration    Pulmonary Functions Testing Results:     No data to display          FeNO:   Pathology:   Echocardiogram:   Heart Catheterization:     Assessment & Plan:     ICD-10-CM   1. Lung nodule  R91.1 CT Super D Chest Wo Contrast    2. Mild intermittent asthma without complication  J45.20        Discussion:  This is a 78 y.o. gentleman, right lung nodule, groundglass opacity, history of primary Parkinson's disease, former smoker.  Little tiny area within the lung that decreased in size over time.  It does appear that is on a fissure line.  Looks more like a plaque or inflammatory lesion in nature versus a malignancy  He has minimal asthma symptoms.  Use albuterol rarely.  This is encouraging.  Plan: --Repeat CT scan now, then plan for yearly CT scan for 5 years per Fleischner criteria despite decrease in size after shared decision making  -- Continue albuterol as needed   Current Outpatient Medications:    Abatacept  (ORENCIA  IV), Inject into the vein. Gets every 4 weeks for awhile and then goes to monthly infusions, Disp: , Rfl:    albuterol (VENTOLIN HFA) 108 (90 Base) MCG/ACT inhaler, Inhale 1-2 puffs into the lungs every 6 (six) hours as needed for wheezing or shortness of breath., Disp: , Rfl:    amLODipine  (NORVASC ) 2.5 MG tablet, Take 2.5 mg by mouth daily. (Patient taking differently: Take 10 mg by mouth daily.), Disp: , Rfl:    aspirin  81 MG chewable tablet, Chew 81 mg by mouth daily., Disp: , Rfl:    atorvastatin  (LIPITOR) 80 MG tablet, TAKE 1 TABLET BY MOUTH EVERY DAY, Disp: 90 tablet, Rfl: 3   carbidopa -levodopa  (SINEMET  IR) 25-100 MG tablet, Take 1 tablet by mouth 3 (three) times daily. TAKE 1 TABLET 3 TIMES A DAYWITH WATER  30 MINUTES      BEFORE MEALS, Disp: 90 tablet, Rfl: 5   carvedilol  (COREG ) 6.25 MG tablet, TAKE 1 TABLET BY MOUTH TWICE A DAY, Disp: 180 tablet, Rfl: 3   Cholecalciferol  50 MCG (2000 UT) TABS, Take 2,000 Units by mouth daily., Disp: , Rfl:    clonazePAM  (KLONOPIN ) 0.5 MG tablet, At bedtime 1 tab and if needed add  a half tab later in the night. REM BD, Disp: 45 tablet, Rfl: 5   fluticasone  (FLONASE ) 50 MCG/ACT nasal spray, Place 1 spray into both nostrils daily as needed for rhinitis (drainage issues.). , Disp: , Rfl:    KLOR-CON  M10 10 MEQ tablet, Take 10 mEq by mouth daily., Disp: , Rfl:    leflunomide (ARAVA) 20 MG tablet, Take 20 mg by mouth daily., Disp: , Rfl:    losartan  (COZAAR ) 100 MG tablet, Take 100 mg by mouth  at bedtime., Disp: , Rfl:    memantine  (NAMENDA ) 10 MG tablet, Take 1 tablet (10 mg total) by mouth 2 (two) times daily., Disp: 180 tablet, Rfl: 1   montelukast (SINGULAIR) 10 MG tablet, Take 10 mg by mouth every evening., Disp: , Rfl:    SYNJARDY XR 09-998 MG TB24, Take 1 tablet by mouth daily., Disp: , Rfl:    Donnice JONELLE Beals, MD [facility] 12/10/2023 1:36 PM    I spent 41 minutes in the care of the patient including face-to-face visit, review of records, coordination of care.

## 2023-12-10 NOTE — Patient Instructions (Signed)
 It is nice to meet you  I ordered a repeat CT scan  I will be in touch regarding the results  Asthma seems well-controlled, continue albuterol as needed  Return to clinic in 1 year or sooner as needed with Dr. Annella

## 2023-12-12 DIAGNOSIS — M0609 Rheumatoid arthritis without rheumatoid factor, multiple sites: Secondary | ICD-10-CM | POA: Diagnosis not present

## 2023-12-15 ENCOUNTER — Ambulatory Visit
Admission: RE | Admit: 2023-12-15 | Discharge: 2023-12-15 | Disposition: A | Discharge status: Home / Self Care | Source: Ambulatory Visit | Attending: Pulmonary Disease | Admitting: Pulmonary Disease

## 2023-12-15 DIAGNOSIS — R911 Solitary pulmonary nodule: Secondary | ICD-10-CM

## 2023-12-18 ENCOUNTER — Ambulatory Visit: Payer: Self-pay | Admitting: Pulmonary Disease

## 2023-12-30 DIAGNOSIS — M8580 Other specified disorders of bone density and structure, unspecified site: Secondary | ICD-10-CM | POA: Diagnosis not present

## 2023-12-30 DIAGNOSIS — M1990 Unspecified osteoarthritis, unspecified site: Secondary | ICD-10-CM | POA: Diagnosis not present

## 2023-12-30 DIAGNOSIS — M25569 Pain in unspecified knee: Secondary | ICD-10-CM | POA: Diagnosis not present

## 2023-12-30 DIAGNOSIS — I428 Other cardiomyopathies: Secondary | ICD-10-CM | POA: Diagnosis not present

## 2023-12-30 DIAGNOSIS — Z79899 Other long term (current) drug therapy: Secondary | ICD-10-CM | POA: Diagnosis not present

## 2023-12-30 DIAGNOSIS — M0609 Rheumatoid arthritis without rheumatoid factor, multiple sites: Secondary | ICD-10-CM | POA: Diagnosis not present

## 2023-12-30 DIAGNOSIS — R768 Other specified abnormal immunological findings in serum: Secondary | ICD-10-CM | POA: Diagnosis not present

## 2023-12-30 DIAGNOSIS — M2550 Pain in unspecified joint: Secondary | ICD-10-CM | POA: Diagnosis not present

## 2023-12-31 ENCOUNTER — Encounter: Payer: Self-pay | Admitting: Podiatry

## 2023-12-31 ENCOUNTER — Ambulatory Visit: Admitting: Podiatry

## 2023-12-31 DIAGNOSIS — M79675 Pain in left toe(s): Secondary | ICD-10-CM

## 2023-12-31 DIAGNOSIS — N1831 Chronic kidney disease, stage 3a: Secondary | ICD-10-CM

## 2023-12-31 DIAGNOSIS — M79674 Pain in right toe(s): Secondary | ICD-10-CM | POA: Diagnosis not present

## 2023-12-31 DIAGNOSIS — B351 Tinea unguium: Secondary | ICD-10-CM

## 2023-12-31 DIAGNOSIS — G20C Parkinsonism, unspecified: Secondary | ICD-10-CM

## 2023-12-31 NOTE — Progress Notes (Signed)
 This patient presents to the office with chief complaint of long thick painful nails.  Patient says the nails are painful walking and wearing shoes.  This patient is unable to self treat.  This patient is unable to trim his nails since he is unable to reach his nails.  He has been diagnosed with Parkinsons. and type 2 diabetes.  he presents to the office for preventative foot care services.  General Appearance  Alert, conversant and in no acute stress.  Vascular  Dorsalis pedis and posterior tibial  pulses are palpable  bilaterally.  Capillary return is within normal limits  bilaterally. Temperature is within normal limits  bilaterally.  Neurologic  Senn-Weinstein monofilament wire test within normal limits  bilaterally. Muscle power within normal limits bilaterally.  Nails Thick disfigured discolored nails with subungual debris  from hallux to fifth toes bilaterally. No evidence of bacterial infection or drainage bilaterally.  Orthopedic  No limitations of motion  feet .  No crepitus or effusions noted.  No bony pathology or digital deformities noted.  Skin  normotropic skin with no porokeratosis noted bilaterally.  No signs of infections or ulcers noted.     Onychomycosis  Nails  B/L.  Pain in right toes  Pain in left toes  Debridement of nails both feet followed trimming the nails with dremel tool.    RTC  3  months.   Cordella Bold DPM

## 2024-01-06 DIAGNOSIS — E7800 Pure hypercholesterolemia, unspecified: Secondary | ICD-10-CM | POA: Diagnosis not present

## 2024-01-06 DIAGNOSIS — E118 Type 2 diabetes mellitus with unspecified complications: Secondary | ICD-10-CM | POA: Diagnosis not present

## 2024-01-06 DIAGNOSIS — E1169 Type 2 diabetes mellitus with other specified complication: Secondary | ICD-10-CM | POA: Diagnosis not present

## 2024-01-06 DIAGNOSIS — I428 Other cardiomyopathies: Secondary | ICD-10-CM | POA: Diagnosis not present

## 2024-01-06 DIAGNOSIS — I10 Essential (primary) hypertension: Secondary | ICD-10-CM | POA: Diagnosis not present

## 2024-01-09 DIAGNOSIS — M0609 Rheumatoid arthritis without rheumatoid factor, multiple sites: Secondary | ICD-10-CM | POA: Diagnosis not present

## 2024-02-06 DIAGNOSIS — M0609 Rheumatoid arthritis without rheumatoid factor, multiple sites: Secondary | ICD-10-CM | POA: Diagnosis not present

## 2024-02-11 ENCOUNTER — Ambulatory Visit (INDEPENDENT_AMBULATORY_CARE_PROVIDER_SITE_OTHER): Payer: Medicare Other | Admitting: Neurology

## 2024-02-11 ENCOUNTER — Encounter: Payer: Self-pay | Admitting: Neurology

## 2024-02-11 VITALS — BP 122/75 | HR 76 | Ht 71.0 in | Wt 177.0 lb

## 2024-02-11 DIAGNOSIS — G3184 Mild cognitive impairment, so stated: Secondary | ICD-10-CM | POA: Diagnosis not present

## 2024-02-11 DIAGNOSIS — G4752 REM sleep behavior disorder: Secondary | ICD-10-CM | POA: Diagnosis not present

## 2024-02-11 DIAGNOSIS — Z23 Encounter for immunization: Secondary | ICD-10-CM | POA: Diagnosis not present

## 2024-02-11 DIAGNOSIS — G20C Parkinsonism, unspecified: Secondary | ICD-10-CM | POA: Diagnosis not present

## 2024-02-11 DIAGNOSIS — R269 Unspecified abnormalities of gait and mobility: Secondary | ICD-10-CM

## 2024-02-11 DIAGNOSIS — I428 Other cardiomyopathies: Secondary | ICD-10-CM

## 2024-02-11 DIAGNOSIS — R490 Dysphonia: Secondary | ICD-10-CM

## 2024-02-11 NOTE — Progress Notes (Signed)
 Provider:  Dedra Gores, MD    Primary Care Physician:  Clarice Nottingham, MD [address] [place] [address] [place] [address]     Referring Provider: Clarice Nottingham, Md [address] [place],  [place] [address]          Chief Complaint according to patient   Patient presents with:                HISTORY OF PRESENT ILLNESS:  Tyler Hayes is a 78 y.o. male patient who is here for revisit 02/11/2024 for  primary Parkinsons Disease, tremor and REM sleep disorder.  Denies RS. Has rheumatoid arthritis and multifocal joint pain. Dysphonia.   Todays visit started with a memory test by [facility]:  he has had his coffee, his life elixir, and keeps him regular.  Namenda  is used.  Aricept  was not tolerated 9(GI)  PD : Sinemet  tid. 25/ 100 mg.   Exercises regularly .   Chief concern according to patient :  None.     02/11/2024   10:27 AM 06/11/2023    9:49 AM 11/27/2022    9:35 AM 07/11/2022    9:40 AM 01/08/2022    9:30 AM  Montreal Cognitive Assessment   Visuospatial/ Executive (0/5) 4 4 4 4 4   Naming (0/3) 3 3 3 3 3   Attention: Read list of digits (0/2) 1 1 1 2 2   Attention: Read list of letters (0/1) 1 1 1 1 1   Attention: Serial 7 subtraction starting at 100 (0/3) 3 3 3 3 3   Language: Repeat phrase (0/2) 2 2 2 1 1   Language : Fluency (0/1) 1 1 1 1 1   Abstraction (0/2) 2 2 2 2 2   Delayed Recall (0/5) 2/5 1 2 1 1   Orientation (0/6) 6 6 6 6 6   Total 25 24 25 24 24      Echocardiogram 2025: Findings to suggest non-compaction (mid anteroseptal and inferoseptal  segments and apical segment). . Left ventricular ejection fraction, by  estimation, is 40 to 45%. The left ventricle has mildly decreased  function. The left ventricle demonstrates  regional wall motion abnormalities (see scoring diagram/findings for  description). There is mild left ventricular hypertrophy. Left ventricular  diastolic parameters are consistent with Grade I diastolic  dysfunction  (impaired relaxation). The average left   ventricular global longitudinal strain is -13.2 %. The global  longitudinal strain is abnormal.   2. Right ventricular systolic function is normal. The right ventricular  size is normal.   3. A small pericardial effusion is present. The pericardial effusion is  posterior to the left ventricle. There is no evidence of cardiac  tamponade.   4. The mitral valve is normal in structure. No evidence of mitral valve  regurgitation. No evidence of mitral stenosis.   5. The aortic valve is tricuspid. Aortic valve regurgitation is trivial.  Aortic valve sclerosis is present, with no evidence of aortic valve  stenosis.   Comparison(s): No prior Echocardiogram.   FINDINGS   Left Ventricle: Findings to suggest non-compaction (mid anteroseptal and  inferoseptal segments and apical segment). Left ventricular ejection  fraction, by estimation, is 40 to 45%. The left ventricle has mildly  decreased function. The left ventricle  demonstrates regional wall motion abnormalities. The average left  ventricular global longitudinal strain is -13.2 %.  Strain was performed  and the global longitudinal strain is abnormal.  The left ventricular  internal cavity size was normal  in size. There  is mild left ventricular hypertrophy. Left ventricular diastolic  parameters are consistent with Grade I diastolic dysfunction (impaired  relaxation).     LV Wall Scoring:  The entire septum is hypokinetic.  Right Ventricle:  Pericardium: A small pericardial effusion is present. The pericardial  effusion is posterior to the left ventricle. There is no evidence of  cardiac tamponade.    LEFT VENTRICLE  PLAX 2D  LVIDd:         4.85 cm   Diastology  LVIDs:         3.60 cm   LV e' medial:    4.90 cm/s  LV PW:         0.90 cm   LV E/e' medial:  12.1  LV IVS:        1.40 cm   LV e' lateral:   5.00 cm/s  LVOT diam:     2.00 cm   LV E/e' lateral: 11.8  3D Volume  EF:                           3D EF:        47 %                           Sunit Tolia  Electronically signed by Madonna Large  Signature Date/Time: 10/16/2023/4:15:57 PM      MRI brain : 2024. No acute intracranial process. 2. Disproportionate prominence of the ventricles relative to the sulci with elevated Evans index and acute callosal angle, which can be seen in the setting of idiopathic normal pressure hydrocephalus.        Tyler Hayes is a 78 y.o. male patient who is here for revisit 11/27/2022 for  PD follow-up. He also has rheumatoid arthritis.     Chief concern according to patient :  Parkinson disease dx in 2017-18, first manifested as REM BD. He noted this time he had taken a shower,  was in heat and humidity and felt weak. He felt too weak to safely leave the home, walked into the bedroom, wanted to sit down and missed the seat- fell to the floor.  EMS called as he could not rise and his wife cannot lift him either-, hospitalized for 2 days with pneumonia.  10-06-2022 in ED - admitted on 5-21 until 5-23.    Tyler Hayes is a 78 y.o. male.   The history is provided by the patient and medical records.    Interval decrease in size of the previously demonstrated ground-glass opacity in the right middle lobe, now with a linear configuration, compatible with a small area of scarring. 2. Interval development of multiple ill-defined areas of ground-glass opacity in the right lower lobe, some with an areas of tree in bud densities that were previously present. These findings are most consistent with an infectious or inflammatory process. 3. Mild calcific coronary artery and aortic atherosclerosis.   Weakness   78 year old male with history of hypertension, essential tremor, rheumatoid arthritis, hyperlipidemia, NPH, Parkinson's disease, chronic kidney disease, presenting to the ED with generalized weakness.  Sustained a fall at home 4 days ago in which she got out  of the shower and his legs became weak and gave out on him.  He was attempting to sit down on the toilet but missed and slid to the floor.  He did strike his head on a nearby  chair but there was no loss of consciousness.  Wife was able to eventually get him up, however has been increasingly weak since that time.  He describes it as I feel like I do not have any strength.  His appetite has also been poor, neurology did adjust some of his medications as they felt like that may have been contributing but has still not been eating and drinking very much.  He has not had any vomiting or diarrhea.  He denies any current urinary symptoms.  He denies any focal numbness or weakness.  He has not had any further falls.  Wife has been trying to get him to use a walker over the past few days, however but still only able to take a few steps before his legs give out from weakness, generally ambulates unassisted.'   Tyler Hayes is a pleasant 78 y.o. male with medical history significant for Parkinson disease, mild cognitive impairment, hypertension, hyperlipidemia, and elevated coronary calcium  score who presents to the emergency department with generalized weakness and fatigue.    Review of Systems: Out of a complete 14 system review, the patient complains of only the following symptoms, and all other reviewed systems are negative.:   Lightheadedness  Cognitive MCI   How likely are you to doze in the following situations: 0 = not likely, 1 = slight chance, 2 = moderate chance, 3 = high chance  Sitting and Reading? Watching Television? Sitting inactive in a public place (theater or meeting)? Lying down in the afternoon when circumstances permit? Sitting and talking to someone? Sitting quietly after lunch without alcohol? In a car, while stopped for a few minutes in traffic? As a passenger in a car for an hour without a break?  Total = 9-24        Social History   Socioeconomic History    Marital status: Married    Spouse name: Not on file   Number of children: 3   Years of education: Not on file   Highest education level: Not on file  Occupational History   Not on file  Tobacco Use   Smoking status: Former    Current packs/day: 0.00    Average packs/day: 1.5 packs/day for 20.0 years (30.0 ttl pk-yrs)    Types: Cigarettes    Start date: 02/01/1971    Quit date: 02/01/1991    Years since quitting: 33.0   Smokeless tobacco: Never  Vaping Use   Vaping status: Never Used  Substance and Sexual Activity   Alcohol use: Yes    Comment: occasionally   Drug use: No   Sexual activity: Not on file  Other Topics Concern   Not on file  Social History Narrative   Pt lives with  wife    Pt retired    Chief Executive Officer Drivers of Corporate investment banker Strain: Low Risk  (01/27/2020)   Received from [employer]   Overall Financial Resource Strain (CARDIA)    Difficulty of Paying Living Expenses: Not hard at all  Food Insecurity: No Food Insecurity (10/07/2022)   Hunger Vital Sign    Worried About Running Out of Food in the Last Year: Never true    Ran Out of Food in the Last Year: Never true  Transportation Needs: No Transportation Needs (10/07/2022)   PRAPARE - Administrator, Civil Service (Medical): No    Lack of Transportation (Non-Medical): No  Physical Activity: Not on file  Stress: Not on file  Social  Connections: Unknown (10/02/2021)   Received from [facility]   Social Network    Social Network: Not on file    Family History  Problem Relation Age of Onset   Heart failure Mother    CAD Mother    Cancer Father    Parkinsonism Father    CAD Sister        Brain tumor   Stroke Sister    Stroke Sister    Coronary artery disease Sister    Hypertension Son    Diabetes Son    Obesity Son    Sleep apnea Son    Heart murmur Son        No major structural issues noted.   Alzheimer's disease Neg Hx    Dementia Neg Hx     Past Medical  History:  Diagnosis Date   Arthritis    back   Cancer (HCC)    prostate   Diabetes mellitus without complication (HCC)    on meds   Diverticulosis    Hypercholesteremia    Hypertension    Meatal stenosis    Osteoarthritis    Parkinson disease (HCC)    Sinus drainage    Tinea pedis     Past Surgical History:  Procedure Laterality Date   BACK SURGERY  1984, 2013   lumbar   COLONOSCOPY  06/10/2019   Dr. Dianna   DG DILATION URETERS  2003   EYE SURGERY Bilateral 1999   Lasik   LEFT HEART CATH AND CORONARY ANGIOGRAPHY N/A 05/26/2018   Procedure: LEFT HEART CATH AND CORONARY ANGIOGRAPHY;  Surgeon: Elmira Newman PARAS, MD;  Location: [facility] INVASIVE CV [facility];  Service: Cardiovascular;  Laterality: N/A;   POSTERIOR LUMBAR FUSION 4 LEVEL N/A 06/01/2014   Procedure: LUMBAR THREE TO FOUR, LUMBAR FOUR TO FIVE LAMINECTOMY,  RIGHT LUMBAR FUSION AT LUMBAR FOUR TO FIVE;  Surgeon: Catalina CHRISTELLA Stains, MD;  Location: [facility] NEURO ORS;  Service: Neurosurgery;  Laterality: N/A;  POSSIBLE L2-3 L3-4 L4-5 L5-S1 POSTERIOR LUMBAR INTERBODY FUSION   PROSTATECTOMY  2002     Current Outpatient Medications on File Prior to Visit  Medication Sig Dispense Refill   Abatacept  (ORENCIA  IV) Inject into the vein. Gets every 4 weeks for awhile and then goes to monthly infusions     albuterol (VENTOLIN HFA) 108 (90 Base) MCG/ACT inhaler Inhale 1-2 puffs into the lungs every 6 (six) hours as needed for wheezing or shortness of breath.     aspirin  81 MG chewable tablet Chew 81 mg by mouth daily.     atorvastatin  (LIPITOR) 80 MG tablet TAKE 1 TABLET BY MOUTH EVERY DAY 90 tablet 3   carbidopa -levodopa  (SINEMET  IR) 25-100 MG tablet Take 1 tablet by mouth 3 (three) times daily. TAKE 1 TABLET 3 TIMES A DAYWITH WATER  30 MINUTES      BEFORE MEALS 90 tablet 5   carvedilol  (COREG ) 6.25 MG tablet TAKE 1 TABLET BY MOUTH TWICE A DAY 180 tablet 3   Cholecalciferol  50 MCG (2000 UT) TABS Take 2,000 Units by mouth daily.     clonazePAM   (KLONOPIN ) 0.5 MG tablet At bedtime 1 tab and if needed add a half tab later in the night. REM BD 45 tablet 5   fluticasone  (FLONASE ) 50 MCG/ACT nasal spray Place 1 spray into both nostrils daily as needed for rhinitis (drainage issues.).      KLOR-CON  M10 10 MEQ tablet Take 10 mEq by mouth daily.     leflunomide (ARAVA) 20 MG tablet Take  20 mg by mouth daily.     losartan  (COZAAR ) 100 MG tablet Take 100 mg by mouth at bedtime. (Patient taking differently: Take 100 mg by mouth daily.)     memantine  (NAMENDA ) 10 MG tablet Take 1 tablet (10 mg total) by mouth 2 (two) times daily. 180 tablet 1   montelukast (SINGULAIR) 10 MG tablet Take 10 mg by mouth every evening.     SYNJARDY XR 09-998 MG TB24 Take 1 tablet by mouth daily.     tadalafil (CIALIS) 20 MG tablet Take 20 mg by mouth daily as needed.     amLODipine  (NORVASC ) 10 MG tablet Take 10 mg by mouth daily.     No current facility-administered medications on file prior to visit.    Allergies  Allergen Reactions   Other Anaphylaxis   Ace Inhibitors Cough    LISINOPRIL   Antihistamines, Diphenhydramine -Type Other (See Comments)    Other reaction(s): Unknown     DIAGNOSTIC DATA (LABS, IMAGING, TESTING) - I reviewed patient records, labs, notes, testing and imaging myself where available.  Lab Results  Component Value Date   WBC 5.4 10/08/2022   HGB 11.5 (L) 10/08/2022   HCT 36.5 (L) 10/08/2022   MCV 92.2 10/08/2022   PLT 205 10/08/2022      Component Value Date/Time   NA 137 10/08/2022 0741   NA 143 07/11/2022 1023   K 3.9 10/08/2022 0741   CL 106 10/08/2022 0741   CO2 22 10/08/2022 0741   GLUCOSE 93 10/08/2022 0741   BUN 9 10/08/2022 0741   BUN 10 07/11/2022 1023   CREATININE 1.01 10/08/2022 0741   CALCIUM  8.9 10/08/2022 0741   PROT 6.9 10/08/2022 0741   PROT 6.7 07/11/2022 1023   ALBUMIN 3.0 (L) 10/08/2022 0741   ALBUMIN 4.6 07/11/2022 1023   AST 22 10/08/2022 0741   ALT 17 10/08/2022 0741   ALKPHOS 51 10/08/2022  0741   BILITOT 1.2 10/08/2022 0741   BILITOT 0.4 07/11/2022 1023   GFRNONAA >60 10/08/2022 0741   GFRAA 72 (L) 05/24/2014 1221   Lab Results  Component Value Date   CHOL 129 01/08/2023   HDL 50 01/08/2023   LDLCALC 68 01/08/2023   TRIG 46 01/08/2023   CHOLHDL 2.6 01/08/2023   No results found for: HGBA1C No results found for: VITAMINB12 No results found for: TSH  PHYSICAL EXAM:  Vitals:   02/11/24 1025  BP: 122/75  Pulse: 76   No data found. Body mass index is 24.69 kg/m.   Wt Readings from Last 3 Encounters:  02/11/24 177 lb (80.3 kg)  12/10/23 178 lb (80.7 kg)  10/10/23 175 lb (79.4 kg)     Ht Readings from Last 3 Encounters:  02/11/24 5' 11 (1.803 m)  12/10/23 5' 10 (1.778 m)  10/10/23 5' 10 (1.778 m)      General: The patient is awake, alert and appears not in acute distress and groomed. Head: Normocephalic, atraumatic.  Neck is supple.Mallampati 2 neck circumference:16.25,  Nasal airflow patent.   No Retrognathia - Full dentures.  Cardiovascular:  Regular rate and rhythm, without murmurs or carotid bruit, and without distended neck veins. Respiratory: Lungs are clear to auscultation. Skin:  Without evidence of edema, or rash Trunk: BMI is 25 again .  The patient's posture is still erect. He is cooperative, pleasant, slightly fatigued.     Neurologic exam : The patient is awake and alert, oriented to place and time.   Memory subjective described as mildly impaired,  unfocussed.   Attention span & concentration ability appears normal in conversation  His wife also does not feel that he has memory loss. Jaw tremor,no  titubation.  Speech is fluent, with dysphonia. Mild vocal cord tremor.   Mood and affect are appropriate, concerned. Reports some frustration.   Cranial nerves: Preserved sense of taste but loss of smell.  Pupils are equally reactive to light. Right eye status post cataract surgery.  Extraocular movements in vertical and  horizontal planes intact and without nystagmus.  Visual fields by finger perimetry are intact. He blinks frequently - no masked face!! Hearing to finger rub intact.  Facial sensation intact to fine touch.   Facial motor strength is symmetric and tongue and uvula move midline.   Quivering of the lips, no titubation. Shoulder shrug was symmetrical.  Motor exam:  Mr. Butler presents with cog-wheeling rigidity over the right biceps only, slight increase in tone at the right wrist.  There is a right hand resting tremor and action tremor noted.there is mild resting tremor in right hand , too.   None of these findings in the left hand.He noticed a change in his handwriting and that he has to concentrate on bringing food to his mouth.  Sensory:  Fine touch, pinprick and vibration were felt normal. Coordination: Rapid alternating movements in the fingers/hands was not slowed.   Finger-to-nose maneuver with right hand tremor, left dysmetria.   He feels clumsy- with handwriting changes.  Right hand dominant- see drawing on MOCA.   Gait and station: Patient walks without assistive device -he could rise with his arms crossed in front of his chest- and he walks slightly stooped, small steps and fragmented turns 4.5 steps .  Arm swing is reduced right more than left . He is stooped, and takes small steps.  he tries to look at his feet while walking. Stance is stable but wide based.    Romberg: swaying front and backwards.    Deep tendon reflexes: in the upper and lower extremities are symmetric and intact. ASSESSMENT AND PLAN :   78 y.o. year old male  here with:    1) Parkinson disease/ right dominant cogwheeling rigor, tremor bilaterally at rest but less in left hand ( less amplitude) .   2) Orthostatic:  light headedness.   3) REM BD   4 MCI/  stable - MOCA 24/ 25 / 30   5 Rheumatoid arthritis :  joint pain contributes to some movement restrictions.   Plan:   1) Ordered a DAT scan for Mr  Ptacek. I will consider upping the sinemet  dose in the next visit with a positive DAT scan-  he personally doesn't feel much difference on carbidopa  Levodopa .   Continue namenda   for MCI, patient has not been impaired  in driving.   He avoids high ways and poor visibility,night driving.   Next visit in 6 months , with orthostatic BP and HR measures.    Any patient with sleepiness should be cautioned not to drive, work at heights, or operate dangerous or heavy equipment when feeling tired or sleepy.      The patient will be seen in follow-up in the sleep clinic at [facility] for discussion of test results, sleep related symptoms and treatment compliance review, further management strategies, etc.   The referring provider will be notified of the test results.  I would like to thank Clarice Nottingham, MD  for allowing me to meet with this pleasant patient.    The  patient's condition requires frequent monitoring and adjustments in the treatment plan, reflecting the ongoing complexity of care.  This provider is the continuing focal point for all needed services for this condition.  After spending a total time of  40  minutes face to face and time for  history taking, physical and neurologic examination, review of laboratory studies,  personal review of imaging studies, reports and results of other testing and review of referral information / records as far as provided in visit,   Electronically signed by: Dedra Gores, MD 02/11/2024 11:04 AM  [facility] Board certified by The [employer] of Sleep Medicine and Diplomate of the [employer] of Sleep Medicine. Board certified In Neurology through the ABPN, Fellow of the [employer] of Neu[employer].

## 2024-02-11 NOTE — Patient Instructions (Signed)
 78 y.o. year old male  here with:    1) Parkinson disease/ right dominant cogwheeling rigor, tremor bilaterally at rest but less in left hand ( less amplitude) .   2) Orthostatic:  light headedness.   3) REM BD   4 MCI/  stable - MOCA 24/ 25 / 30   5 Rheumatoid arthritis :  joint pain contributes to some movement restrictions.   Plan:   1) Ordered a DAT scan for Tyler Hayes. I will consider upping the sinemet  dose in the next visit with a positive DAT scan-  he personally doesn't feel much difference on carbidopa  Levodopa .   Continue namenda   for MCI, patient has not been impaired  in driving.   He avoids high ways and poor visibility,night driving.   Next visit in 6 months , with orthostatic BP and HR measures.    Any patient with sleepiness should be cautioned not to drive, work at heights, or operate dangerous or heavy equipment when feeling tired or sleepy.      The patient will be seen in follow-up in the sleep clinic at [facility] for discussion of test results, sleep related symptoms and treatment compliance review, further management strategies, etc.   The referring provider will be notified of the test results.  I would like to thank Clarice Nottingham, MD  for allowing me to meet with this pleasant patient.    The patient's condition requires frequent monitoring and adjustments in the treatment plan, reflecting the ongoing complexity of care.  This provider is the continuing focal point for all needed services for this condition.

## 2024-02-18 DIAGNOSIS — Z23 Encounter for immunization: Secondary | ICD-10-CM | POA: Diagnosis not present

## 2024-02-27 ENCOUNTER — Encounter (HOSPITAL_COMMUNITY)
Admission: RE | Admit: 2024-02-27 | Discharge: 2024-02-27 | Disposition: A | Discharge status: Home / Self Care | Source: Ambulatory Visit | Attending: Neurology | Admitting: Neurology

## 2024-02-27 ENCOUNTER — Encounter (HOSPITAL_COMMUNITY): Payer: Self-pay

## 2024-02-27 DIAGNOSIS — G4752 REM sleep behavior disorder: Secondary | ICD-10-CM | POA: Diagnosis present

## 2024-02-27 DIAGNOSIS — I428 Other cardiomyopathies: Secondary | ICD-10-CM | POA: Diagnosis present

## 2024-02-27 DIAGNOSIS — R269 Unspecified abnormalities of gait and mobility: Secondary | ICD-10-CM | POA: Insufficient documentation

## 2024-02-27 DIAGNOSIS — G3184 Mild cognitive impairment, so stated: Secondary | ICD-10-CM | POA: Diagnosis present

## 2024-02-27 DIAGNOSIS — G20C Parkinsonism, unspecified: Secondary | ICD-10-CM | POA: Insufficient documentation

## 2024-02-27 DIAGNOSIS — R2681 Unsteadiness on feet: Secondary | ICD-10-CM | POA: Diagnosis not present

## 2024-02-27 DIAGNOSIS — R490 Dysphonia: Secondary | ICD-10-CM | POA: Insufficient documentation

## 2024-02-27 DIAGNOSIS — G479 Sleep disorder, unspecified: Secondary | ICD-10-CM | POA: Diagnosis not present

## 2024-02-27 MED ORDER — POTASSIUM IODIDE (ANTIDOTE) 130 MG PO TABS
ORAL_TABLET | ORAL | Status: AC
  Filled 2024-02-27: qty 1

## 2024-02-27 MED ORDER — IOFLUPANE I 123 185 MBQ/2.5ML IV SOLN
5.0000 | Freq: Once | INTRAVENOUS | Status: AC | PRN
  Administered 2024-02-27: 4.3 via INTRAVENOUS
  Filled 2024-02-27: qty 5

## 2024-02-27 MED ORDER — IOFLUPANE I 123 185 MBQ/2.5ML IV SOLN
5.0000 | Freq: Once | INTRAVENOUS | Status: DC
  Filled 2024-02-27: qty 5

## 2024-03-02 ENCOUNTER — Ambulatory Visit: Payer: Self-pay | Admitting: Neurology

## 2024-03-05 DIAGNOSIS — M0609 Rheumatoid arthritis without rheumatoid factor, multiple sites: Secondary | ICD-10-CM | POA: Diagnosis not present

## 2024-03-09 NOTE — Telephone Encounter (Signed)
-----   Message from Farragut Dohmeier sent at 03/02/2024 10:44 PM EDT ----- Decreased right brain dopamine activity in comparison the left brain, explaining the symptoms of parkinson's disease affecting gait , voice and tremor.  Confirming a diagnosis of Parkinson's Disease-  this diagnosis has been suspected and the patient is treated for PD, he will remain on the medication  Carbi dopa /Levo dopa , 25/ 100 mg ,THREE TIMES  A DAY BEFORE MEALS until his revisit.  ----- Message ----- From: Interface, Rad Results In Sent: 03/02/2024   9:59 AM EDT To: Dedra Gores, MD

## 2024-03-09 NOTE — Telephone Encounter (Signed)
 Spoke with pt and discussed that DaTscan results do confirm a diagnosis of PD which has been suspected and he is being treated with C/L 25-100 mg tablet,take 1 tablet TID before meals. He is aware that he is continue this until his revisit on 08/10/24. He is to call us  if he has any changes before then. The patient verbalized understanding and appreciation for the call.

## 2024-03-12 DIAGNOSIS — H11823 Conjunctivochalasis, bilateral: Secondary | ICD-10-CM | POA: Diagnosis not present

## 2024-03-15 ENCOUNTER — Telehealth: Payer: Self-pay | Admitting: Cardiology

## 2024-03-15 MED ORDER — CARVEDILOL 6.25 MG PO TABS: 6.2500 mg | ORAL_TABLET | Freq: Two times a day (BID) | ORAL | 2 refills | Status: AC

## 2024-03-15 NOTE — Telephone Encounter (Signed)
*  STAT* If patient is at the pharmacy, call can be transferred to refill team.   1. Which medications need to be refilled? (please list name of each medication and dose if known)   carvedilol  (COREG ) 6.25 MG tablet    2. Which pharmacy/location (including street and city if local pharmacy) is medication to be sent to? [id number] - [place], [place] - [address] AT [facility] [address] [facility] [address] Phone: [phone]  Fax: [phone]      3. Do they need a 30 day or 90 day supply? 90

## 2024-03-15 NOTE — Telephone Encounter (Signed)
 90 day refill [facility]ol  [facility]

## 2024-04-02 ENCOUNTER — Ambulatory Visit: Admitting: Podiatry

## 2024-04-02 DIAGNOSIS — M0609 Rheumatoid arthritis without rheumatoid factor, multiple sites: Secondary | ICD-10-CM | POA: Diagnosis not present

## 2024-04-07 DIAGNOSIS — Z961 Presence of intraocular lens: Secondary | ICD-10-CM | POA: Diagnosis not present

## 2024-04-07 DIAGNOSIS — E119 Type 2 diabetes mellitus without complications: Secondary | ICD-10-CM | POA: Diagnosis not present

## 2024-04-08 ENCOUNTER — Ambulatory Visit: Admitting: Podiatry

## 2024-04-08 ENCOUNTER — Encounter: Payer: Self-pay | Admitting: Podiatry

## 2024-04-08 DIAGNOSIS — B351 Tinea unguium: Secondary | ICD-10-CM | POA: Diagnosis not present

## 2024-04-08 DIAGNOSIS — N1831 Chronic kidney disease, stage 3a: Secondary | ICD-10-CM

## 2024-04-08 DIAGNOSIS — M79675 Pain in left toe(s): Secondary | ICD-10-CM | POA: Diagnosis not present

## 2024-04-08 DIAGNOSIS — M79674 Pain in right toe(s): Secondary | ICD-10-CM

## 2024-04-08 NOTE — Progress Notes (Signed)
 This patient presents to the office with chief complaint of long thick painful nails.  Patient says the nails are painful walking and wearing shoes.  This patient is unable to self treat.  This patient is unable to trim his nails since he is unable to reach his nails.  He has been diagnosed with Parkinsons. and type 2 diabetes.  he presents to the office for preventative foot care services.  General Appearance  Alert, conversant and in no acute stress.  Vascular  Dorsalis pedis and posterior tibial  pulses are palpable  bilaterally.  Capillary return is within normal limits  bilaterally. Temperature is within normal limits  bilaterally.  Neurologic  Senn-Weinstein monofilament wire test within normal limits  bilaterally. Muscle power within normal limits bilaterally.  Nails Thick disfigured discolored nails with subungual debris  from hallux to fifth toes bilaterally. No evidence of bacterial infection or drainage bilaterally.  Orthopedic  No limitations of motion  feet .  No crepitus or effusions noted.  No bony pathology or digital deformities noted.  Skin  normotropic skin with no porokeratosis noted bilaterally.  No signs of infections or ulcers noted.     Onychomycosis  Nails  B/L.  Pain in right toes  Pain in left toes  Debridement of nails both feet followed trimming the nails with dremel tool.    RTC  3  months.   Cordella Bold DPM

## 2024-04-26 ENCOUNTER — Ambulatory Visit: Admitting: Podiatry

## 2024-04-26 ENCOUNTER — Other Ambulatory Visit: Payer: Self-pay | Admitting: Neurology

## 2024-04-30 DIAGNOSIS — M0609 Rheumatoid arthritis without rheumatoid factor, multiple sites: Secondary | ICD-10-CM | POA: Diagnosis not present

## 2024-06-15 ENCOUNTER — Telehealth: Payer: Self-pay | Admitting: Neurology

## 2024-06-15 NOTE — Telephone Encounter (Signed)
 Pt's wife called stating that they are needing the yearly letter written up for them again for his Parkinson's. She states they can pick it up or it can be mailed to them. They would like a call when it's ready.

## 2024-06-16 NOTE — Telephone Encounter (Signed)
Letter printed, placed on MD desk for signature

## 2024-06-16 NOTE — Telephone Encounter (Signed)
 Letter singed by MD and placed up front for pt pick up. Wife  is aware.

## 2024-06-16 NOTE — Telephone Encounter (Signed)
 A Letter has been provided for the last 2 yrs, ok to provide again ?

## 2024-07-12 ENCOUNTER — Ambulatory Visit: Admitting: Podiatry

## 2024-08-10 ENCOUNTER — Ambulatory Visit: Admitting: Neurology

## 2024-08-30 ENCOUNTER — Ambulatory Visit: Admitting: Cardiology
